# Patient Record
Sex: Female | Born: 1945 | Race: White | Hispanic: No | Marital: Married | Smoking: Former smoker
Health system: Southern US, Community
[De-identification: ages and names within clinical notes are randomized; demographics above are authoritative.]

## PROBLEM LIST (undated history)

## (undated) DIAGNOSIS — I739 Peripheral vascular disease, unspecified: Secondary | ICD-10-CM

## (undated) DIAGNOSIS — K219 Gastro-esophageal reflux disease without esophagitis: Secondary | ICD-10-CM

## (undated) DIAGNOSIS — C50919 Malignant neoplasm of unspecified site of unspecified female breast: Secondary | ICD-10-CM

## (undated) DIAGNOSIS — I701 Atherosclerosis of renal artery: Secondary | ICD-10-CM

## (undated) DIAGNOSIS — T4145XA Adverse effect of unspecified anesthetic, initial encounter: Secondary | ICD-10-CM

## (undated) DIAGNOSIS — C801 Malignant (primary) neoplasm, unspecified: Secondary | ICD-10-CM

## (undated) DIAGNOSIS — R32 Unspecified urinary incontinence: Secondary | ICD-10-CM

## (undated) DIAGNOSIS — I10 Essential (primary) hypertension: Secondary | ICD-10-CM

## (undated) DIAGNOSIS — Z8489 Family history of other specified conditions: Secondary | ICD-10-CM

## (undated) DIAGNOSIS — J45909 Unspecified asthma, uncomplicated: Secondary | ICD-10-CM

## (undated) DIAGNOSIS — Z923 Personal history of irradiation: Secondary | ICD-10-CM

## (undated) DIAGNOSIS — E785 Hyperlipidemia, unspecified: Secondary | ICD-10-CM

## (undated) DIAGNOSIS — IMO0001 Reserved for inherently not codable concepts without codable children: Secondary | ICD-10-CM

## (undated) DIAGNOSIS — N189 Chronic kidney disease, unspecified: Secondary | ICD-10-CM

## (undated) DIAGNOSIS — T8859XA Other complications of anesthesia, initial encounter: Secondary | ICD-10-CM

## (undated) DIAGNOSIS — IMO0002 Reserved for concepts with insufficient information to code with codable children: Secondary | ICD-10-CM

## (undated) HISTORY — DX: Malignant neoplasm of unspecified site of unspecified female breast: C50.919

## (undated) HISTORY — PX: ILIAC ARTERY STENT: SHX1786

## (undated) HISTORY — PX: OTHER SURGICAL HISTORY: SHX169

## (undated) HISTORY — PX: BLADDER SUSPENSION: SHX72

## (undated) HISTORY — DX: Peripheral vascular disease, unspecified: I73.9

## (undated) HISTORY — DX: Atherosclerosis of renal artery: I70.1

## (undated) HISTORY — PX: COLONOSCOPY: SHX174

## (undated) HISTORY — DX: Reserved for concepts with insufficient information to code with codable children: IMO0002

## (undated) HISTORY — PX: TUBAL LIGATION: SHX77

## (undated) HISTORY — DX: Hyperlipidemia, unspecified: E78.5

---

## 2000-06-18 ENCOUNTER — Encounter: Payer: Self-pay | Admitting: *Deleted

## 2000-06-18 ENCOUNTER — Other Ambulatory Visit: Admission: RE | Admit: 2000-06-18 | Discharge: 2000-06-18 | Payer: Self-pay | Admitting: *Deleted

## 2000-06-18 ENCOUNTER — Ambulatory Visit (HOSPITAL_COMMUNITY): Admission: RE | Admit: 2000-06-18 | Discharge: 2000-06-18 | Payer: Self-pay | Admitting: *Deleted

## 2000-07-27 ENCOUNTER — Observation Stay (HOSPITAL_COMMUNITY): Admission: RE | Admit: 2000-07-27 | Discharge: 2000-07-28 | Payer: Self-pay | Admitting: Cardiovascular Disease

## 2000-07-27 ENCOUNTER — Encounter: Payer: Self-pay | Admitting: Cardiovascular Disease

## 2000-07-27 HISTORY — PX: OTHER SURGICAL HISTORY: SHX169

## 2000-12-03 ENCOUNTER — Ambulatory Visit (HOSPITAL_COMMUNITY): Admission: RE | Admit: 2000-12-03 | Discharge: 2000-12-03 | Payer: Self-pay | Admitting: Cardiovascular Disease

## 2000-12-10 ENCOUNTER — Ambulatory Visit (HOSPITAL_COMMUNITY): Admission: RE | Admit: 2000-12-10 | Discharge: 2000-12-11 | Payer: Self-pay | Admitting: Cardiovascular Disease

## 2000-12-10 HISTORY — PX: OTHER SURGICAL HISTORY: SHX169

## 2001-10-14 ENCOUNTER — Ambulatory Visit (HOSPITAL_COMMUNITY): Admission: RE | Admit: 2001-10-14 | Discharge: 2001-10-14 | Payer: Self-pay | Admitting: Family Medicine

## 2001-10-14 ENCOUNTER — Encounter: Payer: Self-pay | Admitting: Family Medicine

## 2002-02-22 ENCOUNTER — Encounter: Payer: Self-pay | Admitting: Cardiovascular Disease

## 2002-02-22 ENCOUNTER — Ambulatory Visit (HOSPITAL_COMMUNITY): Admission: RE | Admit: 2002-02-22 | Discharge: 2002-02-23 | Payer: Self-pay | Admitting: Cardiovascular Disease

## 2002-02-22 HISTORY — PX: OTHER SURGICAL HISTORY: SHX169

## 2005-05-16 ENCOUNTER — Encounter: Admission: RE | Admit: 2005-05-16 | Discharge: 2005-05-16 | Payer: Self-pay | Admitting: Cardiovascular Disease

## 2005-05-22 ENCOUNTER — Ambulatory Visit (HOSPITAL_COMMUNITY): Admission: RE | Admit: 2005-05-22 | Discharge: 2005-05-22 | Payer: Self-pay | Admitting: Cardiovascular Disease

## 2005-05-22 HISTORY — PX: OTHER SURGICAL HISTORY: SHX169

## 2006-01-15 ENCOUNTER — Ambulatory Visit (HOSPITAL_COMMUNITY): Admission: RE | Admit: 2006-01-15 | Discharge: 2006-01-15 | Payer: Self-pay | Admitting: Family Medicine

## 2006-01-29 ENCOUNTER — Ambulatory Visit (HOSPITAL_COMMUNITY): Admission: RE | Admit: 2006-01-29 | Discharge: 2006-01-29 | Payer: Self-pay | Admitting: Family Medicine

## 2006-07-28 ENCOUNTER — Ambulatory Visit (HOSPITAL_COMMUNITY): Admission: RE | Admit: 2006-07-28 | Discharge: 2006-07-28 | Payer: Self-pay | Admitting: Cardiovascular Disease

## 2006-08-04 ENCOUNTER — Inpatient Hospital Stay (HOSPITAL_COMMUNITY): Admission: RE | Admit: 2006-08-04 | Discharge: 2006-08-05 | Payer: Self-pay | Admitting: Cardiovascular Disease

## 2006-08-04 HISTORY — PX: OTHER SURGICAL HISTORY: SHX169

## 2007-06-11 ENCOUNTER — Ambulatory Visit (HOSPITAL_COMMUNITY): Admission: RE | Admit: 2007-06-11 | Discharge: 2007-06-11 | Payer: Self-pay | Admitting: Family Medicine

## 2009-09-27 ENCOUNTER — Ambulatory Visit (HOSPITAL_COMMUNITY): Admission: RE | Admit: 2009-09-27 | Discharge: 2009-09-27 | Payer: Self-pay | Admitting: Internal Medicine

## 2010-03-21 HISTORY — PX: NM MYOVIEW LTD: HXRAD82

## 2010-06-04 NOTE — Cardiovascular Report (Signed)
NAME:  Debra Henderson, Debra Henderson NO.:  [id number]   MEDICAL RECORD NO.:  [id number]          PATIENT TYPE:  INP   LOCATION:  6527                         FACILITY:  [facility]   PHYSICIAN:  Nanetta Batty, M.D.   DATE OF BIRTH:  1945/02/03   DATE OF PROCEDURE:  08/04/2006  DATE OF DISCHARGE:                            CARDIAC CATHETERIZATION   [name] is a 65 year old white female with history of normal  coronaries by catheterization, renal artery stenosis, as well as left  iliac disease status post PTA and stenting of the left renal and iliac  artery in 2004.  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  Her other problems include  tobacco abuse, hypertension.  Followup renal Dopplers performed Jun 10, 2006, suggested in-stent restenosis with a smaller left kidney.  She  presents now for renal intervention for renal preservation.   DESCRIPTION OF PROCEDURE:  The patient brought to the sixth floor [facility] PV angiographic suite in the postabsorptive state.  She was  premedicated with p.o. Valium, IV Versed and fentanyl.  The right groin  was prepped and shaved in the usual sterile fashion, and 1% Xylocaine  was used local anesthesia.  A 6-French upgraded to a 7-French sheath was  inserted in the right femoral artery using standard Seldinger technique.  A 5-French tennis racket catheter was used for midstream distal  abdominal aortography.  Visipaque dye was used for the entirety of the  case.  The aortic pressures monitored during the case.   ANGIOGRAPHIC RESULTS:  1. Abdominal aorta.  2. Renal arteries:      a.     Right renal widely patent.      b.     left renal 90% in-stent restenosis.  3. Infrarenal abdominal aorta normal.  4. Iliac stent widely patent.   DESCRIPTION OF PROCEDURE:  The patient received 3000 units of heparin  intravenously.  Using a 7-French JR-4 short guide catheter along with an  0.14, 190 stabilizing  wire and a 4 x 10 cutting balloon, atherectomy was  performed.  Following this, a Cypher 3.5 x 13 drug-eluting stent was  then deployed at 18 atmospheres and postdilated with a 45 x 12 Quantum  Maverick at 18 atmospheres (4.7 mm) resulting in reduction of 90% in-  stent restenosis to 0% residual.  Since this was the second episode of  restenosis, it was elected to use a drug-eluting stent in order to  potentially maximize the potential for patency.   IMPRESSION:  Successful cutting balloon atherectomy and percutaneous  transluminal angioplasty and stenting of in-stent restenosis: within  the left renal artery stent for renal preservation.  The patient  tolerated procedure well.  She received 300 mg of Plavix at the end of  the case.  Guidewire and catheter were removed.  The sheath was sewn  securely in place.  The patient left the laboratory in satisfactory  condition.  Sheath will be removed once ACT falls below 200.  The  patient will be hydrated overnight, discharged home in the morning.  She  will get  followup renal Dopplers, and I will see back in the office  after that for further evaluation.      Nanetta Batty, M.D.  Electronically Signed     JB/MEDQ  D:  08/04/2006  T:  08/04/2006  Job:  [id number]   cc:   2nd Fl [facility] PV Angiographic Suite  [facility]  Patrica Duel, M.D.

## 2010-06-04 NOTE — Discharge Summary (Signed)
NAME:  Debra Henderson, Debra Henderson NO.:  [id number]   MEDICAL RECORD NO.:  [id number]          PATIENT TYPE:  INP   LOCATION:  6527                         FACILITY:  [facility]   PHYSICIAN:  Nanetta Batty, M.D.   DATE OF BIRTH:  18-May-1945   DATE OF ADMISSION:  08/04/2006  DATE OF DISCHARGE:  08/05/2006                               DISCHARGE SUMMARY   [name] is a 65 year old female patient of Dr. Nanetta Batty who  has known as PCP.  She has a history of normal coronary arteries.  She  has had renal stenting with in-stent restenosis and left iliac artery  stenting in the past.  Apparently recently her blood pressures have been  increasing.  Her renal Dopplers showed elevated velocities.  Thus, she  came in as an outpatient for PV angiogram.  This revealed that she had  90% in-stent restenosis on her left renal artery.  She underwent  percutaneous intervention and restenting with a 3.5 x 13 Cypher stent.  Result was 99% down to less than 0%.  She was hydrated overnight.  Plavix was continued as prior to hospitalization and July 16 started.  She was seen by Dr. Tresa Endo and she was considered stable for discharge  home.   LABORATORIES:  Hemoglobin 11.2, hematocrit 32.6, platelets 239.  WBC  9.1.  Sodium 136, potassium 3.6, BUN 12, creatinine 0.79.  Glucose is  133.  Chloride was 107.  CO2 was 24.   She has been on Vytorin and Lipitor in the past without tolerance.  We  discussed adding Crestor 5 mg low dose.  She is in agreement with this.   She continues to smoke.  We discussed adding Chantix.  She refused this  because of information she had heard about side effects.   DISCHARGE MEDICATIONS:  1. Plavix 75 mg a day.  2. Aspirin 325 mg a day.  3. Avalide 150/12.5 a day.  4. Crestor 5 mg a day.   She will have followup renal Dopplers July 29 at 7:00 a.m.  She will  have a followup with Dr. Allyson Sabal on August 4 at 10:15.   DISCHARGE DIAGNOSES:  1. Increasing hypertension  and elevated renal velocities with      subsequent recommendation for PV angiogram which was performed on      August 04, 2006 by Dr. Allyson Sabal showing 90% in-stent restenosis upper      left renal stent.  Her iliac stent on the left was patent.  She      underwent left percutaneous transluminal angioplasty and stenting      with a Cypher stent with good result.  2. Hyperlipidemia intolerant to Vytorin and Lipitor, started on      Crestor 5 mg every day.  3. Smoking.  This was discussed.  She refuses Chantix at this time.  4. Elevated glucose.  5. Hypertension.      [name], N.P.      Nanetta Batty, M.D.  Electronically Signed    BB/MEDQ  D:  08/05/2006  T:  08/05/2006  Job:  [id number]   cc:   Patrica Duel,  M.D. 

## 2010-06-07 NOTE — Discharge Summary (Signed)
NAME:  [name], [name] NO.:  [id number]   MEDICAL RECORD NO.:  [id number]                   PATIENT TYPE:  OIB   LOCATION:  6524                                 FACILITY:  [facility]   PHYSICIAN:  Nanetta Batty, M.D.                DATE OF BIRTH:  [birth year]   DATE OF ADMISSION:  02/22/2002  DATE OF DISCHARGE:  02/23/2002                                 DISCHARGE SUMMARY   ADMISSION DIAGNOSES:  1. Peripheral vascular disease.     a. Status post left common iliac artery percutaneous transluminal        angioplasty and stent.     b. Status post left renal artery percutaneous transluminal angioplasty        and stent.  2. Hypertension.  3. Hyperlipidemia.  4. Ongoing tobacco use.  5. Chest pain.  6. Recent Doppler consistent with in stent restenosis of left renal artery.   DISCHARGE DIAGNOSES:  1. Peripheral vascular disease.     a. Status post left common iliac artery percutaneous transluminal        angioplasty and stent.     b. Status post left renal artery percutaneous transluminal angioplasty        and stent.  2. Hypertension.  3. Hyperlipidemia.  4. Ongoing tobacco use.  5. Chest pain.  6. Recent Doppler consistent with in stent restenosis of left renal artery.  7. Cardiac catheterization, February 22, 2002, revealing normal coronary     arteries, normal LV function.  8. Status post peripheral vascular angiogram, February 22, 2002, revealing     95% in-stent restenosis of the left renal artery stent.  He performed PCI     successfully.  Previously placed left common iliac artery stent patent.   HISTORY OF PRESENT ILLNESS:  Debra Henderson is a 65 year old, mildly  overweight, white female with a history of PVD, hypertension,  hyperlipidemia, and ongoing tobacco use.  She has had a left common iliac  artery PTA and stent and left renal artery PTA and stent in the past.  Dr.  Allyson Sabal had not seen her for over a year.  When she came back to the  office on  February 18, 2002, she complained of exertional chest pain.  As well, recent  surveillance Doppler had findings consistent with in stent restenosis within  left renal artery stent with fairly normal ABIs.  On exam at that time she  was stable.  EKG showed nonspecific ST-T change.  At that point, Dr. Allyson Sabal  was concerned about her chest pain as well as restenosis of the left renal  artery; therefore, he planned for admission to [facility] in the following  week for diagnostic cardiac catheterization as well as abdominal aortography  to define her coronary anatomy and to potentially revascularize the left  kidney.  Risks and benefits of the procedure were discussed, and she was  willing to proceed.  HOSPITAL COURSE:  On February 22, 2002, Debra Henderson underwent cardiac  catheterization by Dr. Nanetta Batty.  She was found to have normal  coronary arteries and normal LV function.  On peripheral angiography she was  found to have 95% in stent restenosis of the left renal artery.  He  proceeded with angioplasty of this.  He successfully dilated this from 95%  to less than 20% residual.   On February 23, 2002, Debra Henderson remained stable.  She has had no  complications to her groin site.  She is hemodynamically stable with blood  pressure 100/60, heart rate 80.  She has no complaints, and her groin is  stable without hematoma or bleed.  At this time she __________  by Dr.  Nanetta Batty who deems her stable for discharge home.   HOSPITAL CONSULTS:  None.   HOSPITAL PROCEDURES:  1. Cardiac catheterization on February 22, 2002, with Dr. Nanetta Batty,     revealing normal coronary arteries, normal LV function.  2. Peripheral vascular angiography on February 22, 2002, with Dr. Nanetta Batty.  This revealed 95% in stent restenosis of the left renal artery     stent.  The previously placed left common iliac artery stent was patent.     He proceeded with PTA of the left renal  artery stent and this went from     95% to less than 20% residual.  She tolerated the procedure well with no     complications.   LABORATORY DATA:  Pre procedure labs were drawn as an outpatient and were  all stable.  Postprocedure labs on February 23, 2002, show sodium 139,  potassium 3.9, chloride 104, CO2 25, glucose 127, BUN 10, creatinine 0.9.  White count 8.9, hemoglobin 11.9, hematocrit 34.0, and platelet 242,000.   Telemetry was showing sinus rhythm with no arrhythmias.   DISCHARGE MEDICATIONS:  1. Plavix 75 mg once a day.  2. Aspirin 81 mg once a day.  3. Lipitor 10 mg once a day.  4. Avalide 150/12.5 once a day.  5. Wellbutrin SR 150 mg once a day.   DISCHARGE ACTIVITIES:  No strenuous activity, lift no greater than five  pounds, driving, or sexual activity for three days.   DISCHARGE DIET:  Low-salt, low-fat, low-cholesterol diet.   WOUND CARE:  1. May gently wash her groin site with warm water and soap.  2. Call [phone] for any bleeding or increased __________ of the groin site.    FOLLOW UP:  She is scheduled for follow-up carotid Dopplers as well as her  renal Dopplers on Friday, February 13, at 10:30 A.M. in the [place]  office.  She is scheduled to see Dr. Allyson Sabal March 10, at 10:00 A.M. in the  [place] office.     Mary B. Remer Macho, P.A.-C.                   Nanetta Batty, M.D.    MBE/MEDQ  D:  02/23/2002  T:  02/23/2002  Job:  [id number]   cc:   Nanetta Batty, M.D.  [address], [address]  [place]  [place] [address]  Fax: [phone]   Kirk Ruths, M.D.  [address]  [place]  [place] [address]  Fax: [phone]

## 2010-06-07 NOTE — Cardiovascular Report (Signed)
NAME:  Debra Henderson, Debra Henderson NO.:  [id number]   MEDICAL RECORD NO.:  [id number]                   PATIENT TYPE:  OIB   LOCATION:  2857                                 FACILITY:  [facility]   PHYSICIAN:  Nanetta Batty, M.D.                DATE OF BIRTH:  May 27, 1945   DATE OF PROCEDURE:  02/22/2002  DATE OF DISCHARGE:                              CARDIAC CATHETERIZATION   INDICATION:  The patient is a 65 year old white female with history of  peripheral vascular occlusive disease, hypertension, hyperlipidemia and  ongoing tobacco abuse.  She has had left renal and left common iliac artery  PTA and stenting.  She is complaining of chest pain.  She has had Dopplers  which suggest in-stent restenosis within the left renal artery stent.  She  presents now for cardiac catheterization, and abdominal aortography.   PROCEDURE DESCRIPTION:  The patient was brought to the second floor [facility] Coronary Cath Lab in a post-absorptive state.  She was premedicated  with p.o. Valium.  Her right groin was prepped and draped in the usual  sterile fashion.  Xylocaine 1% was used for local anesthesia.  A 6-French  sheath was inserted into the right femoral artery using standard Seldinger  technique.  The 6-French right and left Judkins diagnostic catheters along  with a 6-French pigtail catheter were used for selective coronary  angiography, left ventriculography, distal abdominal aortography and  selective left renal artery angiography.  Omnipaque dye was used for the  entirety of the case.  Retrograde aortic, ventricular and pullback pressures  were recorded.   HEMODYNAMICS:  Aortic systolic pressure 128, diastolic pressure 70.  Left  ventricular systolic pressure 130 and diastolic pressure 39.   SELECTIVE CORONARY ANGIOGRAPHY:  1. Left main normal.  2. LAD normal.  3. Left circumflex normal.  4. Right coronary artery is dominant and normal.   LEFT VENTRICULOGRAPHY:   RAO left ventriculogram was performed using 20 mL of  Omnipaque dye at 10 mL/sec.  The overall LVEF was estimated at greater than  60% without focal wall motion abnormalities.   DISTAL ABDOMINAL AORTOGRAPHY:  Performed twice using 20 mL of Omnipaque dye  at 20 mL/sec.  There appeared to be a high-grade in-stent left renal artery  stenosis.  The left common iliac artery stent was widely patent.   IMPRESSION:  Normal coronary arteries and normal left ventricular function.  The patient does have in-stent restenosis in the left renal artery stent.  We will proceed at this time with percutaneous transluminal angioplasty.   PROCEDURE DESCRIPTION:  The patient received 2500 units of heparin.  Selective angiography was performed via the left renal artery stent using a  6-French short right Judkins guide catheter.  A 0.014-inch support guidewire  was able to advance across the lesion and angioplasty was performed with the  5 x 15 Guidant rapid-exchange angioplasty balloon at 6 atmospheres,  resulting in reduction of a 95% in-stent restenosis to less than 20%  residual.  The patient did complain of some back pain, which quickly  resolved with balloon deflation.   OVERALL IMPRESSION:  Normal coronary arteries.  Normal left ventricular  function with successful percutaneous transluminal angioplasty of left renal  artery in-stent restenosis.  The patient tolerated the procedure well.  Activated clotting time was measured at greater than 200.  The guidewire and  catheters were removed and the  sheath was sewn securely in place.  Patient left the laboratory in stable  condition.  The sheath will be removed once the activated clotting time  falls below 200 and the patient will be discharged home in the morning.  She  left the laboratory in stable condition.                                               Nanetta Batty, M.D.    Cordelia Pen  D:  02/22/2002  T:  02/22/2002  Job:  [id number]   cc:   Second  Floor [facility] Cardiac Cath Lab   [address] [place] [place], [place]  [address] [facility]   Kirk Ruths, M.D.  [address]  [place]  [place] [address]  Fax: [phone]

## 2010-06-07 NOTE — Discharge Summary (Signed)
[place]  Patient:    Debra Henderson, Debra Henderson Visit Number: [id number] MRN: [id number]          Service Type: DSU Location: [phone] Attending Physician:  Berry, Jonathan [place] Dictated by:   [facility] [place], [place]. Admit Date:  12/10/2000 Discharge Date: 12/11/2000   CC:         [facility], [place]   Discharge Summary  ADMISSION DIAGNOSES: 1. Mild obesity. 2. Hypertension. 3. Hyperlipidemia. 4. Tobacco use. 5. History of angiography, July 27, 2000, revealing 95% segmental left common    iliac stenosis which was stented.  Also at angiography, she had bilateral    50% segmental, 50% mid superficial femoral artery stenosis, and    three-vessel runoff.  Also, that angiography showed a 95% ostial left renal    artery stenosis. 6. Known left renal artery stenosis by previous angiogram and worsening by    recent _______. 7. Status post Cardiolite November 10, 2000 which shows normal profusion,    ejection fraction 77%.  DISCHARGE DIAGNOSES: 1. Mild obesity. 2. Hypertension. 3. Hyperlipidemia. 4. Tobacco use. 5. History of angiography, July 27, 2000, revealing 95% segmental left common    iliac stenosis which was stented.  Also at angiography, she had bilateral    50% segmental, 50% mid superficial femoral artery stenosis, and    three-vessel runoff.  Also, that angiography showed a 95% ostial left renal    artery stenosis. 6. Known left renal artery stenosis by previous angiogram and worsening by    recent _______. 7. Status post Cardiolite November 10, 2000 which shows normal profusion,    ejection fraction 77%. 8. Status post peripheral vascular angiogram December 10, 2000 by Dr. Nanetta Batty which revealed a left renal artery stenosis of 90%.  It also revealed    80% left common iliac artery stenosis.  He performed percutaneous    transluminal angioplasty stent to both of these lesions with 0% residual in    both of  these at the end of the case.  HISTORY OF PRESENT ILLNESS:  The patient is a 65 year old, moderately overweight, white female with a history of hypertension, hyperlipidemia, and tobacco use.  She had been complaining of left lower extremity claudication and underwent angiography on July 27, 2000 revealing long 95% segmental left common iliac stenosis which was stented.  As well, she was found to have bilateral 50% segmental mid SFA stenoses with three-vessel runoff.  She was incidentally noted to have 95% ostial left renal artery stenosis.  Subsequent follow-up noninvasive studies revealed improvement in her ABIs.  Her claudication had completely resolved.  Renal Doppler studies showed left renal dimension of 8.9 cm which was 2 cm smaller than the right with a systolic velocity of 378.  At that time, it was felt that she had critical left renal artery stenosis though the blood pressure was well-controlled.  It was felt by Dr. Allyson Sabal that for renal preservation she should undergo percutaneous renal revascularization.  Though she denied having any chest pain or shortness of breath, he planned to obtain an exercise Cardiolite prior to angiography to rule out silent ischemic heart disease given her known significant PVD.  She would then be scheduled for renal intervention following her Cardiolite.  She subsequently underwent Cardiolite on November 10, 2000 which showed normal profusion and EF 77%.  HOSPITAL COURSE:  On December 10, 2000, the patient underwent peripheral vascular angiography by Dr. Nanetta Batty.  Please see his  dictated report for further details.  This is unavailable at this time of dictation.  She was found to have a left renal artery stenosis of 90%.  He performed PTA stenting and had a 0% residual stenosis at that site.  She also had an 80% left common iliac artery stenosis.  He performed PTA stent to this and also had a 0% residual at the end of case.  No complications.   He tolerated the procedure well.  On December 11, 2000, the patient remained stable.  She has no complaints. She is afebrile at 97%, pulse 80, blood pressure 95/60, and oxygen saturation 95% on room air.  Post-procedure labs are all stable with a creatinine at 0.9 post dye.  Her lungs are clear.  Heart is a regular rhythm.  Her right groin has no ecchymosis, no hematoma, and no bruit.  Left groin has a slight amount of ecchymosis but no hematoma or no bruit, and her peripheral pulses are equal bilaterally.  At this time, she is seen and evaluated by Dr. Chanda Busing who deems her stable for discharge home.  Of note, her lipid profile that was drawn with her pre-procedure labs revealed an LDL of 156.  She is currently on no lipid medication.  So, Lipitor 10 mg will be added to her discharge medications.  HOSPITAL CONSULTS:  None.  HOSPITAL PROCEDURE:  Peripheral vascular angiography on December 10, 2000, by Dr. Nanetta Batty.  She was found to have left renal artery stenosis of 90%. He performed PTA stent and had a 0% residual at the end of case.  As well, she was found to have an 80% left common iliac artery stenosis.  Again, he performed PTA stent, and at the end of case, she had a 0% residual at the site.  She tolerated the procedure well without complications.  Please see his dictated report for further detail as it is unavailable to me at this time of dictation.  DISCHARGE LABORATORY STUDIES:  Labs on December 11, 2000 showed a white count of 9.2, hemoglobin 11.5, hematocrit 33.5, platelets 260.  Sodium 142, potassium 4.0, BUN 10, creatinine 0.9, glucose 108.  Of note, her outpatient labs that had been drawn prior to procedure on December 03, 2000 showed a total cholesterol of 256, triglyceride 277, HDL 45, and LDL 156.  DISCHARGE MEDICATIONS: 1. Lipitor 10 mg once a day. 2. Enteric-coated aspirin 81 mg a day. 3. Plavix 75 mg once a day. 4. Avalide 150/12.5 once a  day. 5. Wellbutrin 150 mg b.i.d.   INSTRUCTIONS TO PATIENT: 1. No strenuous activity, lifting greater than 5 pounds, driving, or sexual    activity for three days. 2. Low salt, low fat, low cholesterol diet. 3. May gently wash her groin with warm water and soap. 4. Call our office at [phone] if any bleeding, increased size, or pain to    groin. 5. She is scheduled for follow-up Dopplers on December 22, 2000, at 8 a.m.    This is for renal Dopplers as well as lower extremity Dopplers.  She has an    appointment follow-up with Dr. Allyson Sabal December 25, 2000 at 3 p.m.  This is    in the [place] office.  She does state that she has seen Dr. Allyson Sabal both    in [place] and [place] in the past secondary to scheduling.  I am    going to keep her appointment here in [place].  Dictated by:   [facility] Attending  Physician:  Berry, Jonathan [place] DD:  12/11/00 TD:  12/12/00 Job: [id number]

## 2010-06-07 NOTE — Cardiovascular Report (Signed)
[place]  Patient:    Debra Henderson, Debra Henderson                      MRN: [id number] Proc. Date: 07/27/00 Adm. Date:  [id number] Attending:  Berry, Jonathan [place] CC:         Peripheral Vascular Angiographic Suite  Luciana Axe, M.D., [facility], [place]  [facility], [place] [address] [place], [place] [address]   Cardiac Catheterization  PROCEDURES PERFORMED:  Peripheral angiogram/percutaneous transluminal coronary angioplasty and stent.  INDICATIONS:  The patient is a 65 year old, moderately overweight, white female, mother of two daughters, grandmother to one grandchild, who was referred by Dr. Jodelle Green for evaluation of bilateral lower extremity claudication, left greater than right.  She has had then symptoms for years but these have become more noticeable and progressive over the last year or so making her unable to ambulate more than one block without stopping.  Her risk factor profile is positive for one-pack per day tobacco abuse times 20 years, hypertension and hyperlipidemia.  Her lower extremity Dopplers performed in our office revealed a right ankle-brachial index of 0.8 and a left of 0.5. She presents now for potential intervention.  DESCRIPTION OF PROCEDURE:  The patient was brought to the second floor cardiac catheterization lab in the postabsorptive state.  She was premedicated with p.o. Valium.  Her right groin was prepped and shaved in the usual sterile fashion.  Xylocaine 1% was used for local anesthesia.  A 5 French sheath was inserted into the right femoral artery using standard Seldinger technique.  A 5 Jamaica Tennis Racquet catheter, IMA catheters were used for mid stream and distal abdominal aortography, as well as bifemoral runoff.  Left renal artery angiography was also performed.  Visipaque dye was used for the entirety of the case.  Retrograde aortic pressure was monitored during  the case.  A transthoracic pressure gradient was noted across the left renal artery and left femoral iliac artery.  HEMODYNAMICS: 1. Abdominal aorta:    a. Renal arteries - 95% left renal artery stenosis.    b. Moderate infrarenal abdominal aortic atherosclerotic changes. 2. Left lower extremity:    a. A 95% segmental proximal and mid left common iliac artery       stenosis.    b. A 50% segmental mid left SFA.    c. Three-vessel runoff.  Of note was the fact that the left common femoral       artery was a small caliber vessel. 3. Right lower extremity:    a. A 50% segmental mid right SFA stenosis.    b. Three-vessel runoff.  DESCRIPTION OF PROCEDURE:  The left common femoral access was obtained under direct angiographic guidance using a standard Seldinger technique, 0.25 Wholey wire and a 7 French 35 mm long, Cordis right tip sheath.  Scout shots were obtained through the side-arm sheath.  With hand injection, there was retrograde dissection from the area of stenosis down to the hypogastric artery.  Pre-dilatation was performed using a 5 x 6 PowerFlex at 2-3 atmospheres.  Stenting was then performed using a 7 mm x 6 cm, long, Smart stent and post-dilatation with a 6 x 6 PowerFlex resulting in reduction of a 90-95% segment proximal to mid left common iliac artery stenosis to 0% residual.  There was slow outflow probably related to obstruction from the 7 French sheath.  A total of 200 mcg of intraarterial nitroglycerin was administered through  the side-arm sheath resulting in improvement in flow.  A final abdominal aortogram and hand injections were performed revealing no evidence of residual dissection and good distal flow.  OVERALL IMPRESSION:  Successful left common iliac artery percutaneous transluminal coronary angioplasty and stenting for relief of symptoms of claudication.  The patient has residual left renal artery stenosis.  An ACT was measured and the sheaths were removed.   Pressure was held on the groin to achieve hemostasis.  The patient left the lab in stable condition.  PLAN:  She will be hydrated overnight, discharged in the morning with followup lower extremity Dopplers, ABIs, and renal Dopplers.  She will see me back in the office in 2-3 weeks in  in followup.  Dr. Luciana Axe of [facility] was notified of these results.  The patient left the lab in stable condition. DD:  07/27/00 TD:  07/27/00 Job: [id number]

## 2010-06-07 NOTE — Cardiovascular Report (Signed)
[place]. [facility]  Patient:    Debra Henderson, Debra Henderson Visit Number: [id number] MRN: [id number]          Service Type: DSU Location: 4700 [id number] Attending Physician:  Berry, Jonathan [place] Dictated by:   Runell Gess, M.D. Proc. Date: 12/10/00 Admit Date:  12/10/2000   CC:         Sixth Floor [place] Peripheral Vascular Angiographic Suite  [facility] [address] [place] [address]  [facility], [place]   Cardiac Catheterization  PROCEDURE: 1. Renal percutaneous transluminal angioplasty and stent. 2. Left common iliac artery percutaneous transluminal angioplasty and stent.  CARDIOLOGIST:  Runell Gess, M.D.  INDICATIONS:  [name] is a 65 year old, moderately overweight white female with history of hypertension, hyperlipidemia, and tobacco abuse.  She had left lower extremity claudication and angiography with stenting of her left common iliac artery.  She was found to have a 95% left renal artery stenosis with high velocities noted on duplex.  She has obtained some improvement in her claudication symptoms.  She presents now for renal intervention for renal preservation.  DESCRIPTION OF PROCEDURE:  The patient was brought to the sixth floor [place] Peripheral Angiographic Suite in the postabsorptive state.  She was premedicated with p.o. Valium and IV Versed.  Her right groin was prepped and shaved in the usual sterile fashion.  Xylocaine 1% was used for local anesthesia.  A 6-French sheath was inserted into the right femoral artery using standard Seldinger technique.  A 5-French tennis racket catheter was used for midstream and distal abdominal aortography.  A 6-French short Judkins guide was used for renal intervention.  The patient received a total of 100 units of heparin intravenously.  The dry catheter engaged the left renal ostium demonstrating significant damping.  An 0.014  stabilizer wire was then passed across the lesion, and a 6 x 12 mm Genesis Aviator balloon/stent combination was then advanced through the guide catheter across the lesion.  It was deployed at 10 atmospheres resulting in reduction of 95% proximal left renal artery stenosis to 0% residual.  There was also noted to be an 80% near ostial left common iliac artery stenosis just proximal to the previously placed stent.  Ipsilateral access was obtained on the left side with a 7-French long Cordis sheath.  Wholey wire was passed across the lesion, and a P 12 x 4 mounted on a 7 x 2 Powerflex was then deployed primarily at 10 atmospheres resulting in reduction of 80% near ostial common iliac stenosis to 0% residual.  The patient tolerated the procedure well.  The ACT measured, and both sheaths were removed.  Pressure was held on each groin to achieve hemostasis.  The patient left the lab in stable condition.  She will be discharged home in the morning after being hydrated overnight.  We will obtain followup lower extremity Dopplers and ABIs as well as renal Doppler studies after which she will see me back in the office. Dictated by:   Runell Gess, M.D. Attending Physician:  Berry, Jonathan [place] DD:  12/10/00 TD:  12/10/00 Job: [id number]

## 2010-06-07 NOTE — Cardiovascular Report (Signed)
NAME:  Debra Henderson, Debra Henderson             ACCOUNT NO.:  [id number]   MEDICAL RECORD NO.:  [id number]          PATIENT TYPE:  AMB   LOCATION:  SDS                          FACILITY:  [facility]   PHYSICIAN:  Nanetta Batty, M.D.   DATE OF BIRTH:  1945/11/18   DATE OF PROCEDURE:  05/22/2005  DATE OF DISCHARGE:                              CARDIAC CATHETERIZATION   PERIPHERAL ANGIOGRAM/RENAL ARTERY PTA AND STENT PROCEDURE  [name] is a 65 year old female, history of left renal artery PTA and  left iliac PTA and stenting in the past.  Problems include continued tobacco  abuse, hyperlipidemia.  She had normal coronary arteries by catheterization  February 22, 2002.  A recent renal Doppler suggests in-stent restenosis.  The patient presents now for angiography and potential intervention for  renal preservation.   PROCEDURE DESCRIPTION:  The patient was brought to the second floor [facility] peripheral vascular angiographic suite in the post absorptive state.  She was premedicated with p.o. Valium.  Her right groin was prepped and  shaved in the usual sterile fashion.  Xylocaine 1% was used for local  anesthesia.  A 6-French sheath was inserted into the right femoral artery  using standard Seldinger technique.  A 5-French curved pigtail catheter was  used for abdominal aortography.  This pigtail was used for the entirety of  the case.  Retrograde aortic pressure was monitored during the case.   ANGIOGRAPHIC RESULTS:  1.  Abdominal aorta:      1.  Renal arteries - 80% left renal artery in-stent restenosis.      2.  Right renal is normal.      3.  Infrarenal abdominal aorta was free of significant disease.  2.  Left lower extremity:  The left common iliac artery stent is widely      patent.  3.  Right lower extremity:  Normal at the iliac level.   PROCEDURE DESCRIPTION:  The patient received 2500 units of heparin  intravenously.  Using a 6-French short right Judkins guide catheter with a  0.014  x 190 stabilizer wire and a 10 x 4 cutting balloon, atherectomy was  performed at nominal pressures.  Following this, PTA was performed using a 5  x 15 Aviator at nominal pressures resulting in reduction of 80% in-stent  restenosis to less than 20% residual without dissection and excellent flow.  The patient tolerated the procedure well.  The guide wire and catheter were  removed.  The ACT was measured and the sheath was removed.  Pressure was  held on the groin to achieve hemostasis.  The patient will be hydrated for 6  hours and remain on bedrest.  After that she will be discharged home as an  outpatient and we will arrange for her to get outpatient renal Dopplers in  our office early next week, after which time she will see me back in  followup.  She left the laboratory in stable condition.      Nanetta Batty, M.D.  Electronically Signed     JB/MEDQ  D:  05/22/2005  T:  05/23/2005  Job:  [id number]  cc:   2nd floor [place] cardiac cath lab   [facility] and Vascular   Patrica Duel, M.D.  Fax: [phone]

## 2010-11-04 LAB — CBC
HCT: 32.6 — ABNORMAL LOW
Hemoglobin: 11.2 — ABNORMAL LOW
MCHC: 34.2
MCV: 96.5
Platelets: 239
RBC: 3.38 — ABNORMAL LOW
RDW: 14.1 — ABNORMAL HIGH
WBC: 9.1

## 2010-11-04 LAB — BASIC METABOLIC PANEL
BUN: 12
CO2: 24
Calcium: 9.1
Chloride: 107
Creatinine, Ser: 0.79
GFR calc Af Amer: >60
GFR calc non Af Amer: >60
Glucose, Bld: 133 — ABNORMAL HIGH
Potassium: 3.6
Sodium: 136

## 2011-12-09 ENCOUNTER — Other Ambulatory Visit (HOSPITAL_COMMUNITY): Payer: Self-pay | Admitting: Cardiovascular Disease

## 2011-12-09 DIAGNOSIS — I739 Peripheral vascular disease, unspecified: Secondary | ICD-10-CM

## 2011-12-26 ENCOUNTER — Encounter (HOSPITAL_COMMUNITY): Payer: Self-pay

## 2011-12-30 ENCOUNTER — Other Ambulatory Visit (HOSPITAL_COMMUNITY): Payer: Self-pay | Admitting: Internal Medicine

## 2011-12-30 DIAGNOSIS — Z139 Encounter for screening, unspecified: Secondary | ICD-10-CM

## 2012-01-05 ENCOUNTER — Ambulatory Visit (HOSPITAL_COMMUNITY): Payer: Self-pay

## 2012-01-12 ENCOUNTER — Ambulatory Visit (HOSPITAL_COMMUNITY)
Admission: RE | Admit: 2012-01-12 | Discharge: 2012-01-12 | Disposition: A | Discharge status: Home / Self Care | Payer: 59 | Source: Ambulatory Visit | Attending: Internal Medicine | Admitting: Internal Medicine

## 2012-01-12 DIAGNOSIS — R928 Other abnormal and inconclusive findings on diagnostic imaging of breast: Secondary | ICD-10-CM | POA: Insufficient documentation

## 2012-01-12 DIAGNOSIS — Z139 Encounter for screening, unspecified: Secondary | ICD-10-CM

## 2012-01-12 DIAGNOSIS — Z1231 Encounter for screening mammogram for malignant neoplasm of breast: Secondary | ICD-10-CM | POA: Insufficient documentation

## 2012-01-16 ENCOUNTER — Ambulatory Visit (HOSPITAL_COMMUNITY)
Admission: RE | Admit: 2012-01-16 | Discharge: 2012-01-16 | Disposition: A | Discharge status: Home / Self Care | Payer: 59 | Source: Ambulatory Visit | Attending: Cardiovascular Disease | Admitting: Cardiovascular Disease

## 2012-01-16 DIAGNOSIS — I739 Peripheral vascular disease, unspecified: Secondary | ICD-10-CM

## 2012-01-16 NOTE — Progress Notes (Signed)
BLE arterial Duplex completed. Myalee Stengel D  

## 2012-01-20 ENCOUNTER — Other Ambulatory Visit: Payer: Self-pay | Admitting: Internal Medicine

## 2012-01-20 DIAGNOSIS — R928 Other abnormal and inconclusive findings on diagnostic imaging of breast: Secondary | ICD-10-CM

## 2012-01-21 DIAGNOSIS — C801 Malignant (primary) neoplasm, unspecified: Secondary | ICD-10-CM

## 2012-01-21 DIAGNOSIS — Z923 Personal history of irradiation: Secondary | ICD-10-CM

## 2012-01-21 HISTORY — DX: Personal history of irradiation: Z92.3

## 2012-01-21 HISTORY — DX: Malignant (primary) neoplasm, unspecified: C80.1

## 2012-01-26 ENCOUNTER — Other Ambulatory Visit: Payer: Self-pay | Admitting: Internal Medicine

## 2012-01-26 DIAGNOSIS — R928 Other abnormal and inconclusive findings on diagnostic imaging of breast: Secondary | ICD-10-CM

## 2012-02-09 ENCOUNTER — Encounter (HOSPITAL_COMMUNITY): Payer: Self-pay | Admitting: Pharmacy Technician

## 2012-02-13 ENCOUNTER — Ambulatory Visit
Admission: RE | Admit: 2012-02-13 | Discharge: 2012-02-13 | Disposition: A | Discharge status: Home / Self Care | Payer: 59 | Source: Ambulatory Visit | Attending: Internal Medicine | Admitting: Internal Medicine

## 2012-02-13 DIAGNOSIS — C50919 Malignant neoplasm of unspecified site of unspecified female breast: Secondary | ICD-10-CM

## 2012-02-13 DIAGNOSIS — R928 Other abnormal and inconclusive findings on diagnostic imaging of breast: Secondary | ICD-10-CM

## 2012-02-13 HISTORY — DX: Malignant neoplasm of unspecified site of unspecified female breast: C50.919

## 2012-02-13 HISTORY — PX: BREAST BIOPSY: SHX20

## 2012-02-16 ENCOUNTER — Other Ambulatory Visit: Payer: Self-pay | Admitting: Internal Medicine

## 2012-02-16 ENCOUNTER — Ambulatory Visit
Admission: RE | Admit: 2012-02-16 | Discharge: 2012-02-16 | Disposition: A | Discharge status: Home / Self Care | Payer: 59 | Source: Ambulatory Visit | Attending: Internal Medicine | Admitting: Internal Medicine

## 2012-02-16 DIAGNOSIS — R928 Other abnormal and inconclusive findings on diagnostic imaging of breast: Secondary | ICD-10-CM

## 2012-02-16 DIAGNOSIS — C50911 Malignant neoplasm of unspecified site of right female breast: Secondary | ICD-10-CM

## 2012-02-18 ENCOUNTER — Encounter (HOSPITAL_COMMUNITY): Payer: Self-pay | Admitting: Pharmacy Technician

## 2012-02-19 ENCOUNTER — Ambulatory Visit
Admission: RE | Admit: 2012-02-19 | Discharge: 2012-02-19 | Disposition: A | Discharge status: Home / Self Care | Payer: 59 | Source: Ambulatory Visit | Attending: Cardiovascular Disease | Admitting: Cardiovascular Disease

## 2012-02-19 ENCOUNTER — Other Ambulatory Visit: Payer: Self-pay | Admitting: Cardiovascular Disease

## 2012-02-19 ENCOUNTER — Ambulatory Visit
Admission: RE | Admit: 2012-02-19 | Discharge: 2012-02-19 | Disposition: A | Discharge status: Home / Self Care | Payer: 59 | Source: Ambulatory Visit | Attending: Internal Medicine | Admitting: Internal Medicine

## 2012-02-19 DIAGNOSIS — Z01818 Encounter for other preprocedural examination: Secondary | ICD-10-CM

## 2012-02-19 DIAGNOSIS — C50911 Malignant neoplasm of unspecified site of right female breast: Secondary | ICD-10-CM

## 2012-02-19 MED ORDER — GADOBENATE DIMEGLUMINE 529 MG/ML IV SOLN
13.0000 mL | Freq: Once | INTRAVENOUS | Status: AC | PRN
  Administered 2012-02-19: 13 mL via INTRAVENOUS

## 2012-02-20 ENCOUNTER — Ambulatory Visit (HOSPITAL_COMMUNITY): Payer: 59

## 2012-02-23 ENCOUNTER — Encounter (INDEPENDENT_AMBULATORY_CARE_PROVIDER_SITE_OTHER): Payer: Self-pay | Admitting: General Surgery

## 2012-02-23 ENCOUNTER — Ambulatory Visit (INDEPENDENT_AMBULATORY_CARE_PROVIDER_SITE_OTHER): Payer: 59 | Admitting: General Surgery

## 2012-02-23 VITALS — BP 128/70 | HR 68 | Temp 98.0°F | Resp 18 | Ht 60.0 in | Wt 141.0 lb

## 2012-02-23 DIAGNOSIS — D0590 Unspecified type of carcinoma in situ of unspecified breast: Secondary | ICD-10-CM

## 2012-02-23 DIAGNOSIS — D0510 Intraductal carcinoma in situ of unspecified breast: Secondary | ICD-10-CM | POA: Insufficient documentation

## 2012-02-23 NOTE — Progress Notes (Signed)
Subjective:     Patient ID: Debra Henderson, female   DOB: May 19, 1945, 67 y.o.   MRN: [id number]  HPI We're asked to see the patient in consultation by Dr. 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 The patient is a 67 year old white female who recently went for a routine screening mammogram. At that time she was found to have a small cluster of calcification in the 12:00 position of the right breast that was abnormal. 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 She denies any breast pain. She denies any discharge from her nipple. She does have a history of significant peripheral vascular disease with stents in her iliac artery and renal artery  Review of Systems  Constitutional: Negative.   HENT: Negative.   Eyes: Negative.   Respiratory: Negative.   Cardiovascular: Negative.   Gastrointestinal: Negative.   Genitourinary: Negative.   Musculoskeletal: Negative.   Skin: Negative.   Neurological: Negative.   Hematological: Negative.   Psychiatric/Behavioral: Negative.        Objective:   Physical Exam  Constitutional: She is oriented to person, place, and time. She appears well-developed and well-nourished.  HENT:  Head: Normocephalic and atraumatic.  Eyes: Conjunctivae normal and EOM are normal. Pupils are equal, round, and reactive to light.  Neck: Normal range of motion. Neck supple.  Cardiovascular: Normal rate, regular rhythm and normal heart sounds.   Pulmonary/Chest: Effort normal and breath sounds normal.       There is no palpable mass in either breast. There is no palpable axillary supraclavicular cervical lymphadenopathy.  Abdominal: Soft. Bowel sounds are normal. She exhibits no mass. There is no tenderness.  Musculoskeletal: Normal range of motion.  Lymphadenopathy:    She has no cervical adenopathy.  Neurological: She is alert and oriented to person, place, and time.  Skin: Skin is warm and dry.  Psychiatric: She has a normal mood and affect. Her behavior is  normal.       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 She is strongly ER and PR positive. I have discussed the options for treatment in detail with her and she would favor breast conservation. I think this is a reasonable choice for her. I have discussed with her in detail the risk and benefits of the operation to remove this area of the breast as well as some of the technical aspects and she understands and wishes to proceed    Plan:     Plan for right breast wire localized lumpectomy once we have cardiac clearance. Since the area is small and high she has elected to forego the sentinel node mapping at this time

## 2012-02-23 NOTE — Patient Instructions (Signed)
Plan for right breast wire localized lumpectomy after cardiac clearance

## 2012-02-24 ENCOUNTER — Encounter (HOSPITAL_COMMUNITY): Payer: Self-pay | Admitting: General Practice

## 2012-02-24 ENCOUNTER — Ambulatory Visit (HOSPITAL_COMMUNITY)
Admission: RE | Admit: 2012-02-24 | Discharge: 2012-02-25 | Disposition: A | Discharge status: Home / Self Care | Payer: 59 | Source: Ambulatory Visit | Attending: Cardiovascular Disease | Admitting: Cardiovascular Disease

## 2012-02-24 ENCOUNTER — Encounter (HOSPITAL_COMMUNITY)
Admission: RE | Disposition: A | Discharge status: Home / Self Care | Payer: Self-pay | Source: Ambulatory Visit | Attending: Cardiovascular Disease

## 2012-02-24 DIAGNOSIS — D0510 Intraductal carcinoma in situ of unspecified breast: Secondary | ICD-10-CM

## 2012-02-24 DIAGNOSIS — Y92009 Unspecified place in unspecified non-institutional (private) residence as the place of occurrence of the external cause: Secondary | ICD-10-CM | POA: Insufficient documentation

## 2012-02-24 DIAGNOSIS — I70219 Atherosclerosis of native arteries of extremities with intermittent claudication, unspecified extremity: Secondary | ICD-10-CM | POA: Insufficient documentation

## 2012-02-24 DIAGNOSIS — T82898A Other specified complication of vascular prosthetic devices, implants and grafts, initial encounter: Secondary | ICD-10-CM | POA: Insufficient documentation

## 2012-02-24 DIAGNOSIS — I739 Peripheral vascular disease, unspecified: Secondary | ICD-10-CM | POA: Diagnosis present

## 2012-02-24 DIAGNOSIS — Y849 Medical procedure, unspecified as the cause of abnormal reaction of the patient, or of later complication, without mention of misadventure at the time of the procedure: Secondary | ICD-10-CM | POA: Insufficient documentation

## 2012-02-24 HISTORY — DX: Essential (primary) hypertension: I10

## 2012-02-24 HISTORY — DX: Peripheral vascular disease, unspecified: I73.9

## 2012-02-24 HISTORY — DX: Chronic kidney disease, unspecified: N18.9

## 2012-02-24 HISTORY — PX: OTHER SURGICAL HISTORY: SHX169

## 2012-02-24 HISTORY — DX: Family history of other specified conditions: Z84.89

## 2012-02-24 HISTORY — DX: Adverse effect of unspecified anesthetic, initial encounter: T41.45XA

## 2012-02-24 HISTORY — PX: ATHERECTOMY: SHX5502

## 2012-02-24 HISTORY — PX: ANGIOPLASTY ILLIAC ARTERY: SHX5720

## 2012-02-24 HISTORY — DX: Malignant (primary) neoplasm, unspecified: C80.1

## 2012-02-24 HISTORY — DX: Other complications of anesthesia, initial encounter: T88.59XA

## 2012-02-24 LAB — POCT ACTIVATED CLOTTING TIME
Activated Clotting Time: 290 seconds
Activated Clotting Time: 318 seconds
Activated Clotting Time: 258 seconds
Activated Clotting Time: 176 seconds

## 2012-02-24 SURGERY — ATHERECTOMY
Anesthesia: LOCAL

## 2012-02-24 MED ORDER — HEPARIN (PORCINE) IN NACL 2-0.9 UNIT/ML-% IJ SOLN
INTRAMUSCULAR | Status: AC
  Filled 2012-02-24: qty 500

## 2012-02-24 MED ORDER — ATORVASTATIN CALCIUM 80 MG PO TABS: 80.0000 mg | ORAL_TABLET | Freq: Every day | ORAL | Status: DC

## 2012-02-24 MED ORDER — IRBESARTAN-HYDROCHLOROTHIAZIDE 150-12.5 MG PO TABS: 1.0000 | ORAL_TABLET | Freq: Every day | ORAL | Status: DC

## 2012-02-24 MED ORDER — SODIUM CHLORIDE 0.9 % IV SOLN
INTRAVENOUS | Status: DC
  Administered 2012-02-24: 12:00:00 via INTRAVENOUS

## 2012-02-24 MED ORDER — LIDOCAINE HCL (PF) 1 % IJ SOLN
INTRAMUSCULAR | Status: AC
  Filled 2012-02-24: qty 30

## 2012-02-24 MED ORDER — ASPIRIN 81 MG PO CHEW
CHEWABLE_TABLET | ORAL | Status: AC
  Filled 2012-02-24: qty 4

## 2012-02-24 MED ORDER — SODIUM CHLORIDE 0.9 % IJ SOLN: 3.0000 mL | INTRAMUSCULAR | Status: DC | PRN

## 2012-02-24 MED ORDER — OMEGA-3-ACID ETHYL ESTERS 1 G PO CAPS
1.0000 g | ORAL_CAPSULE | Freq: Two times a day (BID) | ORAL | Status: DC
  Administered 2012-02-24 – 2012-02-25 (×2): 1 g via ORAL
  Filled 2012-02-24 (×3): qty 1

## 2012-02-24 MED ORDER — ASPIRIN EC 325 MG PO TBEC
325.0000 mg | DELAYED_RELEASE_TABLET | Freq: Every day | ORAL | Status: DC
  Administered 2012-02-25: 10:00:00 325 mg via ORAL
  Filled 2012-02-24: qty 1

## 2012-02-24 MED ORDER — ACETAMINOPHEN 325 MG PO TABS: 650.0000 mg | ORAL_TABLET | ORAL | Status: DC | PRN

## 2012-02-24 MED ORDER — FENTANYL CITRATE 0.05 MG/ML IJ SOLN
INTRAMUSCULAR | Status: AC
  Filled 2012-02-24: qty 2

## 2012-02-24 MED ORDER — ASPIRIN EC 81 MG PO TBEC: 81.0000 mg | DELAYED_RELEASE_TABLET | Freq: Every day | ORAL | Status: DC

## 2012-02-24 MED ORDER — MIDAZOLAM HCL 2 MG/2ML IJ SOLN
INTRAMUSCULAR | Status: AC
  Filled 2012-02-24: qty 2

## 2012-02-24 MED ORDER — CLOPIDOGREL BISULFATE 75 MG PO TABS
75.0000 mg | ORAL_TABLET | Freq: Every day | ORAL | Status: DC
  Administered 2012-02-25: 75 mg via ORAL

## 2012-02-24 MED ORDER — HYDROCHLOROTHIAZIDE 12.5 MG PO CAPS
12.5000 mg | ORAL_CAPSULE | Freq: Every day | ORAL | Status: DC
  Administered 2012-02-25: 10:00:00 12.5 mg via ORAL
  Filled 2012-02-24: qty 1

## 2012-02-24 MED ORDER — IRBESARTAN 150 MG PO TABS
150.0000 mg | ORAL_TABLET | Freq: Every day | ORAL | Status: DC
  Administered 2012-02-25: 10:00:00 150 mg via ORAL
  Filled 2012-02-24: qty 1

## 2012-02-24 MED ORDER — HEPARIN (PORCINE) IN NACL 2-0.9 UNIT/ML-% IJ SOLN
INTRAMUSCULAR | Status: AC
  Filled 2012-02-24: qty 1000

## 2012-02-24 MED ORDER — CLOPIDOGREL BISULFATE 75 MG PO TABS
75.0000 mg | ORAL_TABLET | Freq: Every day | ORAL | Status: DC
Start: 2012-02-24 — End: 2012-02-24

## 2012-02-24 MED ORDER — NITROGLYCERIN 0.2 MG/ML ON CALL CATH LAB
INTRAVENOUS | Status: AC
  Filled 2012-02-24: qty 1

## 2012-02-24 MED ORDER — ONDANSETRON HCL 4 MG/2ML IJ SOLN: 4.0000 mg | Freq: Four times a day (QID) | INTRAMUSCULAR | Status: DC | PRN

## 2012-02-24 MED ORDER — SODIUM CHLORIDE 0.9 % IV SOLN: INTRAVENOUS | Status: AC

## 2012-02-24 MED ORDER — HEPARIN SODIUM (PORCINE) 1000 UNIT/ML IJ SOLN
INTRAMUSCULAR | Status: AC
  Filled 2012-02-24: qty 1

## 2012-02-24 MED ORDER — MORPHINE SULFATE 2 MG/ML IJ SOLN: 2.0000 mg | INTRAMUSCULAR | Status: DC | PRN

## 2012-02-24 NOTE — Progress Notes (Signed)
Site area: left groin  Site Prior to Removal:  Level 0  Pressure Applied For 35 MINUTES    Minutes Beginning at 1740  Manual:   yes  Patient Status During Pull:  AAO X3  Post Pull Groin Site:  Level 0  Post Pull Instructions Given:  yes  Post Pull Pulses Present:  yes  Dressing Applied:  yes  Comments:  Tolerated procedure well ,sheath pulled by Mariann Barter RN

## 2012-02-24 NOTE — CV Procedure (Signed)
Debra Henderson is a 67 y.o. female    [id number] LOCATION:  FACILITY: [facility]  PHYSICIAN: Nanetta Batty, M.D. [birth year]   DATE OF PROCEDURE:  02/24/2012  DATE OF DISCHARGE:  [facility]  PV Intervention    History obtained from chart review. Debra Henderson is a 67 year old mildly overweight married Caucasian female mother of 2 history of normal coronary arteries by cath performed by by me 02/22/2002. She has had stenting of her left renal artery as well as left iliac artery in the past as well. 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 She has had worsening left lower extremity claudication with Dopplers did show a decrease in her left ABI from 0.8 to the 0.68 as well as an high velocity signal within the stented segment suggesting "in-stent restenosis. She presents now for angiography and potential intervention for lifestyle limiting claudication   PROCEDURE DESCRIPTION:    The patient was brought to the second floor [place] Cardiac cath lab in the postabsorptive state. She was premedicated with Valium 5 mg by mouth, IV Versed and fentanyl.. Her left groinwas prepped and shaved in usual sterile fashion. Xylocaine 1% was used for local anesthesia. A 5 French sheath was inserted into the left common femoral artery using standard Seldinger technique. The patient received 5000 units  of heparin  intravenously.  A total of 73 cc of contrast was administered to the patient    HEMODYNAMICS:    AO SYSTOLIC/AO DIASTOLIC: 122/48    ANGIOGRAPHIC RESULTS:   1: abdominal aortogram-50% in-stent restenosis within the left renal artery stent with a 35 mm pullback gradient. The infrarenal abdominal aorta was moderately atherosclerotic.   2: Left lower extremity-80% proximal in-stent restenosis" within the left common iliac artery stent.  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 We will proceed with PTA and restenting using an iCast covered stent.  Procedure description: The patient was received 5000 units of Heparin intravenously the ACT measured up to 290. The 5 French sheath was exchanged  over a  0.35 guidewire for a 7 Jamaica Bright tip sheath. predilatation was performed with a 5 x 2 balloon, stenting with an 8 x 38 iCast Stent at 10 atmospheres. The final angiographic result was reduction of an 80% in-stent restenosis to 0% residual. A pullback gradient was performed using a 5 French angle catheter after administration of 200 mcg intra-arterial nitroglycerin across the distal edge of the previously placed stent revealing no gradient.  Final impression: Successful PTA and restenting of the in-stent restenosis" within the previously placed left common iliac artery stent using an iCast  covered stent with an excellent angiographic result. The sheath was secured in place and the patient left the Cath Lab in stable condition.she'll be gently hydrated, she will be discharged home in the morning on aspirin and Plavix. She'll be followed Dopplers after which she'll see me back.  Runell Gess MD, [facility] 02/24/2012 2:14 PM

## 2012-02-24 NOTE — H&P (Signed)
  H & P will be scanned in.  Pt was reexamined and existing H & P reviewed. No changes found.  Runell Gess, MD [facility] 02/24/2012 1:20 PM

## 2012-02-25 ENCOUNTER — Other Ambulatory Visit (HOSPITAL_COMMUNITY): Payer: Self-pay | Admitting: Physician Assistant

## 2012-02-25 DIAGNOSIS — I739 Peripheral vascular disease, unspecified: Secondary | ICD-10-CM

## 2012-02-25 LAB — CBC
HCT: 34 % — ABNORMAL LOW (ref 36.0–46.0)
Hemoglobin: 11.4 g/dL — ABNORMAL LOW (ref 12.0–15.0)
MCH: 32.3 pg (ref 26.0–34.0)
MCHC: 33.5 g/dL (ref 30.0–36.0)
MCV: 96.3 fL (ref 78.0–100.0)
Platelets: 240 10*3/uL (ref 150–400)
RBC: 3.53 MIL/uL — ABNORMAL LOW (ref 3.87–5.11)
RDW: 13.8 % (ref 11.5–15.5)
WBC: 9.9 10*3/uL (ref 4.0–10.5)

## 2012-02-25 LAB — BASIC METABOLIC PANEL
BUN: 11 mg/dL (ref 6–23)
CO2: 28 mEq/L (ref 19–32)
Calcium: 9 mg/dL (ref 8.4–10.5)
Chloride: 100 mEq/L (ref 96–112)
Creatinine, Ser: 0.68 mg/dL (ref 0.50–1.10)
GFR calc Af Amer: >90 mL/min (ref >90)
GFR calc non Af Amer: >90 mL/min (ref >90)
Glucose, Bld: 125 mg/dL — ABNORMAL HIGH (ref 70–99)
Potassium: 3.3 mEq/L — ABNORMAL LOW (ref 3.5–5.1)
Sodium: 136 mEq/L (ref 135–145)

## 2012-02-25 MED ORDER — ASPIRIN 325 MG PO TBEC: 325.0000 mg | DELAYED_RELEASE_TABLET | Freq: Every day | ORAL | Status: DC

## 2012-02-25 MED ORDER — POTASSIUM CHLORIDE CRYS ER 20 MEQ PO TBCR
40.0000 meq | EXTENDED_RELEASE_TABLET | Freq: Once | ORAL | Status: AC
  Administered 2012-02-25: 40 meq via ORAL
  Filled 2012-02-25: qty 2

## 2012-02-25 NOTE — Progress Notes (Signed)
Dressing removed and band aid applied to left groin.Level 1 soft bruising at site.

## 2012-02-25 NOTE — Progress Notes (Signed)
Subjective:  No Groin pain.  Objective:  Temp:  [97.4 F (36.3 C)-98.4 F (36.9 C)] 98.4 F (36.9 C) (02/05 0805) Pulse Rate:  [59-78] 73  (02/05 0810) Resp:  [14-23] 18  (02/05 0810) BP: (95-134)/(48-81) 111/51 mmHg (02/05 0810) SpO2:  [94 %-100 %] 95 % (02/05 0810) Weight:  [63.957 kg (141 lb)] 63.957 kg (141 lb) (02/04 1230) Weight change:   Intake/Output from previous day: 02/04 0701 - 02/05 0700 In: 608.8 [I.V.:608.8] Out: 2050 [Urine:2050]  Intake/Output from this shift: Total I/O In: 200 [P.O.:200] Out: -   Physical Exam: General appearance: alert, cooperative and no distress Neck: no adenopathy, no carotid bruit, no JVD, supple, symmetrical, trachea midline and thyroid not enlarged, symmetric, no tenderness/mass/nodules Lungs: clear to auscultation bilaterally Heart: regular rate and rhythm, S1, S2 normal, no murmur, click, rub or gallop Extremities: Mod ecchymosis Left groin Pulses: 2+ and symmetric  Lab Results: Results for orders placed during the hospital encounter of 02/24/12 (from the past 48 hour(s))  POCT ACTIVATED CLOTTING TIME     Status: Normal   Collection Time   02/24/12  1:41 PM      Component Value Range Comment   Activated Clotting Time 318     POCT ACTIVATED CLOTTING TIME     Status: Normal   Collection Time   02/24/12  2:01 PM      Component Value Range Comment   Activated Clotting Time 290     POCT ACTIVATED CLOTTING TIME     Status: Normal   Collection Time   02/24/12  2:40 PM      Component Value Range Comment   Activated Clotting Time 258     POCT ACTIVATED CLOTTING TIME     Status: Normal   Collection Time   02/24/12  4:57 PM      Component Value Range Comment   Activated Clotting Time 176     BASIC METABOLIC PANEL     Status: Abnormal   Collection Time   02/25/12  4:35 AM      Component Value Range Comment   Sodium 136  135 - 145 mEq/L    Potassium 3.3 (*) 3.5 - 5.1 mEq/L    Chloride 100  96 - 112 mEq/L    CO2 28  19 - 32 mEq/L    Glucose, Bld 125 (*) 70 - 99 mg/dL    BUN 11  6 - 23 mg/dL    Creatinine, Ser 8.11  0.50 - 1.10 mg/dL    Calcium 9.0  8.4 - 91.4 mg/dL    GFR calc non Af Amer >90  >90 mL/min    GFR calc Af Amer >90  >90 mL/min   CBC     Status: Abnormal   Collection Time   02/25/12  4:35 AM      Component Value Range Comment   WBC 9.9  4.0 - 10.5 K/uL    RBC 3.53 (*) 3.87 - 5.11 MIL/uL    Hemoglobin 11.4 (*) 12.0 - 15.0 g/dL    HCT 78.2 (*) 95.6 - 46.0 %    MCV 96.3  78.0 - 100.0 fL    MCH 32.3  26.0 - 34.0 pg    MCHC 33.5  30.0 - 36.0 g/dL    RDW 21.3  08.6 - 57.8 %    Platelets 240  150 - 400 K/uL     Imaging: Imaging results have been reviewed  Assessment/Plan:   1. Active Problems: 2.  * No active  hospital problems. *  3.   Time Spent Directly with Patient:  20 minutes  Length of Stay:  LOS: 1 day   S/P PTA and re-stenting of LCIA for ISR with iCast covered stent.  Labs OK except for K 3.3 (replete). D/C home on ASA and Plavix. LEA then ROV. She was recently diagnosed with breast CA and apparently will require a lumpectomy by Dr. Carolynne Edouard. Hopefully we can keep on Plavix for at least a month prior to holing for surgery.  Runell Gess 02/25/2012, 9:11 AM

## 2012-02-25 NOTE — Discharge Summary (Signed)
Physician Discharge Summary  Patient ID: Debra Henderson MRN: [id number] DOB/AGE: 03-16-1945 67 y.o.  Admit date: 02/24/2012 Discharge date: 02/25/2012  Admission Diagnoses:  Claudication/PAD  Discharge Diagnoses:  Principal Problem:  *Claudication in peripheral vascular disease.  Left lower extremity. Active Problems:  PAD (peripheral artery disease)   Discharged Condition: stable  Hospital Course:   Debra Henderson is a 67 year old mildly overweight married Caucasian female mother of 2 with a history of normal coronary arteries by cath performed by Dr. Allyson Sabal on 02/22/2002, breast cancer.   She had stenting of her left renal artery as well as left iliac artery in the past as well. 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 She has had worsening left lower extremity claudication with Dopplers did show a decrease in her left ABI from 0.8 to the 0.68 as well as an high velocity signal within the stented segment suggesting "in-stent restenosis. She presented for angiography and potential intervention for lifestyle limiting claudication.  The PV angiogram revealed 80% proximal in-stent restenosis within the left common iliac artery stent.  She then underwent successful PTA and restenting using an iCast covered stent with an excellent angiographic result.  Potassium was replaced.  She was continued on plavix and discharged the following day after being seen by Dr Allyson Sabal who felt she stable to do so.  Outpatient Needs:  1.  P2Y12  2.  She is also supposed to have a lumpectomy.  Can the oncologist wait a month to stop Plavix?  Consults: None  Significant Diagnostic Studies:   HEMODYNAMICS:  AO SYSTOLIC/AO DIASTOLIC: 122/48  ANGIOGRAPHIC RESULTS:  1: abdominal aortogram-50% in-stent restenosis within the left renal artery stent with a 35 mm pullback gradient. The infrarenal abdominal aorta was moderately atherosclerotic.  2:  Left lower extremity-80% proximal in-stent restenosis" within the left common iliac artery stent.  IMPRESSION:Debra Henderson has a high-grade in-stent restenosis within the left common iliac artery stent responsible for her decrease in ABI , increase in velocity by duplex, and claudication. We will proceed with PTA and restenting using an iCast covered stent.  Procedure description: The patient was received 5000 units of Heparin intravenously the ACT measured up to 290. The 5 French sheath was exchanged over a 0.35 guidewire for a 7 [place] Bright tip sheath. predilatation was performed with a 5 x 2 balloon, stenting with an 8 x 38 iCast Stent at 10 atmospheres. The final angiographic result was reduction of an 80% in-stent restenosis to 0% residual. A pullback gradient was performed using a 5 French angle catheter after administration of 200 mcg intra-arterial nitroglycerin across the distal edge of the previously placed stent revealing no gradient.    Final impression: Successful PTA and restenting of the in-stent restenosis" within the previously placed left common iliac artery stent using an iCast covered stent with an excellent angiographic result. The sheath was secured in place and the patient left the Cath Lab in stable condition.she'll be gently hydrated, she will be discharged home in the morning on aspirin and Plavix. She'll be followed Dopplers after which she'll see me back.  Debra Gess MD, [facility]  02/24/2012  2:14 PM  CBC    Component Value Date/Time   WBC 9.9 02/25/2012 0435   RBC 3.53* 02/25/2012 0435   HGB 11.4* 02/25/2012 0435   HCT 34.0* 02/25/2012 0435   PLT 240 02/25/2012 0435   MCV 96.3 02/25/2012 0435   MCH 32.3 02/25/2012 0435  MCHC 33.5 02/25/2012 0435   RDW 13.8 02/25/2012 0435    BMET    Component Value Date/Time   NA 136 02/25/2012 0435   K 3.3* 02/25/2012 0435   CL 100 02/25/2012 0435   CO2 28 02/25/2012 0435   GLUCOSE 125* 02/25/2012 0435   BUN 11 02/25/2012 0435   CREATININE 0.68  02/25/2012 0435   CALCIUM 9.0 02/25/2012 0435   GFRNONAA >90 02/25/2012 0435   GFRAA >90 02/25/2012 0435      Discharge Exam: Blood pressure 111/51, pulse 73, temperature 98.4 F (36.9 C), temperature source Oral, resp. rate 18, height 5' (1.524 m), weight 63.957 kg (141 lb), SpO2 95.00%.   Disposition: 01-Home or Self Care      Discharge Orders    Future Orders Please Complete By Expires   Diet - low sodium heart healthy      Increase activity slowly      Discharge instructions      Comments:   No lifting more than a half gallon of milk or driving for three days.       Medication List     As of 02/25/2012  3:48 PM    TAKE these medications         aspirin 325 MG EC tablet   Take 1 tablet (325 mg total) by mouth daily.      clopidogrel 75 MG tablet   Commonly known as: PLAVIX   Take 75 mg by mouth daily.      irbesartan-hydrochlorothiazide 150-12.5 MG per tablet   Commonly known as: AVALIDE   Take 1 tablet by mouth daily.      omega-3 acid ethyl esters 1 G capsule   Commonly known as: LOVAZA   Take 1 g by mouth 2 (two) times daily.      rosuvastatin 5 MG tablet   Commonly known as: CRESTOR   Take 5 mg by mouth daily.        Follow-up Information    Follow up with Abi Shoults, PA. (Our office will call you with the appt date and time.)    Contact information:   [address] [place] [address] [phone]          Signed: Wilburt Finlay 02/25/2012, 3:48 PM

## 2012-02-27 ENCOUNTER — Other Ambulatory Visit (INDEPENDENT_AMBULATORY_CARE_PROVIDER_SITE_OTHER): Payer: Self-pay | Admitting: General Surgery

## 2012-02-27 DIAGNOSIS — C50919 Malignant neoplasm of unspecified site of unspecified female breast: Secondary | ICD-10-CM

## 2012-03-16 ENCOUNTER — Ambulatory Visit (HOSPITAL_COMMUNITY)
Admission: RE | Admit: 2012-03-16 | Discharge: 2012-03-16 | Disposition: A | Discharge status: Home / Self Care | Payer: 59 | Source: Ambulatory Visit | Attending: Cardiovascular Disease | Admitting: Cardiovascular Disease

## 2012-03-16 DIAGNOSIS — I739 Peripheral vascular disease, unspecified: Secondary | ICD-10-CM

## 2012-03-16 NOTE — Progress Notes (Signed)
Left Lower Ext. Arterial Duplex Completed. Cina Klumpp D  

## 2012-03-22 ENCOUNTER — Telehealth (INDEPENDENT_AMBULATORY_CARE_PROVIDER_SITE_OTHER): Payer: Self-pay

## 2012-03-22 ENCOUNTER — Ambulatory Visit (HOSPITAL_COMMUNITY)
Admission: RE | Admit: 2012-03-22 | Discharge: 2012-03-22 | Disposition: A | Discharge status: Home / Self Care | Payer: 59 | Source: Ambulatory Visit | Attending: Cardiovascular Disease | Admitting: Cardiovascular Disease

## 2012-03-22 DIAGNOSIS — I739 Peripheral vascular disease, unspecified: Secondary | ICD-10-CM | POA: Insufficient documentation

## 2012-03-22 LAB — PLATELET INHIBITION P2Y12: Platelet Function  P2Y12: 118 [PRU] — ABNORMAL LOW (ref 194–418)

## 2012-03-22 NOTE — Telephone Encounter (Signed)
The patient called to report that she had an iliac stent put in on 2/4 but a new blockage was created.  She said Dr Allyson Sabal wants her to go ahead and proceed with her cancer surgery and then they will work on the blockage.  She is on Plavix and will need to stop it.  She is ready to schedule.  I told her Dr Allyson Sabal needs to fax [place] a note.

## 2012-03-24 ENCOUNTER — Encounter (INDEPENDENT_AMBULATORY_CARE_PROVIDER_SITE_OTHER): Payer: Self-pay

## 2012-03-24 NOTE — Telephone Encounter (Signed)
I refaxed letter to Dr Allyson Sabal asking for him to fax clearance. Will get scheduled once clearance is received.

## 2012-03-25 ENCOUNTER — Encounter (INDEPENDENT_AMBULATORY_CARE_PROVIDER_SITE_OTHER): Payer: Self-pay

## 2012-03-25 NOTE — Telephone Encounter (Signed)
Received clearance. Will send message to Carolynne Edouard to put orders in.

## 2012-03-29 ENCOUNTER — Other Ambulatory Visit (INDEPENDENT_AMBULATORY_CARE_PROVIDER_SITE_OTHER): Payer: Self-pay | Admitting: General Surgery

## 2012-03-29 ENCOUNTER — Telehealth (INDEPENDENT_AMBULATORY_CARE_PROVIDER_SITE_OTHER): Payer: Self-pay

## 2012-03-29 DIAGNOSIS — D0511 Intraductal carcinoma in situ of right breast: Secondary | ICD-10-CM

## 2012-03-29 NOTE — Telephone Encounter (Signed)
I called pt to let her know we received clearance and schedulers would be calling her in the next day or two. I also told her she needed to stop plavix, asa and fish oil 5 days before surgery.

## 2012-04-08 ENCOUNTER — Encounter (HOSPITAL_COMMUNITY): Payer: Self-pay | Admitting: Pharmacy Technician

## 2012-04-14 ENCOUNTER — Telehealth (INDEPENDENT_AMBULATORY_CARE_PROVIDER_SITE_OTHER): Payer: Self-pay

## 2012-04-14 NOTE — Telephone Encounter (Signed)
Pt calling asking what medications she needs to stop before surgery. I advised pt that she needs to stop her Plavix, aspirin, and fish oil 5 days before surgery. The pt understands.

## 2012-04-15 ENCOUNTER — Encounter (HOSPITAL_COMMUNITY): Payer: Self-pay

## 2012-04-15 ENCOUNTER — Encounter (HOSPITAL_COMMUNITY)
Admission: RE | Admit: 2012-04-15 | Discharge: 2012-04-15 | Disposition: A | Discharge status: Home / Self Care | Payer: Medicare Other | Source: Ambulatory Visit | Attending: General Surgery | Admitting: General Surgery

## 2012-04-15 HISTORY — DX: Gastro-esophageal reflux disease without esophagitis: K21.9

## 2012-04-15 LAB — BASIC METABOLIC PANEL
BUN: 5 mg/dL — ABNORMAL LOW (ref 6–23)
CO2: 30 mEq/L (ref 19–32)
Calcium: 9.6 mg/dL (ref 8.4–10.5)
Chloride: 98 mEq/L (ref 96–112)
Creatinine, Ser: 0.65 mg/dL (ref 0.50–1.10)
GFR calc Af Amer: >90 mL/min (ref >90)
GFR calc non Af Amer: >90 mL/min (ref >90)
Glucose, Bld: 81 mg/dL (ref 70–99)
Potassium: 3.6 mEq/L (ref 3.5–5.1)
Sodium: 135 mEq/L (ref 135–145)

## 2012-04-15 LAB — CBC
HCT: 35.7 % — ABNORMAL LOW (ref 36.0–46.0)
Hemoglobin: 12.2 g/dL (ref 12.0–15.0)
MCH: 32.7 pg (ref 26.0–34.0)
MCHC: 34.2 g/dL (ref 30.0–36.0)
MCV: 95.7 fL (ref 78.0–100.0)
Platelets: 297 10*3/uL (ref 150–400)
RBC: 3.73 MIL/uL — ABNORMAL LOW (ref 3.87–5.11)
RDW: 13.5 % (ref 11.5–15.5)
WBC: 10.1 10*3/uL (ref 4.0–10.5)

## 2012-04-15 LAB — SURGICAL PCR SCREEN
MRSA, PCR: NEGATIVE
Staphylococcus aureus: NEGATIVE

## 2012-04-15 NOTE — Pre-Procedure Instructions (Signed)
MARYANNE HUNEYCUTT  04/15/2012   Your procedure is scheduled on:  04-22-2012  Report [facility] when [facility]  Call this number if you have problems the morning of surgery: [phone]   Remember:   Do not eat food or drink liquids after midnight.   Take these medicines the morning of surgery with A SIP OF WATER: Ditropan-XL   Do not wear jewelry, make-up or nail polish.  Do not wear lotions, powders, or perfumes. You may wear deodorant.  Do not shave 48 hours prior to surgery.   Do not bring valuables to the hospital.  Contacts, dentures or bridgework may not be worn into surgery.    .   Patients discharged the day of surgery will not be allowed to drive home.   Name and number of person driving home__________________________   Special Instructions: Shower using CHG 2 nights before surgery and the night before surgery.  If you shower the day of surgery use CHG.  Use special wash - you have one bottle of CHG for all showers.  You should use approximately 1/3 of the bottle for each shower.     Please read over the following fact sheets that you were given: Pain Booklet and Surgical Site Infection Prevention

## 2012-04-15 NOTE — Progress Notes (Signed)
EKG,OV, and any cardiac testing requested from [facility].

## 2012-04-21 MED ORDER — VANCOMYCIN HCL IN DEXTROSE 1-5 GM/200ML-% IV SOLN
1000.0000 mg | INTRAVENOUS | Status: AC
  Administered 2012-04-22: 1000 mg via INTRAVENOUS
  Filled 2012-04-21: qty 200

## 2012-04-22 ENCOUNTER — Ambulatory Visit (HOSPITAL_COMMUNITY): Payer: Medicare Other | Admitting: Certified Registered"

## 2012-04-22 ENCOUNTER — Encounter (HOSPITAL_COMMUNITY): Payer: Self-pay | Admitting: Certified Registered"

## 2012-04-22 ENCOUNTER — Ambulatory Visit
Admission: RE | Admit: 2012-04-22 | Discharge: 2012-04-22 | Disposition: A | Discharge status: Home / Self Care | Payer: Medicare Other | Source: Ambulatory Visit | Attending: General Surgery | Admitting: General Surgery

## 2012-04-22 ENCOUNTER — Ambulatory Visit (HOSPITAL_COMMUNITY)
Admission: RE | Admit: 2012-04-22 | Discharge: 2012-04-22 | Disposition: A | Discharge status: Home / Self Care | Payer: Medicare Other | Source: Ambulatory Visit | Attending: General Surgery | Admitting: General Surgery

## 2012-04-22 ENCOUNTER — Encounter (HOSPITAL_COMMUNITY)
Admission: RE | Disposition: A | Discharge status: Home / Self Care | Payer: Self-pay | Source: Ambulatory Visit | Attending: General Surgery

## 2012-04-22 DIAGNOSIS — D0590 Unspecified type of carcinoma in situ of unspecified breast: Secondary | ICD-10-CM | POA: Insufficient documentation

## 2012-04-22 DIAGNOSIS — I708 Atherosclerosis of other arteries: Secondary | ICD-10-CM | POA: Insufficient documentation

## 2012-04-22 DIAGNOSIS — D0511 Intraductal carcinoma in situ of right breast: Secondary | ICD-10-CM

## 2012-04-22 DIAGNOSIS — Z170 Estrogen receptor positive status [ER+]: Secondary | ICD-10-CM | POA: Insufficient documentation

## 2012-04-22 DIAGNOSIS — I10 Essential (primary) hypertension: Secondary | ICD-10-CM | POA: Insufficient documentation

## 2012-04-22 DIAGNOSIS — K219 Gastro-esophageal reflux disease without esophagitis: Secondary | ICD-10-CM | POA: Insufficient documentation

## 2012-04-22 DIAGNOSIS — I701 Atherosclerosis of renal artery: Secondary | ICD-10-CM | POA: Insufficient documentation

## 2012-04-22 HISTORY — PX: BREAST LUMPECTOMY WITH NEEDLE LOCALIZATION: SHX5759

## 2012-04-22 SURGERY — BREAST LUMPECTOMY WITH NEEDLE LOCALIZATION
Anesthesia: General | Site: Breast | Laterality: Right | Wound class: Clean

## 2012-04-22 MED ORDER — CHLORHEXIDINE GLUCONATE 4 % EX LIQD: 1.0000 "application " | Freq: Once | CUTANEOUS | Status: DC

## 2012-04-22 MED ORDER — ONDANSETRON HCL 4 MG/2ML IJ SOLN
INTRAMUSCULAR | Status: DC | PRN
  Administered 2012-04-22: 4 mg via INTRAVENOUS

## 2012-04-22 MED ORDER — HYDROMORPHONE HCL PF 1 MG/ML IJ SOLN: 0.2500 mg | INTRAMUSCULAR | Status: DC | PRN

## 2012-04-22 MED ORDER — ONDANSETRON HCL 4 MG/2ML IJ SOLN: 4.0000 mg | Freq: Once | INTRAMUSCULAR | Status: DC | PRN

## 2012-04-22 MED ORDER — VANCOMYCIN HCL IN DEXTROSE 1-5 GM/200ML-% IV SOLN: 1000.0000 mg | INTRAVENOUS | Status: DC

## 2012-04-22 MED ORDER — MIDAZOLAM HCL 5 MG/5ML IJ SOLN
INTRAMUSCULAR | Status: DC | PRN
  Administered 2012-04-22: 2 mg via INTRAVENOUS

## 2012-04-22 MED ORDER — BUPIVACAINE-EPINEPHRINE 0.25% -1:200000 IJ SOLN
INTRAMUSCULAR | Status: AC
  Filled 2012-04-22: qty 1

## 2012-04-22 MED ORDER — ETOMIDATE 2 MG/ML IV SOLN
INTRAVENOUS | Status: DC | PRN
  Administered 2012-04-22: 16 mg via INTRAVENOUS

## 2012-04-22 MED ORDER — 0.9 % SODIUM CHLORIDE (POUR BTL) OPTIME
TOPICAL | Status: DC | PRN
  Administered 2012-04-22: 1000 mL

## 2012-04-22 MED ORDER — BUPIVACAINE-EPINEPHRINE 0.25% -1:200000 IJ SOLN
INTRAMUSCULAR | Status: DC | PRN
  Administered 2012-04-22: 20 mL

## 2012-04-22 MED ORDER — HYDROCODONE-ACETAMINOPHEN 5-325 MG PO TABS: 1.0000 | ORAL_TABLET | Freq: Four times a day (QID) | ORAL | Status: DC | PRN

## 2012-04-22 MED ORDER — LACTATED RINGERS IV SOLN
INTRAVENOUS | Status: DC
  Administered 2012-04-22: 11:00:00 via INTRAVENOUS

## 2012-04-22 MED ORDER — OXYCODONE HCL 5 MG/5ML PO SOLN
5.0000 mg | Freq: Once | ORAL | Status: DC | PRN
Start: 2012-04-22 — End: 2012-04-22

## 2012-04-22 MED ORDER — FENTANYL CITRATE 0.05 MG/ML IJ SOLN
INTRAMUSCULAR | Status: DC | PRN
  Administered 2012-04-22: 50 ug via INTRAVENOUS
  Administered 2012-04-22: 100 ug via INTRAVENOUS

## 2012-04-22 MED ORDER — ARTIFICIAL TEARS OP OINT
TOPICAL_OINTMENT | OPHTHALMIC | Status: DC | PRN
  Administered 2012-04-22: 1 via OPHTHALMIC

## 2012-04-22 MED ORDER — OXYCODONE HCL 5 MG PO TABS: 5.0000 mg | ORAL_TABLET | Freq: Once | ORAL | Status: DC | PRN

## 2012-04-22 MED ORDER — MEPERIDINE HCL 25 MG/ML IJ SOLN: 6.2500 mg | INTRAMUSCULAR | Status: DC | PRN

## 2012-04-22 SURGICAL SUPPLY — 48 items
ADH SKN CLS APL DERMABOND .7 (GAUZE/BANDAGES/DRESSINGS) ×1
APPLIER CLIP 9.375 MED OPEN (MISCELLANEOUS) ×2
APR CLP MED 9.3 20 MLT OPN (MISCELLANEOUS) ×1
BINDER BREAST LRG (GAUZE/BANDAGES/DRESSINGS) IMPLANT
BINDER BREAST XLRG (GAUZE/BANDAGES/DRESSINGS) IMPLANT
BLADE SURG 10 STRL SS (BLADE) ×2 IMPLANT
BLADE SURG 15 STRL LF DISP TIS (BLADE) ×1 IMPLANT
BLADE SURG 15 STRL SS (BLADE) ×2
CANISTER SUCTION 2500CC (MISCELLANEOUS) IMPLANT
CHLORAPREP W/TINT 26ML (MISCELLANEOUS) ×2 IMPLANT
CLIP APPLIE 9.375 MED OPEN (MISCELLANEOUS) IMPLANT
CLOTH BEACON ORANGE TIMEOUT ST (SAFETY) ×2 IMPLANT
CONT SPEC 4OZ CLIKSEAL STRL BL (MISCELLANEOUS) IMPLANT
COVER SURGICAL LIGHT HANDLE (MISCELLANEOUS) ×2 IMPLANT
DERMABOND ADVANCED (GAUZE/BANDAGES/DRESSINGS) ×1
DERMABOND ADVANCED .7 DNX12 (GAUZE/BANDAGES/DRESSINGS) ×1 IMPLANT
DEVICE DUBIN SPECIMEN MAMMOGRA (MISCELLANEOUS) ×2 IMPLANT
DRAPE CHEST BREAST 15X10 FENES (DRAPES) ×2 IMPLANT
DRAPE UTILITY 15X26 W/TAPE STR (DRAPE) ×4 IMPLANT
ELECT COATED BLADE 2.86 ST (ELECTRODE) ×2 IMPLANT
ELECT REM PT RETURN 9FT ADLT (ELECTROSURGICAL) ×2
ELECTRODE REM PT RTRN 9FT ADLT (ELECTROSURGICAL) ×1 IMPLANT
GLOVE BIO SURGEON STRL SZ7.5 (GLOVE) ×4 IMPLANT
GLOVE BIOGEL PI IND STRL 6.5 (GLOVE) IMPLANT
GLOVE BIOGEL PI IND STRL 7.5 (GLOVE) IMPLANT
GLOVE BIOGEL PI INDICATOR 6.5 (GLOVE) ×2
GLOVE BIOGEL PI INDICATOR 7.5 (GLOVE) ×2
GLOVE SURG SS PI 7.0 STRL IVOR (GLOVE) ×2 IMPLANT
GOWN STRL NON-REIN LRG LVL3 (GOWN DISPOSABLE) ×5 IMPLANT
KIT BASIN OR (CUSTOM PROCEDURE TRAY) ×2 IMPLANT
KIT MARKER MARGIN INK (KITS) IMPLANT
KIT ROOM TURNOVER OR (KITS) ×2 IMPLANT
NDL HYPO 25GX1X1/2 BEV (NEEDLE) ×1 IMPLANT
NEEDLE HYPO 25GX1X1/2 BEV (NEEDLE) ×2 IMPLANT
NS IRRIG 1000ML POUR BTL (IV SOLUTION) ×2 IMPLANT
PACK SURGICAL SETUP 50X90 (CUSTOM PROCEDURE TRAY) ×2 IMPLANT
PAD ARMBOARD 7.5X6 YLW CONV (MISCELLANEOUS) ×2 IMPLANT
PENCIL BUTTON HOLSTER BLD 10FT (ELECTRODE) ×2 IMPLANT
SPONGE LAP 18X18 X RAY DECT (DISPOSABLE) ×2 IMPLANT
SUT MON AB 4-0 PC3 18 (SUTURE) ×2 IMPLANT
SUT SILK 2 0 SH (SUTURE) IMPLANT
SUT VIC AB 3-0 SH 18 (SUTURE) ×2 IMPLANT
SYR BULB 3OZ (MISCELLANEOUS) ×2 IMPLANT
SYR CONTROL 10ML LL (SYRINGE) ×3 IMPLANT
TOWEL OR 17X24 6PK STRL BLUE (TOWEL DISPOSABLE) ×2 IMPLANT
TOWEL OR 17X26 10 PK STRL BLUE (TOWEL DISPOSABLE) ×2 IMPLANT
TUBE CONNECTING 12X1/4 (SUCTIONS) ×1 IMPLANT
YANKAUER SUCT BULB TIP NO VENT (SUCTIONS) ×1 IMPLANT

## 2012-04-22 NOTE — Anesthesia Postprocedure Evaluation (Signed)
Anesthesia Post Note  Patient: Debra Henderson  Procedure(s) Performed: Procedure(s) (LRB): RIGHT BREAST WIRE LOCALIZATION  LUMPECTOMY  (Right)  Anesthesia type: general  Patient location: PACU  Post pain: Pain level controlled  Post assessment: Patient's Cardiovascular Status Stable  Last Vitals:  Filed Vitals:   04/22/12 1330  BP: 113/56  Pulse: 65  Temp:   Resp: 19    Post vital signs: Reviewed and stable  Level of consciousness: sedated  Complications: No apparent anesthesia complications

## 2012-04-22 NOTE — H&P (Signed)
ZSOFIA PROUT  02/23/2012 4:00 PM   Office Visit  MRN:  [id number]   Description: 67 year old Debra Henderson  Provider: Robyne Askew, MD  Department: [facility]        Diagnoses    DCIS (ductal carcinoma in situ) of breast    -  Primary    233.0      Reason for Visit    New Evaluation    breast ca        Current Vitals - Last Recorded    BP Pulse Temp(Src) Resp Ht Wt    128/70 68 98 F (36.7 C) 18 5' (1.524 m) 141 lb (63.957 kg)       BMI              27.54 kg/m2                 Progress Notes    Robyne Askew, MD at 02/23/2012  4:33 PM    Status: Signed                   Subjective:       Patient ID: Debra Henderson, Debra Henderson   DOB: 07-06-1945, 4 y.o.   MRN: [id number]   HPI We're asked to see the patient in consultation by Dr. 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 The patient is a 67 year old white Debra Henderson who recently went for a routine screening mammogram. At that time she was found to have a small cluster of calcification in the 12:00 position of the right breast that was abnormal. 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 She denies any breast pain. She denies any discharge from her nipple. She does have a history of significant peripheral vascular disease with stents in her iliac artery and renal artery   Review of Systems  Constitutional: Negative.   HENT: Negative.   Eyes: Negative.   Respiratory: Negative.   Cardiovascular: Negative.   Gastrointestinal: Negative.   Genitourinary: Negative.   Musculoskeletal: Negative.   Skin: Negative.   Neurological: Negative.   Hematological: Negative.   Psychiatric/Behavioral: Negative.           Objective:     Physical Exam  Constitutional: She is oriented to person, place, and time. She appears well-developed and well-nourished.  HENT:   Head: Normocephalic and atraumatic.  Eyes: Conjunctivae normal and EOM are normal. Pupils are equal, round, and reactive to light.  Neck: Normal  range of motion. Neck supple.  Cardiovascular: Normal rate, regular rhythm and normal heart sounds.   Pulmonary/Chest: Effort normal and breath sounds normal.       There is no palpable mass in either breast. There is no palpable axillary supraclavicular cervical lymphadenopathy.  Abdominal: Soft. Bowel sounds are normal. She exhibits no mass. There is no tenderness.  Musculoskeletal: Normal range of motion.  Lymphadenopathy:    She has no cervical adenopathy.  Neurological: She is alert and oriented to person, place, and time.  Skin: Skin is warm and dry.  Psychiatric: She has a normal mood and affect. Her behavior is normal.          Assessment:       The patient appears to have a small area of DCIS in the 12:00 position of the right breast. She is strongly ER and PR positive. I have discussed the options for treatment in detail with her and she would favor breast conservation. I think this is  a reasonable choice for her. I have discussed with her in detail the risk and benefits of the operation to remove this area of the breast as well as some of the technical aspects and she understands and wishes to proceed     Plan:       Plan for right breast wire localized lumpectomy once we have cardiac clearance. Since the area is small and high she has elected to forego the sentinel node mapping at this time

## 2012-04-22 NOTE — Progress Notes (Signed)
Report given to Sharon RN.

## 2012-04-22 NOTE — Anesthesia Procedure Notes (Signed)
Procedure Name: LMA Insertion Date/Time: 04/22/2012 11:36 AM Performed by: Jefm Miles E Pre-anesthesia Checklist: Patient identified, Timeout performed, Emergency Drugs available, Suction available and Patient being monitored Patient Re-evaluated:Patient Re-evaluated prior to inductionOxygen Delivery Method: Circle system utilized Preoxygenation: Pre-oxygenation with 100% oxygen Intubation Type: IV induction Ventilation: Mask ventilation without difficulty LMA: LMA inserted LMA Size: 4.0 Number of attempts: 1 Placement Confirmation: positive ETCO2 and breath sounds checked- equal and bilateral Tube secured with: Tape Dental Injury: Teeth and Oropharynx as per pre-operative assessment

## 2012-04-22 NOTE — Op Note (Signed)
04/22/2012  12:19 PM  PATIENT:  Debra Henderson  67 y.o. female  PRE-OPERATIVE DIAGNOSIS:  right breast dcis  POST-OPERATIVE DIAGNOSIS:  right breast dcis  PROCEDURE:  Procedure(s): RIGHT BREAST WIRE LOCALIZATION  LUMPECTOMY  (Right)  SURGEON:  Surgeon(s) and Role:    * Robyne Askew, MD - Primary  PHYSICIAN ASSISTANT:   ASSISTANTS: none   ANESTHESIA:   general  EBL:     BLOOD ADMINISTERED:none  DRAINS: none   LOCAL MEDICATIONS USED:  MARCAINE     SPECIMEN:  Source of Specimen:  right breast tissue  DISPOSITION OF SPECIMEN:  PATHOLOGY  COUNTS:  YES  TOURNIQUET:  * No tourniquets in log *  DICTATION: .Dragon Dictation After informed consent was obtained the patient was brought to the operating room and placed in the supine position on the operating room table. After adequate induction of general anesthesia the patient's right breast was prepped with ChloraPrep, allowed to dry, and draped in usual sterile manner. Earlier in the day the patient underwent a wire localization procedure and the wire was entering the right breast in the 12:00 position and headed straight in. A transversely oriented curvilinear incision was made just beneath the entry site of the wire with a 15 blade knife. This incision was carried through the skin and subcutaneous tissue sharply with electrocautery until the breast tissue was entered. The path of the wire could be palpated. A circular portion of breast tissue was excised sharply around the path of the wire. This was done sharply with the electrocautery. Once the specimen was free it was removed from the patient and oriented according to the assigned pain colors. A specimen radiograph was obtained that showed the clip and wire to be in the center of the specimen. The specimen was then sent to pathology for further evaluation. Hemostasis was achieved using the Bovie electrocautery. The wound was irrigated with copious amounts of saline and  infiltrated with 1% Marcaine. The deep layer the wound was then closed with interrupted 3-0 Vicryl stitches. The skin was then closed with a running 4 Monocryl subcuticular stitch. Dermabond dressings were applied. The patient tolerated the procedure well. At the end of the case all needle sponge and instrument counts were correct. The patient was then awakened and taken to recovery in stable condition.  PLAN OF CARE: Discharge to home after PACU  PATIENT DISPOSITION:  PACU - hemodynamically stable.   Delay start of Pharmacological VTE agent (>24hrs) due to surgical blood loss or risk of bleeding: not applicable

## 2012-04-22 NOTE — Anesthesia Preprocedure Evaluation (Addendum)
Anesthesia Evaluation  Patient identified by MRN, date of birth, ID band Patient awake    Reviewed: Allergy & Precautions, H&P , NPO status , Patient's Chart, lab work & pertinent test results  History of Anesthesia Complications (+) Family history of anesthesia reaction  Airway Mallampati: I TM Distance: >3 FB Neck ROM: Full    Dental  (+) Teeth Intact, Missing and Dental Advisory Given   Pulmonary  breath sounds clear to auscultation        Cardiovascular hypertension, Pt. on medications Rhythm:Regular Rate:Normal     Neuro/Psych    GI/Hepatic GERD-  Medicated and Controlled,  Endo/Other    Renal/GU      Musculoskeletal   Abdominal   Peds  Hematology   Anesthesia Other Findings   Reproductive/Obstetrics                          Anesthesia Physical Anesthesia Plan  ASA: II  Anesthesia Plan: General   Post-op Pain Management:    Induction: Intravenous  Airway Management Planned: LMA  Additional Equipment:   Intra-op Plan:   Post-operative Plan: Extubation in OR  Informed Consent: I have reviewed the patients History and Physical, chart, labs and discussed the procedure including the risks, benefits and alternatives for the proposed anesthesia with the patient or authorized representative who has indicated his/her understanding and acceptance.     Plan Discussed with: CRNA and Surgeon  Anesthesia Plan Comments:         Anesthesia Quick Evaluation

## 2012-04-22 NOTE — Transfer of Care (Signed)
Immediate Anesthesia Transfer of Care Note  Patient: Debra Henderson  Procedure(s) Performed: Procedure(s): RIGHT BREAST WIRE LOCALIZATION  LUMPECTOMY  (Right)  Patient Location: PACU  Anesthesia Type:General  Level of Consciousness: awake and oriented  Airway & Oxygen Therapy: Patient Spontanous Breathing and Patient connected to nasal cannula oxygen  Post-op Assessment: Report given to PACU RN  Post vital signs: Reviewed and stable  Complications: No apparent anesthesia complications

## 2012-04-22 NOTE — Preoperative (Signed)
Beta Blockers   Reason not to administer Beta Blockers:Not Applicable 

## 2012-04-22 NOTE — Interval H&P Note (Signed)
History and Physical Interval Note:  04/22/2012 10:30 AM  Debra Henderson  has presented today for surgery, with the diagnosis of right breast dcis  The various methods of treatment have been discussed with the patient and family. After consideration of risks, benefits and other options for treatment, the patient has consented to  Procedure(s): RIGHT BREAST WIRE LOCALIZATION  LUMPECTOMY  (Right) as a surgical intervention .  The patient's history has been reviewed, patient examined, no change in status, stable for surgery.  I have reviewed the patient's chart and labs.  Questions were answered to the patient's satisfaction.     TOTH III,[name]

## 2012-04-23 ENCOUNTER — Encounter (HOSPITAL_COMMUNITY): Payer: Self-pay | Admitting: General Surgery

## 2012-04-30 ENCOUNTER — Telehealth: Payer: Self-pay | Admitting: *Deleted

## 2012-04-30 NOTE — Telephone Encounter (Signed)
Left message for pt to return my call so I can schedule a Med Onc appt. 

## 2012-05-02 HISTORY — PX: BREAST LUMPECTOMY: SHX2

## 2012-05-03 ENCOUNTER — Other Ambulatory Visit (INDEPENDENT_AMBULATORY_CARE_PROVIDER_SITE_OTHER): Payer: Self-pay

## 2012-05-03 DIAGNOSIS — D0511 Intraductal carcinoma in situ of right breast: Secondary | ICD-10-CM

## 2012-05-04 ENCOUNTER — Telehealth: Payer: Self-pay | Admitting: *Deleted

## 2012-05-04 NOTE — Telephone Encounter (Signed)
Pt returned my call and I confirmed 05/20/12 appt w/ pt.  Mailed before letter & packet to pt.  Emailed Clydie Braun for [employer].  Emailed Music therapist at [facility] to make her aware.

## 2012-05-10 ENCOUNTER — Encounter (INDEPENDENT_AMBULATORY_CARE_PROVIDER_SITE_OTHER): Payer: Self-pay | Admitting: General Surgery

## 2012-05-10 ENCOUNTER — Ambulatory Visit (INDEPENDENT_AMBULATORY_CARE_PROVIDER_SITE_OTHER): Payer: Medicare Other | Admitting: General Surgery

## 2012-05-10 ENCOUNTER — Encounter (INDEPENDENT_AMBULATORY_CARE_PROVIDER_SITE_OTHER): Payer: Self-pay

## 2012-05-10 VITALS — BP 130/62 | HR 76 | Temp 98.2°F | Resp 18 | Ht 60.0 in | Wt 144.4 lb

## 2012-05-10 DIAGNOSIS — D0511 Intraductal carcinoma in situ of right breast: Secondary | ICD-10-CM

## 2012-05-10 DIAGNOSIS — D0590 Unspecified type of carcinoma in situ of unspecified breast: Secondary | ICD-10-CM

## 2012-05-10 NOTE — Progress Notes (Signed)
Subjective:     Patient ID: Debra Henderson, female   DOB: 04-18-1945, 67 y.o.   MRN: [id number]  HPI The patient is a 67 year old white female who is 2 weeks status post right lumpectomy for DCIS. She had a single close posterior margin but there was no tumor on ink. She denies any breast pain.  Review of Systems     Objective:   Physical Exam On exam her right breast incision is healing nicely with no sign of infection or significant seroma.    Assessment:     The patient is 2 weeks status post right lumpectomy for DCIS     Plan:     At this point she will need to see the medical and radiation oncologist to talk about further adjuvant therapy. We will plan to see her back in about 3 months.

## 2012-05-10 NOTE — Patient Instructions (Signed)
Will refer to medical and radiation oncology 

## 2012-05-12 ENCOUNTER — Encounter: Payer: Self-pay | Admitting: *Deleted

## 2012-05-12 DIAGNOSIS — C50919 Malignant neoplasm of unspecified site of unspecified female breast: Secondary | ICD-10-CM | POA: Insufficient documentation

## 2012-05-12 NOTE — Progress Notes (Signed)
Location of Breast Cancer: right , upper outer  Histology per Pathology Report: DCIS, lobular neoplasia  Receptor Status: ER(+), PR (+), Her2-neu ()  Did patient present with symptoms (if so, please note symptoms) or was this found on screening mammography?: screening  Past/Anticipated interventions by surgeon, if any: right lumpectomy, 04/22/12  Past/Anticipated interventions by medical oncology, if any: new pt appt 05/20/12 Dr Welton Flakes  Lymphedema issues, if any:  none  Pain issues, if any:  denies  SAFETY ISSUES:  Prior radiation? no  Pacemaker/ICD? no  Possible current pregnancy? no  Is the patient on methotrexate? no  Current Complaints / other details:

## 2012-05-13 ENCOUNTER — Ambulatory Visit
Admission: RE | Admit: 2012-05-13 | Discharge: 2012-05-13 | Disposition: A | Discharge status: Home / Self Care | Payer: Medicare Other | Source: Ambulatory Visit | Attending: Radiation Oncology | Admitting: Radiation Oncology

## 2012-05-13 ENCOUNTER — Encounter: Payer: Self-pay | Admitting: Radiation Oncology

## 2012-05-13 VITALS — BP 124/70 | HR 76 | Temp 97.8°F | Resp 20 | Wt 145.0 lb

## 2012-05-13 DIAGNOSIS — Z9010 Acquired absence of unspecified breast and nipple: Secondary | ICD-10-CM | POA: Insufficient documentation

## 2012-05-13 DIAGNOSIS — Z170 Estrogen receptor positive status [ER+]: Secondary | ICD-10-CM | POA: Insufficient documentation

## 2012-05-13 DIAGNOSIS — D0590 Unspecified type of carcinoma in situ of unspecified breast: Secondary | ICD-10-CM | POA: Insufficient documentation

## 2012-05-13 DIAGNOSIS — D0511 Intraductal carcinoma in situ of right breast: Secondary | ICD-10-CM

## 2012-05-13 NOTE — Progress Notes (Signed)
Pt's husband w/her today, was homemaker, 2 children.  Pt states after her 02/2012 iliac stent she developed a "blockage in her groin". She states she was then diagnosed w/breast cancer. She states she is "to call dr as soon as she is ready to have balloon surgery for her groin blockage." Pt reports fatigue, denies pain, loss of appetite. She has post op soreness/tenderness of right breast.

## 2012-05-13 NOTE — Progress Notes (Addendum)
Radiation Oncology         [phone]) [phone] ________________________________  Initial outpatient Consultation - Date: 05/13/2012   Name: Debra Henderson MRN: [id number]   DOB: 02-Oct-1945  REFERRING PHYSICIAN: Robyne Askew, MD  DIAGNOSIS: The encounter diagnosis was DCIS (ductal carcinoma in situ) of breast, right.  HISTORY OF PRESENT ILLNESS::Debra Henderson is a 67 y.o. female  who underwent a screening mammogram and was found to have calcifications in the upper outer quadrant of the right breast. These were biopsied and found to be ductal carcinoma in situ. She saw surgery and underwent a lumpectomy on 04/22/2012 which showed low-grade ductal carcinoma in situ. There was a microscopic focus of 0.1 cm. This was focally close to the posterior margin at 0.1 cm. Atypical lobular hyperplasia was also noted. Estrogen and progesterone were both positive at 100%. She was referred to me for consideration of adjuvant radiation in the management of her DCIS. Her medical course was complicated by the fact that she requires angioplasty for her left leg. She would like to have this scheduled as soon as possible. She is scheduled to see medical oncology next week. She is done well since surgery. She has no breast pain.Marland Kitchen  PREVIOUS RADIATION THERAPY: No  PAST MEDICAL HISTORY:  has a past medical history of Peripheral vascular disease; Hypertension; Chronic kidney disease; Cancer (2014); Family history of anesthesia complication; Complication of anesthesia; GERD (gastroesophageal reflux disease); Breast cancer (02/13/12); Hyperlipidemia; and Claudication.    PAST SURGICAL HISTORY: Past Surgical History  Procedure Laterality Date  . Tubal ligation      1980's  . Fibroid breast      left breast-adenoma benign 1980's  . Iliac artery stent  11/21/19/02    left   . Stent /kidney  12/10/00,08/04/06    2002 R renal artery, 2008 L renal  . Bladder suspension  1980's  . Angioplasty illiac artery  02/24/2012   Dr Allyson Sabal  L iliac restent  . Breast lumpectomy with needle localization Right 04/22/2012    Procedure: RIGHT BREAST WIRE LOCALIZATION  LUMPECTOMY ;  Surgeon: Robyne Askew, MD;  Location: [facility] OR;  Service: General;  Laterality: Right;    FAMILY HISTORY: @[facility] @  SOCIAL HISTORY:  History  Substance Use Topics  . Smoking status: Former Smoker -- 1.00 packs/day    Types: Cigarettes    Quit date: 02/24/1988  . Smokeless tobacco: Never Used     Comment: quit 1990  . Alcohol Use: No    ALLERGIES: Erythromycin; Tetracyclines & related; Vytorin; Chicken allergy; Eggs or egg-derived products; Lipitor; Penicillins; and Sulfa antibiotics  MEDICATIONS:  Current Outpatient Prescriptions  Medication Sig Dispense Refill  . alum & mag hydroxide-simeth (MAALOX/MYLANTA) 200-200-20 MG/5ML suspension Take by mouth every 6 (six) hours as needed for indigestion.      Marland Kitchen aspirin EC 325 MG EC tablet Take 1 tablet (325 mg total) by mouth daily.  30 tablet    . clopidogrel (PLAVIX) 75 MG tablet Take 75 mg by mouth daily.      . fluocinonide cream (LIDEX) 0.05 % Apply 1 application topically daily as needed (for itching and skin irritations; uses it only once every two weeks as needed.).      Marland Kitchen irbesartan-hydrochlorothiazide (AVALIDE) 150-12.5 MG per tablet Take 1 tablet by mouth daily.      Marland Kitchen omega-3 acid ethyl esters (LOVAZA) 1 G capsule Take 1 g by mouth 2 (two) times daily.      Marland Kitchen oxybutynin (DITROPAN-XL)  5 MG 24 hr tablet Take 5 mg by mouth 2 (two) times daily.      . rosuvastatin (CRESTOR) 10 MG tablet Take 10 mg by mouth every evening.        No current facility-administered medications for this encounter.    REVIEW OF SYSTEMS:  A 15 point review of systems is documented in the electronic medical record. This was obtained by the nursing staff. However, I reviewed this with the patient to discuss relevant findings and make appropriate changes.  Pertinent items are noted in HPI.   PHYSICAL EXAM:  Filed  Vitals:   05/13/12 0956  BP: 124/70  Pulse: 76  Temp: 97.8 F (36.6 C)  Resp: 20  . He is a pleasant female in no distress sitting comfortably in a wheelchair. Her incision is healing well over her right breast. She appears older than her stated age.  LABORATORY DATA:  Lab Results  Component Value Date   WBC 10.1 04/15/2012   HGB 12.2 04/15/2012   HCT 35.7* 04/15/2012   MCV 95.7 04/15/2012   PLT 297 04/15/2012   Lab Results  Component Value Date   NA 135 04/15/2012   K 3.6 04/15/2012   CL 98 04/15/2012   CO2 30 04/15/2012   No results found for this basename: ALT, AST, GGT, ALKPHOS, BILITOT     RADIOGRAPHY: Mm Rt Plc Breast Loc Dev   1st Lesion  Inc Mammo Guide  04/22/2012  *RADIOLOGY REPORT*  Clinical Data:  Right breast cancer for surgical excision.  NEEDLE LOCALIZATION WITH MAMMOGRAPHIC GUIDANCE AND SPECIMEN RADIOGRAPH  Comparison:  Previous exams.  Patient presents for needle localization prior to right breast surgery.  I met with the patient and we discussed the procedure of needle localization including benefits and alternatives. We discussed the high likelihood of a successful procedure. We discussed the risks of the procedure, including infection, bleeding, tissue injury, and further surgery. Informed, written consent was given.  Using mammographic guidance, sterile technique, 2% lidocaine and a 7 cm modified Kopans needle, biopsy clip localized using a cranial approach.  The films are marked for Dr. Carolynne Edouard.  Specimen radiograph was performed at day surgery, and confirms biopsy clip, wire present in the tissue sample.  The specimen is marked for pathology.  IMPRESSION: Needle localization right breast.  No apparent complications.   Original Report Authenticated By: Sherian Rein, M.D.       IMPRESSION: DCIS of the right breast status post lumpectomy  PLAN: I discussed with the patient today the indication for radiation in the setting of DCIS. She does have this focally close margin and  would like her margins to be greater than 1 mm however it is focal and she is a very small focus of DCIS. I think it's prudent to perform a pretreatment mammogram and have ordered that. It also sounds as though her vascular disease needs to be addressed. Certainly her DCIS is not life threatening at this time and asked her to please get her angioplasty scheduled. We can simulate her prior to this procedure and that she have a week or so to recover. She would also like to be done with her treatment before a annual beach trip on June 21. I offered referral to [facility] however she would like to be treated here. We discussed the process of simulation the placement tattoos. We discussed 4-6 weeks of treatment as an outpatient. We discussed skin redness and fatigue as major side effects. We discussed where side effects  including rib fracture lung damage and secondary malignancies. In the end I given her our number and asked or call when she is ready to schedule simulation and has her angioplasty dates correct. I'm hopeful we can treat her in 4 weeks and she would be done in plenty of time to go to the beach.  I spent 40 minutes  face to face with the patient and more than 50% of that time was spent in counseling and/or coordination of care.   ------------------------------------------------  Lurline Hare, MD

## 2012-05-13 NOTE — Progress Notes (Signed)
Please see the Nurse Progress Note in the MD Initial Consult Encounter for this patient. 

## 2012-05-14 NOTE — Addendum Note (Signed)
Encounter addended by: Lurline Hare, MD on: 05/14/2012  9:29 AM<BR>     Documentation filed: Notes Section

## 2012-05-19 ENCOUNTER — Other Ambulatory Visit: Payer: Self-pay | Admitting: Medical Oncology

## 2012-05-19 ENCOUNTER — Telehealth: Payer: Self-pay | Admitting: *Deleted

## 2012-05-19 DIAGNOSIS — D0510 Intraductal carcinoma in situ of unspecified breast: Secondary | ICD-10-CM

## 2012-05-19 NOTE — Telephone Encounter (Signed)
Spoke w/pt to FU w/angioplasty. She states she "has put in a call but has not heard back" re: scheduling her angioplasty. Reminded pt to call this office when she has angioplasty scheduled so radiation treatment can be planned. Pt verbalized understanding.

## 2012-05-20 ENCOUNTER — Ambulatory Visit (HOSPITAL_BASED_OUTPATIENT_CLINIC_OR_DEPARTMENT_OTHER): Payer: Medicare Other | Admitting: Oncology

## 2012-05-20 ENCOUNTER — Telehealth: Payer: Self-pay | Admitting: Oncology

## 2012-05-20 ENCOUNTER — Encounter: Payer: Self-pay | Admitting: Oncology

## 2012-05-20 ENCOUNTER — Other Ambulatory Visit (HOSPITAL_BASED_OUTPATIENT_CLINIC_OR_DEPARTMENT_OTHER): Payer: Medicare Other | Admitting: Lab

## 2012-05-20 ENCOUNTER — Ambulatory Visit: Payer: Medicare Other

## 2012-05-20 VITALS — BP 120/69 | HR 75 | Temp 98.3°F | Resp 20 | Ht 60.0 in | Wt 143.2 lb

## 2012-05-20 DIAGNOSIS — D0511 Intraductal carcinoma in situ of right breast: Secondary | ICD-10-CM

## 2012-05-20 DIAGNOSIS — D0510 Intraductal carcinoma in situ of unspecified breast: Secondary | ICD-10-CM

## 2012-05-20 DIAGNOSIS — D0590 Unspecified type of carcinoma in situ of unspecified breast: Secondary | ICD-10-CM

## 2012-05-20 LAB — COMPREHENSIVE METABOLIC PANEL (CC13)
ALT: 10 U/L (ref 0–55)
AST: 10 U/L (ref 5–34)
Albumin: 3.4 g/dL — ABNORMAL LOW (ref 3.5–5.0)
Alkaline Phosphatase: 85 U/L (ref 40–150)
BUN: 7.1 mg/dL (ref 7.0–26.0)
CO2: 29 mEq/L (ref 22–29)
Calcium: 9.4 mg/dL (ref 8.4–10.4)
Chloride: 100 mEq/L (ref 98–107)
Creatinine: 0.8 mg/dL (ref 0.6–1.1)
Glucose: 94 mg/dl (ref 70–99)
Potassium: 3.9 mEq/L (ref 3.5–5.1)
Sodium: 137 mEq/L (ref 136–145)
Total Bilirubin: 0.3 mg/dL (ref 0.20–1.20)
Total Protein: 7.4 g/dL (ref 6.4–8.3)

## 2012-05-20 LAB — CBC WITH DIFFERENTIAL/PLATELET
BASO%: 0.3 % (ref 0.0–2.0)
Basophils Absolute: 0 10*3/uL (ref 0.0–0.1)
EOS%: 0.8 % (ref 0.0–7.0)
Eosinophils Absolute: 0.1 10*3/uL (ref 0.0–0.5)
HCT: 36.9 % (ref 34.8–46.6)
HGB: 12.4 g/dL (ref 11.6–15.9)
LYMPH%: 27.3 % (ref 14.0–49.7)
MCH: 32.7 pg (ref 25.1–34.0)
MCHC: 33.7 g/dL (ref 31.5–36.0)
MCV: 97.1 fL (ref 79.5–101.0)
MONO#: 0.8 10*3/uL (ref 0.1–0.9)
MONO%: 8.2 % (ref 0.0–14.0)
NEUT#: 6.1 10*3/uL (ref 1.5–6.5)
NEUT%: 63.4 % (ref 38.4–76.8)
Platelets: 297 10*3/uL (ref 145–400)
RBC: 3.8 10*6/uL (ref 3.70–5.45)
RDW: 13.7 % (ref 11.2–14.5)
WBC: 9.6 10*3/uL (ref 3.9–10.3)
lymph#: 2.6 10*3/uL (ref 0.9–3.3)

## 2012-05-20 NOTE — Patient Instructions (Addendum)
Proceed with radiation therapy  We discussed anti-estrogen to help prevent future breast cancers   Exemestane tablets What is this medicine? EXEMESTANE (ex e MES tane) blocks the production of the hormone estrogen. Some types of breast cancer depend on estrogen to grow, and this medicine can stop tumor growth by blocking estrogen production. This medicine is for the treatment of breast cancer in postmenopausal women only. This medicine may be used for other purposes; ask your health care provider or pharmacist if you have questions. What should I tell my health care provider before I take this medicine? They need to know if you have any of these conditions: -an unusual or allergic reaction to exemestane, other medicines, foods, dyes, or preservatives -pregnant or trying to get pregnant -breast-feeding How should I use this medicine? Take this medicine by mouth with a glass of water. Follow the directions on the prescription label. Take your doses at regular intervals after a meal. Do not take your medicine more often than directed. Do not stop taking except on the advice of your doctor or health care professional. Contact your pediatrician regarding the use of this medicine in children. Special care may be needed. Overdosage: If you think you have taken too much of this medicine contact a poison control center or emergency room at once. NOTE: This medicine is only for you. Do not share this medicine with others. What if I miss a dose? If you miss a dose, take the next dose as usual. Do not try to make up the missed dose. Do not take double or extra doses. What may interact with this medicine? Do not take this medicine with any of the following medications: -female hormones, like estrogens and birth control pills This medicine may also interact with the following medications: -androstenedione -phenytoin -rifabutin, rifampin, or rifapentine -St. John's Wort This list may not describe all  possible interactions. Give your health care provider a list of all the medicines, herbs, non-prescription drugs, or dietary supplements you use. Also tell them if you smoke, drink alcohol, or use illegal drugs. Some items may interact with your medicine. What should I watch for while using this medicine? Visit your doctor or health care professional for regular checks on your progress. If you experience hot flashes or sweating while taking this medicine, avoid alcohol, smoking and drinks with caffeine. This may help to decrease these side effects. What side effects may I notice from receiving this medicine? Side effects that you should report to your doctor or health care professional as soon as possible: -any new or unusual symptoms -changes in vision -fever -leg or arm swelling -pain in bones, joints, or muscles -pain in hips, back, ribs, arms, shoulders, or legs Side effects that usually do not require medical attention (report to your doctor or health care professional if they continue or are bothersome): -difficulty sleeping -headache -hot flashes -sweating -unusually weak or tired This list may not describe all possible side effects. Call your doctor for medical advice about side effects. You may report side effects to FDA at 1-800-FDA-1088. Where should I keep my medicine? Keep out of the reach of children. Store at room temperature between 15 and 30 degrees C (59 and 86 degrees F). Throw away any unused medicine after the expiration date. NOTE: This sheet is a summary. It may not cover all possible information. If you have questions about this medicine, talk to your doctor, pharmacist, or health care provider.  2013, Elsevier/Gold Standard. (05/11/2007 11:48:29 AM)

## 2012-05-20 NOTE — Progress Notes (Signed)
Checked in new pt with no financial concerns. °

## 2012-05-20 NOTE — Telephone Encounter (Signed)
, °

## 2012-05-20 NOTE — Progress Notes (Signed)
Debra Henderson [id number] [birth year] 67 y.o. 05/20/2012 3:33 PM  CC  Cassell Smiles., MD [address] [place] [address] Dr. Chevis Pretty Dr. Lurline Hare  REASON FOR CONSULTATION:  67 year old female with new diagnosis of right DCIS status post lumpectomy. Patient is seen for discussion of adjuvant treatment.  STAGE:   Right Breast DCIS  REFERRING PHYSICIAN: Dr. Chevis Pretty  HISTORY OF PRESENT ILLNESS:  Debra Henderson is a 67 y.o. female.  With multiple medical problems including peripheral vascular disease chronic kidney disease hypertension. Patient underwent a screening mammogram and was noted to have calcifications in the upper outer quadrant of the right breast. Biopsy showed ductal carcinoma in situ. Patient was seen by Dr. Carolynne Edouard and underwent a lumpectomy on 04/22/2012. The final pathology showed a low grade ductal carcinoma in situ with a 0.1 cm focus of disease. This was focally close to the posterior margin at 0.1 cm. Also noted was atypical lobular hyperplasia. The tumor was ER +100% PR +100%. Patient has been seen by radiation oncology for consideration of adjuvantradiation. She is now seen in medical oncology for discussion of adjuvant hormonal therapy. She is without any complaints she is accompanied by her husband.   Past Medical History: Past Medical History  Diagnosis Date  . Peripheral vascular disease   . Hypertension   . Chronic kidney disease     renal artery stenosis  . Cancer 2014    breast cancer  . Family history of anesthesia complication     my brother is difficult to put to sleep also  . Complication of anesthesia     ' It does not work " I am difficult to put tpo sleep  . GERD (gastroesophageal reflux disease)     otc  . Breast cancer 02/13/12    right upper outer- DCIS, ER/PR+  . Hyperlipidemia   . Claudication     lower extremities    Past Surgical History: Past Surgical History  Procedure Laterality Date   . Tubal ligation      1980's  . Fibroid breast      left breast-adenoma benign 1980's  . Iliac artery stent  11/21/19/02    left   . Stent /kidney  12/10/00,08/04/06    2002 R renal artery, 2008 L renal  . Bladder suspension  1980's  . Angioplasty illiac artery  02/24/2012    Dr Allyson Sabal  L iliac restent  . Breast lumpectomy with needle localization Right 04/22/2012    Procedure: RIGHT BREAST WIRE LOCALIZATION  LUMPECTOMY ;  Surgeon: Robyne Askew, MD;  Location: [facility] OR;  Service: General;  Laterality: Right;    Family History: Family History  Problem Relation Age of Onset  . Cancer Mother     colon  . COPD Mother   . Cancer Father     brian  . Stroke Sister   . Heart attack Sister     Social History History  Substance Use Topics  . Smoking status: Former Smoker -- 1.00 packs/day    Types: Cigarettes    Quit date: 02/24/1988  . Smokeless tobacco: Never Used     Comment: quit 1990  . Alcohol Use: No    Allergies: Allergies  Allergen Reactions  . Erythromycin   . Tetracyclines & Related Nausea And Vomiting and Other (See Comments)    Vomiting blood  . Vytorin (Ezetimibe-Simvastatin)     Per Dr Erlene Quan note  . Chicken Allergy Diarrhea and Rash  .  Eggs Or Egg-Derived Products Diarrhea and Rash  . Lipitor (Atorvastatin) Rash and Other (See Comments)    Myalgias   . Penicillins Swelling and Rash  . Sulfa Antibiotics Swelling and Rash    Current Medications: Current Outpatient Prescriptions  Medication Sig Dispense Refill  . alum & mag hydroxide-simeth (MAALOX/MYLANTA) 200-200-20 MG/5ML suspension Take by mouth every 6 (six) hours as needed for indigestion.      Marland Kitchen aspirin EC 325 MG EC tablet Take 1 tablet (325 mg total) by mouth daily.  30 tablet    . clopidogrel (PLAVIX) 75 MG tablet Take 75 mg by mouth daily.      . fluocinonide cream (LIDEX) 0.05 % Apply 1 application topically daily as needed (for itching and skin irritations; uses it only once every two weeks as  needed.).      Marland Kitchen irbesartan-hydrochlorothiazide (AVALIDE) 150-12.5 MG per tablet Take 1 tablet by mouth daily.      Marland Kitchen omega-3 acid ethyl esters (LOVAZA) 1 G capsule Take 1 g by mouth 2 (two) times daily.      Marland Kitchen oxybutynin (DITROPAN-XL) 5 MG 24 hr tablet Take 5 mg by mouth 2 (two) times daily.      . rosuvastatin (CRESTOR) 10 MG tablet Take 10 mg by mouth every evening.        No current facility-administered medications for this visit.    OB/GYN History: menarche at 82, menopause at 42, no hormone replacement, G2P2, first live birth 65  Fertility Discussion: N/A Prior History of Cancer: none  Health Maintenance:  Colonoscopy none Bone Density yes Last PAP smear yes  ECOG PERFORMANCE STATUS: 0 - Asymptomatic  Genetic Counseling/testing: none  REVIEW OF SYSTEMS:  Completed review of systems is scant separately into the electronic medical record  PHYSICAL EXAMINATION: Blood pressure 120/69, pulse 75, temperature 98.3 F (36.8 C), temperature source Oral, resp. rate 20, height 5' (1.524 m), weight 143 lb 3.2 oz (64.955 kg). Patient is well-developed well nourished female in no acute distress HEENT exam: EOMI PERRLA sclerae anicteric no conjunctival pallor oral mucosa is moist neck supple no adenopathy Lungs: Distant breath sounds Cardiovascular regular rate rhythm Abdomen: Soft nontender nondistended bowel sounds are present no HSM Extremities: Trace edema Neuro: No neuro focal deficits Breast examination: Right breast well-healed surgical scar no nipple discharge no other skin changes left breast no masses or nipple discharge.    STUDIES/RESULTS: Mm Rt Plc Breast Loc Dev   1st Lesion  Inc Mammo Guide  04/22/2012  *RADIOLOGY REPORT*  Clinical Data:  Right breast cancer for surgical excision.  NEEDLE LOCALIZATION WITH MAMMOGRAPHIC GUIDANCE AND SPECIMEN RADIOGRAPH  Comparison:  Previous exams.  Patient presents for needle localization prior to right breast surgery.  I met with  the patient and we discussed the procedure of needle localization including benefits and alternatives. We discussed the high likelihood of a successful procedure. We discussed the risks of the procedure, including infection, bleeding, tissue injury, and further surgery. Informed, written consent was given.  Using mammographic guidance, sterile technique, 2% lidocaine and a 7 cm modified Kopans needle, biopsy clip localized using a cranial approach.  The films are marked for Dr. Carolynne Edouard.  Specimen radiograph was performed at day surgery, and confirms biopsy clip, wire present in the tissue sample.  The specimen is marked for pathology.  IMPRESSION: Needle localization right breast.  No apparent complications.   Original Report Authenticated By: Sherian Rein, M.D.      LABS:    Chemistry  Component Value Date/Time   NA 135 04/15/2012 1500   K 3.6 04/15/2012 1500   CL 98 04/15/2012 1500   CO2 30 04/15/2012 1500   BUN 5* 04/15/2012 1500   CREATININE 0.65 04/15/2012 1500      Component Value Date/Time   CALCIUM 9.6 04/15/2012 1500      Lab Results  Component Value Date   WBC 9.6 05/20/2012   HGB 12.4 05/20/2012   HCT 36.9 05/20/2012   MCV 97.1 05/20/2012   PLT 297 05/20/2012   PATHOLOGY: Diagnosis Breast, lumpectomy, Right - DUCTAL CARCINOMA IN SITU, LOW GRADE, MICROSCOPIC FOCUS. - DUCTAL CARCINOMA IN SITU IS FOCALLY LESS THAN 0.1 CM FROM THE POSTERIOR MARGIN. - LOBULAR NEOPLASIA (ATYPICAL LOBULAR HYPERPLASIA). - SEE ONCOLOGY TABLE BELOW. Microscopic Comment BREAST, IN SITU CARCINOMA Specimen, including laterality: Right breast Procedure: Lumpectomy Grade of carcinoma: Low grade Necrosis: Not identified Estimated tumor size: (glass slide measurement): microscopic focus (less than 0.1 cm) Treatment effect: N/A Distance to closest margin: Focally less than 0.1 cm to posterior margin Breast prognostic profile: Case [id number] Estrogen receptor: 100%, strong staining intensity Progesterone  receptor: 100% strong staining intensity TNM: pTis, pNX (JBK:caf 04/23/12) Pecola Leisure MD Pathologist, Electronic Signature (Case signed 04/23/2012) Specimen Gross and Clinical Inf ASSESSMENT    67 year old female with  #1 new diagnosis of 0.1 cm DCIS of the right breast she is status post lumpectomy on 04/22/2012. The tumor was ER positive PR positive. Postoperatively she is doing well. She has been seen by Dr. Lurline Hare for consideration of adjuvant radiation therapy. She is now seen in medical oncology for possible adjuvant hormonal therapy since her tumor was 100% estrogen receptor positive.  #2 patient and I discussed role of adjuvant hormonal therapy as prevention for future breast cancers. We discussed the side effects risks and benefits. We discussed tamoxifen versus other agents such as an aromatase inhibitor and specifically Aromasin. I do think she would be at high risk for tamoxifen due to history of strokes and peripheral vascular disease. Therefore I would recommend an aromatase inhibitor such as Aromasin.  Clinical Trial Eligibility: yes Multidisciplinary conference discussion yes     PLAN:    #1 proceed with radiation therapy first.  #2 she will return after completion of radiation and we will discuss adjuvant antiestrogen therapy        Discussion: Patient is being treated per NCCN breast cancer care guidelines appropriate for stage.0   Thank you so much for allowing me to participate in the care of Debra Henderson. I will continue to follow up the patient with you and assist in her care.  All questions were answered. The patient knows to call the clinic with any problems, questions or concerns. We can certainly see the patient much sooner if necessary.  I spent 55 minutes counseling the patient face to face. The total time spent in the appointment was 60 minutes.  Drue Second, MD Medical/Oncology [facility] [phone]  (beeper) [phone] (Office)  05/20/2012, 3:33 PM

## 2012-05-21 ENCOUNTER — Telehealth: Payer: Self-pay | Admitting: Oncology

## 2012-05-21 ENCOUNTER — Encounter: Payer: Self-pay | Admitting: *Deleted

## 2012-05-21 NOTE — Progress Notes (Signed)
Mailed after appt letter to pt. 

## 2012-05-25 ENCOUNTER — Ambulatory Visit
Admission: RE | Admit: 2012-05-25 | Discharge: 2012-05-25 | Disposition: A | Discharge status: Home / Self Care | Payer: Medicare Other | Source: Ambulatory Visit | Attending: Radiation Oncology | Admitting: Radiation Oncology

## 2012-05-25 DIAGNOSIS — D0511 Intraductal carcinoma in situ of right breast: Secondary | ICD-10-CM

## 2012-05-28 ENCOUNTER — Encounter (HOSPITAL_COMMUNITY): Payer: Self-pay | Admitting: Pharmacy Technician

## 2012-05-31 ENCOUNTER — Other Ambulatory Visit: Payer: Self-pay | Admitting: *Deleted

## 2012-05-31 DIAGNOSIS — Z01810 Encounter for preprocedural cardiovascular examination: Secondary | ICD-10-CM

## 2012-05-31 NOTE — Addendum Note (Signed)
Addended by: Runell Gess on: 05/31/2012 04:37 PM   Modules accepted: Orders

## 2012-06-01 ENCOUNTER — Encounter (HOSPITAL_COMMUNITY)
Admission: RE | Disposition: A | Discharge status: Home / Self Care | Payer: Self-pay | Source: Ambulatory Visit | Attending: Cardiovascular Disease

## 2012-06-01 ENCOUNTER — Ambulatory Visit (HOSPITAL_COMMUNITY)
Admission: RE | Admit: 2012-06-01 | Discharge: 2012-06-01 | Disposition: A | Discharge status: Home / Self Care | Payer: Medicare Other | Source: Ambulatory Visit | Attending: Cardiovascular Disease | Admitting: Cardiovascular Disease

## 2012-06-01 DIAGNOSIS — Y831 Surgical operation with implant of artificial internal device as the cause of abnormal reaction of the patient, or of later complication, without mention of misadventure at the time of the procedure: Secondary | ICD-10-CM | POA: Insufficient documentation

## 2012-06-01 DIAGNOSIS — Z01810 Encounter for preprocedural cardiovascular examination: Secondary | ICD-10-CM

## 2012-06-01 DIAGNOSIS — D0590 Unspecified type of carcinoma in situ of unspecified breast: Secondary | ICD-10-CM | POA: Insufficient documentation

## 2012-06-01 DIAGNOSIS — I7092 Chronic total occlusion of artery of the extremities: Secondary | ICD-10-CM | POA: Insufficient documentation

## 2012-06-01 DIAGNOSIS — Z87891 Personal history of nicotine dependence: Secondary | ICD-10-CM | POA: Insufficient documentation

## 2012-06-01 DIAGNOSIS — I70219 Atherosclerosis of native arteries of extremities with intermittent claudication, unspecified extremity: Secondary | ICD-10-CM

## 2012-06-01 DIAGNOSIS — I10 Essential (primary) hypertension: Secondary | ICD-10-CM | POA: Insufficient documentation

## 2012-06-01 DIAGNOSIS — E785 Hyperlipidemia, unspecified: Secondary | ICD-10-CM | POA: Insufficient documentation

## 2012-06-01 DIAGNOSIS — Z809 Family history of malignant neoplasm, unspecified: Secondary | ICD-10-CM | POA: Insufficient documentation

## 2012-06-01 DIAGNOSIS — Z7902 Long term (current) use of antithrombotics/antiplatelets: Secondary | ICD-10-CM | POA: Insufficient documentation

## 2012-06-01 DIAGNOSIS — T82898A Other specified complication of vascular prosthetic devices, implants and grafts, initial encounter: Secondary | ICD-10-CM | POA: Insufficient documentation

## 2012-06-01 DIAGNOSIS — Z8249 Family history of ischemic heart disease and other diseases of the circulatory system: Secondary | ICD-10-CM | POA: Insufficient documentation

## 2012-06-01 DIAGNOSIS — Z79899 Other long term (current) drug therapy: Secondary | ICD-10-CM | POA: Insufficient documentation

## 2012-06-01 LAB — CBC
HCT: 35.7 % — ABNORMAL LOW (ref 36.0–46.0)
Hemoglobin: 12.5 g/dL (ref 12.0–15.0)
MCH: 32.5 pg (ref 26.0–34.0)
MCHC: 35 g/dL (ref 30.0–36.0)
MCV: 92.7 fL (ref 78.0–100.0)
Platelets: 304 10*3/uL (ref 150–400)
RBC: 3.85 MIL/uL — ABNORMAL LOW (ref 3.87–5.11)
RDW: 13.2 % (ref 11.5–15.5)
WBC: 10.8 10*3/uL — ABNORMAL HIGH (ref 4.0–10.5)

## 2012-06-01 LAB — BASIC METABOLIC PANEL
BUN: 11 mg/dL (ref 6–23)
CO2: 24 mEq/L (ref 19–32)
Calcium: 9.3 mg/dL (ref 8.4–10.5)
Chloride: 96 mEq/L (ref 96–112)
Creatinine, Ser: 0.61 mg/dL (ref 0.50–1.10)
GFR calc Af Amer: >90 mL/min (ref >90)
GFR calc non Af Amer: >90 mL/min (ref >90)
Glucose, Bld: 122 mg/dL — ABNORMAL HIGH (ref 70–99)
Potassium: 3.4 mEq/L — ABNORMAL LOW (ref 3.5–5.1)
Sodium: 133 mEq/L — ABNORMAL LOW (ref 135–145)

## 2012-06-01 LAB — PROTIME-INR
INR: 0.93 (ref 0.00–1.49)
Prothrombin Time: 12.4 seconds (ref 11.6–15.2)

## 2012-06-01 SURGERY — ANGIOGRAM EXTREMITY BILATERAL
Laterality: Bilateral

## 2012-06-01 MED ORDER — ACETAMINOPHEN 325 MG PO TABS: 650.0000 mg | ORAL_TABLET | ORAL | Status: DC | PRN

## 2012-06-01 MED ORDER — LIDOCAINE HCL (PF) 1 % IJ SOLN
INTRAMUSCULAR | Status: AC
  Filled 2012-06-01: qty 30

## 2012-06-01 MED ORDER — SODIUM CHLORIDE 0.9 % IV SOLN
INTRAVENOUS | Status: DC
  Administered 2012-06-01: 11:00:00 via INTRAVENOUS

## 2012-06-01 MED ORDER — DIAZEPAM 5 MG PO TABS: 5.0000 mg | ORAL_TABLET | ORAL | Status: AC

## 2012-06-01 MED ORDER — SODIUM CHLORIDE 0.9 % IJ SOLN: 3.0000 mL | INTRAMUSCULAR | Status: DC | PRN

## 2012-06-01 MED ORDER — ASPIRIN 81 MG PO CHEW: 324.0000 mg | CHEWABLE_TABLET | ORAL | Status: AC

## 2012-06-01 MED ORDER — ONDANSETRON HCL 4 MG/2ML IJ SOLN: 4.0000 mg | Freq: Four times a day (QID) | INTRAMUSCULAR | Status: DC | PRN

## 2012-06-01 MED ORDER — MORPHINE SULFATE 2 MG/ML IJ SOLN: 2.0000 mg | INTRAMUSCULAR | Status: DC | PRN

## 2012-06-01 MED ORDER — ASPIRIN 81 MG PO CHEW
CHEWABLE_TABLET | ORAL | Status: AC
  Administered 2012-06-01: 324 mg via ORAL
  Filled 2012-06-01: qty 4

## 2012-06-01 MED ORDER — DIAZEPAM 5 MG PO TABS
ORAL_TABLET | ORAL | Status: AC
  Administered 2012-06-01: 5 mg via ORAL
  Filled 2012-06-01: qty 1

## 2012-06-01 MED ORDER — MIDAZOLAM HCL 2 MG/2ML IJ SOLN
INTRAMUSCULAR | Status: AC
  Filled 2012-06-01: qty 2

## 2012-06-01 MED ORDER — FENTANYL CITRATE 0.05 MG/ML IJ SOLN
INTRAMUSCULAR | Status: AC
  Filled 2012-06-01: qty 2

## 2012-06-01 MED ORDER — SODIUM CHLORIDE 0.9 % IV SOLN: INTRAVENOUS | Status: DC

## 2012-06-01 NOTE — CV Procedure (Signed)
Debra Henderson is a 67 y.o. female    [id number] LOCATION:  FACILITY: [facility]  PHYSICIAN: Nanetta Batty, M.D. January 10, 1946   DATE OF PROCEDURE:  06/01/2012  DATE OF DISCHARGE:  [facility]  PV ANGIOGRAM     History obtained from chart review. Debra Henderson is a 67 year old Caucasian female patient of Dr. Artis Delay with history of normal coronary arteries per cardiac catheterization. She has known peripheral vaso-occlusive disease and has undergone stenting of her left iliac artery remotely with restenting at the origins such proximal portion for in-stent restenosis with an eye cast covered stent. Her other problems include discontinue tobacco use, hypertension and hyperlipidemia. Because of recurrent claudication she had Dopplers that showed a decrease in her left ABI 0.49 with what appears to be occlusion of her left common femoral artery. She presents now for angiography and potential intervention.   PROCEDURE DESCRIPTION:    The patient was brought to the second floor [facility] cath lab in the postabsorptive state. She was premedicated with Valium 5 mg by mouth, IV Versed and fentanyl. Her right and left legsWere prepped and shaved in usual sterile fashion. Xylocaine 1% was used for local anesthesia. A 5 French sheath was inserted into the right common femoral  artery using standard Seldinger technique. Attempts were made to access the left common femoral artery using a "smart needle" unsuccessfully.The 5 French pigtail catheter was used for a distal abdominal aortography with limited bilateral extremity angiography. Visipaque was used for the entirety of the case (78 cc). Retrograde aortic pressure was monitored during the case.     HEMODYNAMICS:    AO SYSTOLIC/AO DIASTOLIC: 138/57    ANGIOGRAPHIC RESULTS:   1: Left lower extremity-the stent was subtotally occluded in its distal edge trickle flow beyond down to the left common femoral which was  occluded over a short portion reconstituted at the common femoral/SFA/profunda trifurcation.  2: Right lower extremity-widely patent with 40% stenosis at the internal/external iliac bifurcation with diffuse segmental disease of the right external iliac artery in the 40% range.  IMPRESSION:Debra Henderson has an occluded left common iliac artery stent, subtotally occluded left external iliac artery and common femoral. She has a diffusely diseased right iliac system as well. The best option is aortobifemoral bypass grafting. The sheath was removed and pressure was held on the groin to achieve hemostasis. The patient left the lab in stable condition. She will be gently hydrated and discharged home today as an outpatient. She'll see me back in the office in one to 2 weeks.  Runell Gess MD, [facility] 06/01/2012 2:57 PM

## 2012-06-01 NOTE — H&P (Signed)
  H & P will be scanned in.  Pt was reexamined and existing H & P reviewed. No changes found.  Runell Gess, MD [facility] 06/01/2012 2:16 PM

## 2012-06-24 ENCOUNTER — Ambulatory Visit (INDEPENDENT_AMBULATORY_CARE_PROVIDER_SITE_OTHER): Payer: Medicare Other | Admitting: Cardiovascular Disease

## 2012-06-24 ENCOUNTER — Encounter: Payer: Self-pay | Admitting: Cardiovascular Disease

## 2012-06-24 VITALS — BP 100/60 | HR 80 | Ht 60.0 in | Wt 143.0 lb

## 2012-06-24 DIAGNOSIS — R0989 Other specified symptoms and signs involving the circulatory and respiratory systems: Secondary | ICD-10-CM

## 2012-06-24 DIAGNOSIS — I739 Peripheral vascular disease, unspecified: Secondary | ICD-10-CM

## 2012-06-24 MED ORDER — LOSARTAN POTASSIUM-HCTZ 50-12.5 MG PO TABS: 1.0000 | ORAL_TABLET | Freq: Every day | ORAL | Status: DC

## 2012-06-24 NOTE — Progress Notes (Signed)
06/24/2012 Debra Henderson   1945-11-26  [id number]  Primary Physician Cassell Smiles., MD Primary Cardiologist: Runell Gess MD [name]   HPI:  The patient is a 67 year old, mildly overweight Caucasian female with history of normal coronaries by catheterization performed by Dr. Allyson Sabal February 2004. She does have peripheral artery disease and he stented her left renal artery and left iliac artery also in 2004. In 2007 she was restudied and had documented in-stent restenosis within that left renal artery stent which was then subsequently restented. Her history also includes discontinued tobacco abuse, hypertension and hyperlipidemia. The patient had symptoms of worsening lower extremity claudication in 2013. At that time, she had lower extremity arterial Dopplers with right ABI of 0.78 and left of 0.82 and high-frequency signals in both iliac arteries left greater than right. The patient underwent PV angiogram which revealed 80% proximal in-stent restenosis within the left common iliac artery. She underwent successful PTA and stenting by Dr. Allyson Sabal with an iCAST covered stent with excellent results. She was continued on Plavix and discharged on February 25, 2012. Also of note, the patient has breast cancer and is scheduled to undergo a lumpectomy at some point in the future.followup Dopplers suggested occlusion of the left iliac system. She was re\re angiogram 06/01/12 revealed an occluded left external iliac and common femoral artery. She currently denies claudication.    Current Outpatient Prescriptions  Medication Sig Dispense Refill  . alum & mag hydroxide-simeth (MAALOX/MYLANTA) 200-200-20 MG/5ML suspension Take by mouth every 6 (six) hours as needed for indigestion.      Marland Kitchen aspirin EC 325 MG EC tablet Take 1 tablet (325 mg total) by mouth daily.  30 tablet    . clopidogrel (PLAVIX) 75 MG tablet Take 75 mg by mouth daily.      . fluocinonide cream (LIDEX) 0.05 % Apply 1  application topically daily as needed (for itching and skin irritations; uses it only once every two weeks as needed.).      Marland Kitchen irbesartan-hydrochlorothiazide (AVALIDE) 150-12.5 MG per tablet Take 1 tablet by mouth daily.      Marland Kitchen omega-3 acid ethyl esters (LOVAZA) 1 G capsule Take 1 g by mouth 2 (two) times daily.      Marland Kitchen oxybutynin (DITROPAN-XL) 5 MG 24 hr tablet Take 5 mg by mouth 2 (two) times daily.      . rosuvastatin (CRESTOR) 10 MG tablet Take 10 mg by mouth every evening.        No current facility-administered medications for this visit.    Allergies  Allergen Reactions  . Erythromycin   . Tetracyclines & Related Nausea And Vomiting and Other (See Comments)    Vomiting blood  . Vytorin (Ezetimibe-Simvastatin)     Per Dr Erlene Quan note  . Chicken Allergy Diarrhea and Rash  . Eggs Or Egg-Derived Products Diarrhea and Rash  . Lipitor (Atorvastatin) Rash and Other (See Comments)    Myalgias   . Penicillins Swelling and Rash  . Sulfa Antibiotics Swelling and Rash    History   Social History  . Marital Status: Married    Spouse Name: N/A    Number of Children: N/A  . Years of Education: N/A   Occupational History  . Not on file.   Social History Main Topics  . Smoking status: Former Smoker -- 1.00 packs/day    Types: Cigarettes    Quit date: 02/24/1988  . Smokeless tobacco: Never Used     Comment: quit 1990  . Alcohol  Use: No  . Drug Use: No  . Sexually Active: Yes     Comment: menarche age 77, P2, menopause 10, no HRT   Other Topics Concern  . Not on file   Social History Narrative  . No narrative on file     Review of Systems: General: negative for chills, fever, night sweats or weight changes.  Cardiovascular: negative for chest pain, dyspnea on exertion, edema, orthopnea, palpitations, paroxysmal nocturnal dyspnea or shortness of breath Dermatological: negative for rash Respiratory: negative for cough or wheezing Urologic: negative for  hematuria Abdominal: negative for nausea, vomiting, diarrhea, bright red blood per rectum, melena, or hematemesis Neurologic: negative for visual changes, syncope, or dizziness All other systems reviewed and are otherwise negative except as noted above.    Blood pressure 100/60, pulse 80, height 5' (1.524 m), weight 143 lb (64.864 kg).  General appearance: alert and no distress Neck: no adenopathy, no carotid bruit, no JVD, supple, symmetrical, trachea midline and thyroid not enlarged, symmetric, no tenderness/mass/nodules Lungs: clear to auscultation bilaterally Heart: regular rate and rhythm, S1, S2 normal, no murmur, click, rub or gallop Extremities: extremities normal, atraumatic, no cyanosis or edema  EKG not performed today  ASSESSMENT AND PLAN:   PAD (peripheral artery disease) The patient had stenting of her left renal artery and left iliac artery 2004. She was restudied in 2007 and had in-stent restenosis within the left renal artery stent which was then subsequently restented. She underwent re\re angiography revealing in-stent restenosis within the left iliac stent and had restenting with an eye cast covered stent with excellent result. She was discharged home 02/25/12. She'll followup Dopplers is suggested occlusion of her left iliac system and she was restudied 06/01/12 revealing occluded left external iliac and common femoral artery. Currently she denies claudication.      Runell Gess MD FACP,FACC,FAHA, [facility] 06/24/2012 4:19 PM

## 2012-06-24 NOTE — Patient Instructions (Addendum)
  Your physician wants you to follow-up with him in : 12 months                                            and with an extender in : 6 months                    You will receive a reminder letter in the mail one month in advance. If you don't receive a letter, please call our office to schedule the follow-up appointment.     Your physician has recommended you make the following change in your medication: stop the irbesartan, start losartan hct 50/12.5mg  daily   Your physician has ordered the following tests: carotid doppler

## 2012-06-24 NOTE — Assessment & Plan Note (Signed)
The patient had stenting of her left renal artery and left iliac artery 2004. She was restudied in 2007 and had in-stent restenosis within the left renal artery stent which was then subsequently restented. She underwent re\re angiography revealing in-stent restenosis within the left iliac stent and had restenting with an eye cast covered stent with excellent result. She was discharged home 02/25/12. She'll followup Dopplers is suggested occlusion of her left iliac system and she was restudied 06/01/12 revealing occluded left external iliac and common femoral artery. Currently she denies claudication.

## 2012-06-30 ENCOUNTER — Ambulatory Visit (HOSPITAL_COMMUNITY)
Admission: RE | Admit: 2012-06-30 | Discharge: 2012-06-30 | Disposition: A | Discharge status: Home / Self Care | Payer: Medicare Other | Source: Ambulatory Visit | Attending: Cardiovascular Disease | Admitting: Cardiovascular Disease

## 2012-06-30 DIAGNOSIS — R0989 Other specified symptoms and signs involving the circulatory and respiratory systems: Secondary | ICD-10-CM | POA: Insufficient documentation

## 2012-06-30 NOTE — Progress Notes (Signed)
Carotid Duplex Completed. Doyle Tegethoff, RDMS, RVT  

## 2012-07-13 ENCOUNTER — Encounter: Payer: Self-pay | Admitting: *Deleted

## 2012-07-22 ENCOUNTER — Ambulatory Visit
Admission: RE | Admit: 2012-07-22 | Discharge: 2012-07-22 | Disposition: A | Discharge status: Home / Self Care | Payer: Medicare Other | Source: Ambulatory Visit | Attending: Radiation Oncology | Admitting: Radiation Oncology

## 2012-07-22 DIAGNOSIS — D0511 Intraductal carcinoma in situ of right breast: Secondary | ICD-10-CM

## 2012-07-22 DIAGNOSIS — L988 Other specified disorders of the skin and subcutaneous tissue: Secondary | ICD-10-CM | POA: Insufficient documentation

## 2012-07-22 DIAGNOSIS — Z7982 Long term (current) use of aspirin: Secondary | ICD-10-CM | POA: Insufficient documentation

## 2012-07-22 DIAGNOSIS — N390 Urinary tract infection, site not specified: Secondary | ICD-10-CM | POA: Insufficient documentation

## 2012-07-22 DIAGNOSIS — C50919 Malignant neoplasm of unspecified site of unspecified female breast: Secondary | ICD-10-CM | POA: Insufficient documentation

## 2012-07-22 DIAGNOSIS — Y842 Radiological procedure and radiotherapy as the cause of abnormal reaction of the patient, or of later complication, without mention of misadventure at the time of the procedure: Secondary | ICD-10-CM | POA: Insufficient documentation

## 2012-07-22 DIAGNOSIS — Z79899 Other long term (current) drug therapy: Secondary | ICD-10-CM | POA: Insufficient documentation

## 2012-07-22 DIAGNOSIS — Z7902 Long term (current) use of antithrombotics/antiplatelets: Secondary | ICD-10-CM | POA: Insufficient documentation

## 2012-07-22 DIAGNOSIS — Z510 Encounter for antineoplastic radiation therapy: Secondary | ICD-10-CM | POA: Insufficient documentation

## 2012-07-22 NOTE — Progress Notes (Signed)
Name: Debra Henderson   MRN: [id number]  Date:  07/22/2012  DOB: 05-14-1945  Status:outpatient    DIAGNOSIS: Breast cancer.  CONSENT VERIFIED: yes   SET UP: Patient is setup supine   IMMOBILIZATION:  The following immobilization was used:Custom Moldable Pillow, breast board.   NARRATIVE: [name] was brought to the CT Simulation planning suite.  Identity was confirmed.  All relevant records and images related to the planned course of therapy were reviewed.  Then, the patient was positioned in a stable reproducible clinical set-up for radiation therapy.  Wires were placed to delineate the clinical extent of breast tissue. A wire was placed on the scar as well.  CT images were obtained.  An isocenter was placed. Skin markings were placed.  The CT images were loaded into the planning software where the target and avoidance structures were contoured.  The radiation prescription was entered and confirmed. The patient was discharged in stable condition and tolerated simulation well.    TREATMENT PLANNING NOTE:  Treatment planning then occurred. I have requested : MLC's, isodose plan, basic dose calculation  I personally designed and supervised the construction of 3 medically necessary complex treatment devices for the protection of critical normal structures including the lungs and contralateral breast as well as the immobilization device which is necessary for set up certainty.

## 2012-07-30 ENCOUNTER — Ambulatory Visit
Admission: RE | Admit: 2012-07-30 | Discharge: 2012-07-30 | Disposition: A | Discharge status: Home / Self Care | Payer: Medicare Other | Source: Ambulatory Visit | Attending: Radiation Oncology | Admitting: Radiation Oncology

## 2012-07-30 DIAGNOSIS — D0511 Intraductal carcinoma in situ of right breast: Secondary | ICD-10-CM

## 2012-07-30 NOTE — Progress Notes (Signed)
  Radiation Oncology         [phone] ________________________________  Name: Debra Henderson MRN: [id number]  Date: 07/30/2012  DOB: November 04, 1945  Simulation Verification Note  Status: outpatient  NARRATIVE: The patient was brought to the treatment unit and placed in the planned treatment position. The clinical setup was verified. Then port films were obtained and uploaded to the radiation oncology medical record software.  The treatment beams were carefully compared against the planned radiation fields. The position location and shape of the radiation fields was reviewed. The targeted volume of tissue appears appropriately covered by the radiation beams. Organs at risk appear to be excluded as planned.  Based on my personal review, I approved the simulation verification. The patient's treatment will proceed as planned.  ------------------------------------------------  Lurline Hare, MD

## 2012-08-02 ENCOUNTER — Ambulatory Visit
Admission: RE | Admit: 2012-08-02 | Discharge: 2012-08-02 | Disposition: A | Discharge status: Home / Self Care | Payer: Medicare Other | Source: Ambulatory Visit | Attending: Radiation Oncology | Admitting: Radiation Oncology

## 2012-08-02 VITALS — Wt 145.2 lb

## 2012-08-02 DIAGNOSIS — C50911 Malignant neoplasm of unspecified site of right female breast: Secondary | ICD-10-CM

## 2012-08-02 MED ORDER — ALRA NON-METALLIC DEODORANT (RAD-ONC)
1.0000 "application " | Freq: Once | TOPICAL | Status: AC
  Administered 2012-08-02: 1 via TOPICAL

## 2012-08-02 MED ORDER — RADIAPLEXRX EX GEL
Freq: Once | CUTANEOUS | Status: AC
  Administered 2012-08-02: 14:00:00 via TOPICAL

## 2012-08-02 NOTE — Progress Notes (Signed)
patient education done, alra ,radiaplex gel, radiation therapy and you book given, discusses fatigue, skin irritation, pain, instructions of use of alra deodorant and radiaplex gel, verbal understanding, teach back 2:15 PM

## 2012-08-03 ENCOUNTER — Encounter: Payer: Self-pay | Admitting: Radiation Oncology

## 2012-08-03 ENCOUNTER — Ambulatory Visit
Admission: RE | Admit: 2012-08-03 | Discharge: 2012-08-03 | Disposition: A | Discharge status: Home / Self Care | Payer: Medicare Other | Source: Ambulatory Visit | Attending: Radiation Oncology | Admitting: Radiation Oncology

## 2012-08-03 VITALS — BP 137/73 | HR 76 | Temp 98.3°F | Resp 20 | Wt 146.6 lb

## 2012-08-03 DIAGNOSIS — C50911 Malignant neoplasm of unspecified site of right female breast: Secondary | ICD-10-CM

## 2012-08-03 NOTE — Progress Notes (Signed)
Weekly Management Note Current Dose: 5.34 Gy  Projected Dose: 42.72 Gy   Narrative:  The patient presents for routine under treatment assessment.  CBCT/MVCT images/Port film x-rays were reviewed.  The chart was checked. Doing well. No complaints  Physical Findings: Weight: 146 lb 9.6 oz (66.497 kg). Unchanged  Impression:  The patient is tolerating radiation.  Plan:  Continue treatment as planned. RN education performed.

## 2012-08-03 NOTE — Progress Notes (Signed)
Weekly rad txs, 2nd tx rt breast, no pain,tenderness , no skin changes as yet, has radiaplex gel given yesterday, will apply todat and bedtime 1:57 PM

## 2012-08-04 ENCOUNTER — Ambulatory Visit
Admission: RE | Admit: 2012-08-04 | Discharge: 2012-08-04 | Disposition: A | Discharge status: Home / Self Care | Payer: Medicare Other | Source: Ambulatory Visit | Attending: Radiation Oncology | Admitting: Radiation Oncology

## 2012-08-05 ENCOUNTER — Ambulatory Visit
Admission: RE | Admit: 2012-08-05 | Discharge: 2012-08-05 | Disposition: A | Discharge status: Home / Self Care | Payer: Medicare Other | Source: Ambulatory Visit | Attending: Radiation Oncology | Admitting: Radiation Oncology

## 2012-08-06 ENCOUNTER — Ambulatory Visit
Admission: RE | Admit: 2012-08-06 | Discharge: 2012-08-06 | Disposition: A | Discharge status: Home / Self Care | Payer: Medicare Other | Source: Ambulatory Visit | Attending: Radiation Oncology | Admitting: Radiation Oncology

## 2012-08-09 ENCOUNTER — Ambulatory Visit
Admission: RE | Admit: 2012-08-09 | Discharge: 2012-08-09 | Disposition: A | Discharge status: Home / Self Care | Payer: Medicare Other | Source: Ambulatory Visit | Attending: Radiation Oncology | Admitting: Radiation Oncology

## 2012-08-09 ENCOUNTER — Ambulatory Visit (INDEPENDENT_AMBULATORY_CARE_PROVIDER_SITE_OTHER): Payer: Medicare Other | Admitting: General Surgery

## 2012-08-09 ENCOUNTER — Encounter (INDEPENDENT_AMBULATORY_CARE_PROVIDER_SITE_OTHER): Payer: Self-pay | Admitting: General Surgery

## 2012-08-09 VITALS — BP 112/60 | HR 96 | Temp 99.0°F | Resp 16 | Ht 60.0 in | Wt 147.0 lb

## 2012-08-09 DIAGNOSIS — D0590 Unspecified type of carcinoma in situ of unspecified breast: Secondary | ICD-10-CM

## 2012-08-09 DIAGNOSIS — D0511 Intraductal carcinoma in situ of right breast: Secondary | ICD-10-CM

## 2012-08-09 NOTE — Patient Instructions (Signed)
Finish radiation Follow up with Dr. Welton Flakes

## 2012-08-10 ENCOUNTER — Ambulatory Visit
Admission: RE | Admit: 2012-08-10 | Discharge: 2012-08-10 | Disposition: A | Discharge status: Home / Self Care | Payer: Medicare Other | Source: Ambulatory Visit | Attending: Radiation Oncology | Admitting: Radiation Oncology

## 2012-08-10 ENCOUNTER — Encounter: Payer: Self-pay | Admitting: Radiation Oncology

## 2012-08-10 VITALS — BP 123/59 | HR 81 | Temp 98.0°F | Resp 18 | Wt 144.8 lb

## 2012-08-10 DIAGNOSIS — C50911 Malignant neoplasm of unspecified site of right female breast: Secondary | ICD-10-CM

## 2012-08-10 NOTE — Progress Notes (Signed)
Weekly Management Note Current Dose:  18.69 Gy  Projected Dose: 42.72 Gy   Narrative:  The patient presents for routine under treatment assessment.  CBCT/MVCT images/Port film x-rays were reviewed.  The chart was checked. Doing well. No complaints.  Physical Findings: Weight: 144 lb 12.8 oz (65.681 kg). Unchanged  Impression:  The patient is tolerating radiation.  Plan:  Continue treatment as planned. Continue radiaplex.

## 2012-08-10 NOTE — Progress Notes (Signed)
Patient has no complaints at this time. Denies skin changes to right/treated breast. Reports using radiaplex bid as directed. Reports mild fatigue.

## 2012-08-11 ENCOUNTER — Ambulatory Visit
Admission: RE | Admit: 2012-08-11 | Discharge: 2012-08-11 | Disposition: A | Discharge status: Home / Self Care | Payer: Medicare Other | Source: Ambulatory Visit | Attending: Radiation Oncology | Admitting: Radiation Oncology

## 2012-08-12 ENCOUNTER — Ambulatory Visit
Admission: RE | Admit: 2012-08-12 | Discharge: 2012-08-12 | Disposition: A | Discharge status: Home / Self Care | Payer: Medicare Other | Source: Ambulatory Visit | Attending: Radiation Oncology | Admitting: Radiation Oncology

## 2012-08-13 ENCOUNTER — Ambulatory Visit
Admission: RE | Admit: 2012-08-13 | Discharge: 2012-08-13 | Disposition: A | Discharge status: Home / Self Care | Payer: Medicare Other | Source: Ambulatory Visit | Attending: Radiation Oncology | Admitting: Radiation Oncology

## 2012-08-13 ENCOUNTER — Ambulatory Visit (HOSPITAL_BASED_OUTPATIENT_CLINIC_OR_DEPARTMENT_OTHER): Payer: Medicare Other | Admitting: Oncology

## 2012-08-13 ENCOUNTER — Telehealth: Payer: Self-pay | Admitting: *Deleted

## 2012-08-13 VITALS — BP 114/67 | HR 90 | Temp 97.9°F | Resp 20 | Ht 60.0 in | Wt 146.6 lb

## 2012-08-13 DIAGNOSIS — D0590 Unspecified type of carcinoma in situ of unspecified breast: Secondary | ICD-10-CM

## 2012-08-13 DIAGNOSIS — D0511 Intraductal carcinoma in situ of right breast: Secondary | ICD-10-CM

## 2012-08-13 NOTE — Telephone Encounter (Signed)
appts made and printed...td 

## 2012-08-16 ENCOUNTER — Ambulatory Visit
Admission: RE | Admit: 2012-08-16 | Discharge: 2012-08-16 | Disposition: A | Discharge status: Home / Self Care | Payer: Medicare Other | Source: Ambulatory Visit | Attending: Radiation Oncology | Admitting: Radiation Oncology

## 2012-08-17 ENCOUNTER — Ambulatory Visit
Admission: RE | Admit: 2012-08-17 | Discharge: 2012-08-17 | Disposition: A | Discharge status: Home / Self Care | Payer: Medicare Other | Source: Ambulatory Visit | Attending: Radiation Oncology | Admitting: Radiation Oncology

## 2012-08-17 ENCOUNTER — Encounter: Payer: Self-pay | Admitting: Radiation Oncology

## 2012-08-17 VITALS — BP 153/71 | HR 74 | Temp 98.2°F | Resp 20 | Wt 146.4 lb

## 2012-08-17 DIAGNOSIS — C50911 Malignant neoplasm of unspecified site of right female breast: Secondary | ICD-10-CM

## 2012-08-17 NOTE — Progress Notes (Signed)
Weekly rad txs, Rt breast 12 sompleted so far, erythema, and rash like starting on front and under inframmary fold, using radiaplex bid, just startuing to itch", skin intact, no c/o pain 1:25 PM

## 2012-08-17 NOTE — Progress Notes (Signed)
[facility]    Radiation Oncology [address] [place]     Maryln Gottron, M.D. [place], [place] [address]               Billie Lade, M.D., Ph.D. Phone: [phone]      Molli Hazard A. Kathrynn Running, M.D. Fax: [phone]      Radene Gunning, M.D., Ph.D.         Lurline Hare, M.D.         Grayland Jack, M.D Weekly Treatment Management Note  Name: Debra Henderson     MRN: [id number]        CSN: [id number] Date: 08/17/2012      DOB: 09/22/1945  CC: Debra Henderson., MD         [place]    Status: Outpatient  Diagnosis: The encounter diagnosis was Breast cancer, right.  Current Dose: 32.04 Gy  Current Fraction: 12  Planned Dose: 42.72 Gy  Narrative: Debra Henderson was seen today for weekly treatment management. The chart was checked and port films  were reviewed. She has noticed some itching in the breast area as well fatigue.  Erythromycin; Tetracyclines & related; Vytorin; Chicken allergy; Eggs or egg-derived products; Lipitor; Penicillins; and Sulfa antibiotics Current Outpatient Prescriptions  Medication Sig Dispense Refill  . alum & mag hydroxide-simeth (MAALOX/MYLANTA) 200-200-20 MG/5ML suspension Take by mouth every 6 (six) hours as needed for indigestion.      Marland Kitchen aspirin 81 MG tablet Take 81 mg by mouth daily.      . clopidogrel (PLAVIX) 75 MG tablet Take 75 mg by mouth daily.      . fluocinonide cream (LIDEX) 0.05 % Apply 1 application topically daily as needed (for itching and skin irritations; uses it only once every two weeks as needed.).      Marland Kitchen hyaluronate sodium (RADIAPLEXRX) GEL Apply 1 application topically 2 (two) times daily. Apply after rad tx and bedtime      . losartan-hydrochlorothiazide (HYZAAR) 50-12.5 MG per tablet Take 1 tablet by mouth daily.  30 tablet  6  . non-metallic deodorant (ALRA) MISC Apply 1 application topically daily.      Marland Kitchen omega-3 acid ethyl esters (LOVAZA) 1 G capsule Take 1 g by mouth 2 (two) times daily.      Marland Kitchen oxybutynin  (DITROPAN-XL) 5 MG 24 hr tablet Take 5 mg by mouth 2 (two) times daily.      . rosuvastatin (CRESTOR) 10 MG tablet Take 10 mg by mouth every evening.        No current facility-administered medications for this encounter.      Physical Examination:  weight is 146 lb 6.4 oz (66.407 kg). Her oral temperature is 98.2 F (36.8 C). Her blood pressure is 153/71 and her pulse is 74. Her respiration is 20.    Wt Readings from Last 3 Encounters:  08/17/12 146 lb 6.4 oz (66.407 kg)  08/13/12 146 lb 9.6 oz (66.497 kg)  08/10/12 144 lb 12.8 oz (65.681 kg)    The right breast area shows erythema particularly in the upper inner quadrant. There is no skin breakdown appreciated. Lungs - Normal respiratory effort, chest expands symmetrically. Lungs are clear to auscultation, no crackles or wheezes.  Heart has regular rhythm and rate  Abdomen is soft and non tender with normal bowel sounds  Assessment:  Patient tolerating treatments well  Plan: Continue treatment per original radiation prescription

## 2012-08-18 ENCOUNTER — Ambulatory Visit
Admission: RE | Admit: 2012-08-18 | Discharge: 2012-08-18 | Disposition: A | Discharge status: Home / Self Care | Payer: Medicare Other | Source: Ambulatory Visit | Attending: Radiation Oncology | Admitting: Radiation Oncology

## 2012-08-19 ENCOUNTER — Ambulatory Visit
Admission: RE | Admit: 2012-08-19 | Discharge: 2012-08-19 | Disposition: A | Discharge status: Home / Self Care | Payer: Medicare Other | Source: Ambulatory Visit | Attending: Radiation Oncology | Admitting: Radiation Oncology

## 2012-08-20 ENCOUNTER — Ambulatory Visit
Admission: RE | Admit: 2012-08-20 | Discharge: 2012-08-20 | Disposition: A | Discharge status: Home / Self Care | Payer: Medicare Other | Source: Ambulatory Visit | Attending: Radiation Oncology | Admitting: Radiation Oncology

## 2012-08-23 ENCOUNTER — Ambulatory Visit
Admission: RE | Admit: 2012-08-23 | Discharge: 2012-08-23 | Disposition: A | Discharge status: Home / Self Care | Payer: Medicare Other | Source: Ambulatory Visit | Attending: Radiation Oncology | Admitting: Radiation Oncology

## 2012-08-23 ENCOUNTER — Encounter: Payer: Self-pay | Admitting: Radiation Oncology

## 2012-08-23 VITALS — BP 111/69 | HR 71 | Temp 97.7°F | Resp 20 | Wt 146.9 lb

## 2012-08-23 DIAGNOSIS — D0511 Intraductal carcinoma in situ of right breast: Secondary | ICD-10-CM

## 2012-08-23 NOTE — Progress Notes (Signed)
Weekly put right breast, erythema and start of dermatitis, under inframmary fold, skin thinning, skin intact,uses radiaplex bid, was put on cipro for UTI this past Saturday, gave FYYN pamphlett, continue lotion 2 weeks som fatigue 1:56 PM

## 2012-08-23 NOTE — Progress Notes (Signed)
Weekly Management Note:  Site: Right breast Current Dose:  4272  cGy Projected Dose: 4272  cGy  Narrative: The patient is seen today for routine under treatment assessment. CBCT/MVCT images/port films were reviewed. The chart was reviewed.   She is without complaints today. She uses Radioplex gel twice a day. She was placed on Cipro for UTI this past Saturday.  Physical Examination:  Filed Vitals:   08/23/12 1352  BP: 111/69  Pulse: 71  Temp: 97.7 F (36.5 C)  Resp: 20  .  Weight: 146 lb 14.4 oz (66.633 kg). There is mild hyperpigmentation and erythema along the right breast which is more intense along the inframammary region where there is patchy dry desquamation. No areas of moist desquamation appear  Impression: Radiation therapy well tolerated and completed.  Plan: Followup with Dr. Michell Heinrich in one month. She was given a [employer] .

## 2012-08-24 ENCOUNTER — Ambulatory Visit: Payer: Medicare Other

## 2012-08-25 ENCOUNTER — Ambulatory Visit: Payer: Medicare Other

## 2012-08-25 NOTE — Progress Notes (Signed)
  Radiation Oncology         [phone] ________________________________  Name: Debra Henderson MRN: [id number]  Date: 08/23/2012  DOB: Apr 22, 1945  End of Treatment Note  Diagnosis:   DCIS of the right breast     Indication for treatment:  Curative       Radiation treatment dates:   08/02/2012-08/23/2012  Site/dose:   Right breast 42.72@2 .67 Gy per fraction x 16 fractions  Beams/energy:   Opposed tangents with reduced fields / 6 MV photons  Narrative: The patient tolerated radiation treatment relatively well.   She had patchy dry desquamation but no moist desquamation which was expected.  Plan: The patient has completed radiation treatment. The patient will return to radiation oncology clinic for routine followup in one month. I advised them to call or return sooner if they have any questions or concerns related to their recovery or treatment.  ------------------------------------------------  Lurline Hare, MD

## 2012-08-26 ENCOUNTER — Ambulatory Visit: Payer: Medicare Other

## 2012-08-27 ENCOUNTER — Ambulatory Visit: Payer: Medicare Other

## 2012-08-29 NOTE — Progress Notes (Signed)
OFFICE PROGRESS NOTE  CC  Cassell Smiles., MD [address] [place] [address] Dr. Chevis Pretty  Dr. Lurline Hare  DIAGNOSIS: 67 year old female with new diagnosis of right DCIS status post lumpectomy  STAGE:  Right Breast  DCIS   PRIOR THERAPY: #1Patient underwent a screening mammogram and was noted to have calcifications in the upper outer quadrant of the right breast. Biopsy showed ductal carcinoma in situ. Patient was seen by Dr. Carolynne Edouard and underwent a lumpectomy on 04/22/2012. The final pathology showed a low grade ductal carcinoma in situ with a 0.1 cm focus of disease. This was focally close to the posterior margin at 0.1 cm. Also noted was atypical lobular hyperplasia. The tumor was ER +100% PR +100%.  #2 patient is receiving adjuvant radiation therapy she will complete this on 08/23/2012. Thus far she is tolerating it well.  #3 patient and I discussed adjuvant antiestrogen therapy to help prevent future breast cancer risk in the ipsilateral and contralateral breast. We certainly would use tamoxifen or an aromatase inhibitor. However we will begin this after she completes radiation. She does want some time off to recover from the radiation as well.  CURRENT THERAPY:planning for adjuvant antiestrogen therapy  INTERVAL HISTORY: Debra Henderson 67 y.o. female returns for followup visit today. She is still in the midst of radiation. She seems to be tolerating quite well. She does have some skin changes but they're not as bad as she felt initially that she would get. She has no nausea vomiting no fevers chills no headaches double vision blurring of vision. Remainder of the 10 point review of systems is contributory.  MEDICAL HISTORY: Past Medical History  Diagnosis Date  . Peripheral vascular disease     prior stenting of left iliac artery  . Hypertension   . Chronic kidney disease     renal artery stenosis  . Cancer 2014    breast cancer  . Family  history of anesthesia complication     my brother is difficult to put to sleep also  . Complication of anesthesia     ' It does not work " I am difficult to put tpo sleep  . GERD (gastroesophageal reflux disease)     otc  . Breast cancer 02/13/12    right upper outer- DCIS, ER/PR+  . Hyperlipidemia   . Claudication     lower extremities  . History of renal stent     LEA DUPLEX, 03/16/2012 - LEFT EIA DISTAL/COMMON FEMORAL ARTERY-demonstrated occlusive disease  . Renal artery stenosis     RENAL DOPPLER, 02/12/2011 - RIGHT RENAL ARTERY 60-99% diameter reduction, LEFT RENAL ARTERY AT STENT 60-99% diameter reduction    ALLERGIES:  is allergic to erythromycin; tetracyclines & related; vytorin; chicken allergy; eggs or egg-derived products; lipitor; penicillins; and sulfa antibiotics.  MEDICATIONS:  Current Outpatient Prescriptions  Medication Sig Dispense Refill  . aspirin 81 MG tablet Take 81 mg by mouth daily.      . clopidogrel (PLAVIX) 75 MG tablet Take 75 mg by mouth daily.      . fluocinonide cream (LIDEX) 0.05 % Apply 1 application topically daily as needed (for itching and skin irritations; uses it only once every two weeks as needed.).      Marland Kitchen hyaluronate sodium (RADIAPLEXRX) GEL Apply 1 application topically 2 (two) times daily. Apply after rad tx and bedtime      . losartan-hydrochlorothiazide (HYZAAR) 50-12.5 MG per tablet Take 1 tablet by mouth daily.  30  tablet  6  . non-metallic deodorant (ALRA) MISC Apply 1 application topically daily.      Marland Kitchen omega-3 acid ethyl esters (LOVAZA) 1 G capsule Take 1 g by mouth 2 (two) times daily.      Marland Kitchen oxybutynin (DITROPAN-XL) 5 MG 24 hr tablet Take 5 mg by mouth 2 (two) times daily.      . rosuvastatin (CRESTOR) 10 MG tablet Take 10 mg by mouth every evening.       Marland Kitchen alum & mag hydroxide-simeth (MAALOX/MYLANTA) 200-200-20 MG/5ML suspension Take by mouth every 6 (six) hours as needed for indigestion.      . ciprofloxacin (CIPRO) 500 MG tablet Take  500 mg by mouth 2 (two) times daily.       No current facility-administered medications for this visit.    SURGICAL HISTORY:  Past Surgical History  Procedure Laterality Date  . Tubal ligation      1980's  . Fibroid breast      left breast-adenoma benign 1980's  . Iliac artery stent  11/21/19/02    left   . Stent /kidney  12/10/00,08/04/06    2002 R renal artery, 2008 L renal  . Bladder suspension  1980's  . Angioplasty illiac artery  02/24/2012    Dr Allyson Sabal  L iliac restent  . Breast lumpectomy with needle localization Right 04/22/2012    Procedure: RIGHT BREAST WIRE LOCALIZATION  LUMPECTOMY ;  Surgeon: Robyne Askew, MD;  Location: [facility] OR;  Service: General;  Laterality: Right;  . Peripheral vascular catheterization Left 02/24/2012    Common iliac artery, 8x38 iCast Stent, resulting in a reduction from 80% in-stent restenosis to 0% residual  . Peripheral vascular catheterization Left 08/04/2006    Renal 90% stenosis, 3.5x 13 drug-eluting stent resulting in a reduction of 90% in-stent restenosis to 0% residual  . Peripheral vascular catheterization Left 05/22/2005    Renal 80% in-stent stenosis, 5x15 Aviator resulting in a reduction of 80% in-stent restenosis to 0% residual  . Peripheral vascular catheterization Left 02/22/2002    Renal in-stent restenosis, 5x15 Guidant rapid-exchange balloon resulting in reduction of a 95% in-stent restenosis to less than 20% residual  . Peripheral vascular catheterization Left 12/10/2000    95% renal stenosis, 6x64mm Genesis Aviator balloon/stent deployed at 10 atmospheres resulting in a reduction  of 95% stenosis to 0% residual; Common iliac artery stenosis, P12x4 mounted on a 7x2 Powerflex balloon resulting in a reduction of 80% to 0% residual  . Peripheral vascular catheterization Left 07/27/2000    95% renal stenosis, 7mm x 6cm Smart stent resulting in a reduction of 90-95%  to 0% residual  . Nm myoview ltd  03/21/2010    normal myocardial perfusion study     REVIEW OF SYSTEMS:  Pertinent items are noted in HPI.   HEALTH MAINTENANCE:    PHYSICAL EXAMINATION: Blood pressure 114/67, pulse 90, temperature 97.9 F (36.6 C), temperature source Oral, resp. rate 20, height 5' (1.524 m), weight 146 lb 9.6 oz (66.497 kg). Body mass index is 28.63 kg/(m^2). ECOG PERFORMANCE STATUS: 0 - Asymptomatic   General appearance: alert, cooperative and appears stated age Resp: clear to auscultation bilaterally Cardio: regular rate and rhythm GI: soft, non-tender; bowel sounds normal; no masses,  no organomegaly Extremities: extremities normal, atraumatic, no cyanosis or edema Neurologic: Grossly normal   LABORATORY DATA: Lab Results  Component Value Date   WBC 10.8* 06/01/2012   HGB 12.5 06/01/2012   HCT 35.7* 06/01/2012   MCV 92.7 06/01/2012  PLT 304 06/01/2012      Chemistry      Component Value Date/Time   NA 133* 06/01/2012 1120   NA 137 05/20/2012 1454   K 3.4* 06/01/2012 1120   K 3.9 05/20/2012 1454   CL 96 06/01/2012 1120   CL 100 05/20/2012 1454   CO2 24 06/01/2012 1120   CO2 29 05/20/2012 1454   BUN 11 06/01/2012 1120   BUN 7.1 05/20/2012 1454   CREATININE 0.61 06/01/2012 1120   CREATININE 0.8 05/20/2012 1454      Component Value Date/Time   CALCIUM 9.3 06/01/2012 1120   CALCIUM 9.4 05/20/2012 1454   ALKPHOS 85 05/20/2012 1454   AST 10 05/20/2012 1454   ALT 10 05/20/2012 1454   BILITOT 0.30 05/20/2012 1454       RADIOGRAPHIC STUDIES:  No results found.  ASSESSMENT: 67 year old female with DCIS status post lumpectomy now receiving adjuvant radiation therapy thus far tolerating it well. She eventually will go on antiestrogen therapy with either tamoxifen or an aromatase inhibitor such as Aromasin. We discussed this in detail today. However before we start this she needs to finish up radiation.   PLAN:   #1 complete radiation therapy on 08/23/2012.  #2 I will see you back in September 2014 to begin adjuvant antiestrogen therapy    All  questions were answered. The patient knows to call the clinic with any problems, questions or concerns. We can certainly see the patient much sooner if necessary.  I spent 15 minutes counseling the patient face to face. The total time spent in the appointment was 20 minutes.    Drue Second, MD Medical/Oncology [facility] [phone] (beeper) [phone] (Office)

## 2012-08-30 ENCOUNTER — Ambulatory Visit: Payer: Medicare Other

## 2012-08-31 ENCOUNTER — Ambulatory Visit: Payer: Medicare Other

## 2012-08-31 NOTE — Progress Notes (Signed)
Subjective:     Patient ID: Debra Henderson, female   DOB: 14-Jan-1946, 67 y.o.   MRN: [id number]  HPI The patient is a 67 year old female who is 3 months status post right lumpectomy for DCIS. She was ER and PR positive. She is receiving radiation therapy now seems to be tolerating that well. She denies any significant breast pain. She denies any discharge or nipple.  Review of Systems  Constitutional: Negative.   HENT: Negative.   Eyes: Negative.   Respiratory: Negative.   Cardiovascular: Negative.   Gastrointestinal: Negative.   Endocrine: Negative.   Genitourinary: Negative.   Musculoskeletal: Negative.   Skin: Negative.   Allergic/Immunologic: Negative.   Neurological: Negative.   Hematological: Negative.   Psychiatric/Behavioral: Negative.        Objective:   Physical Exam  Constitutional: She is oriented to person, place, and time. She appears well-developed and well-nourished.  HENT:  Head: Normocephalic and atraumatic.  Eyes: Conjunctivae and EOM are normal. Pupils are equal, round, and reactive to light.  Neck: Normal range of motion. Neck supple.  Cardiovascular: Normal rate, regular rhythm and normal heart sounds.   Pulmonary/Chest: Effort normal and breath sounds normal.  Her right breast incision is healing nicely. There is no sign of infection or significant seroma. Her skin is tolerating radiation therapy well.  Abdominal: Soft. Bowel sounds are normal. She exhibits no mass. There is no tenderness.  Musculoskeletal: Normal range of motion.  Lymphadenopathy:    She has no cervical adenopathy.  Neurological: She is alert and oriented to person, place, and time.  Skin: Skin is warm and dry.  Psychiatric: She has a normal mood and affect. Her behavior is normal.       Assessment:     The patient is 3 months status post right lumpectomy for DCIS     Plan:     At this point she will finish radiation therapy and then start antiestrogen therapy. She will  continue to do regular self exams. We will plan to see her back in about 6 months.

## 2012-09-01 ENCOUNTER — Ambulatory Visit: Payer: Medicare Other

## 2012-09-02 ENCOUNTER — Ambulatory Visit: Payer: Medicare Other

## 2012-09-03 ENCOUNTER — Ambulatory Visit: Payer: Medicare Other

## 2012-09-06 ENCOUNTER — Ambulatory Visit: Payer: Medicare Other

## 2012-09-07 ENCOUNTER — Ambulatory Visit: Payer: Medicare Other

## 2012-09-08 ENCOUNTER — Ambulatory Visit: Payer: Medicare Other

## 2012-09-09 ENCOUNTER — Ambulatory Visit: Payer: Medicare Other

## 2012-09-10 ENCOUNTER — Ambulatory Visit: Payer: Medicare Other

## 2012-09-13 ENCOUNTER — Ambulatory Visit: Payer: Medicare Other

## 2012-09-14 ENCOUNTER — Ambulatory Visit: Payer: Medicare Other

## 2012-09-15 ENCOUNTER — Ambulatory Visit: Payer: Medicare Other

## 2012-10-05 ENCOUNTER — Ambulatory Visit: Payer: Medicare Other | Admitting: Radiation Oncology

## 2012-10-08 ENCOUNTER — Encounter: Payer: Self-pay | Admitting: Radiation Oncology

## 2012-10-08 ENCOUNTER — Ambulatory Visit
Admission: RE | Admit: 2012-10-08 | Discharge: 2012-10-08 | Disposition: A | Discharge status: Home / Self Care | Payer: Medicare Other | Source: Ambulatory Visit | Attending: Radiation Oncology | Admitting: Radiation Oncology

## 2012-10-08 VITALS — BP 143/65 | HR 93 | Temp 97.6°F | Ht 60.0 in | Wt 146.3 lb

## 2012-10-08 DIAGNOSIS — D0511 Intraductal carcinoma in situ of right breast: Secondary | ICD-10-CM

## 2012-10-08 HISTORY — DX: Reserved for inherently not codable concepts without codable children: IMO0001

## 2012-10-08 HISTORY — DX: Reserved for concepts with insufficient information to code with codable children: IMO0002

## 2012-10-08 NOTE — Progress Notes (Signed)
   Department of Radiation Oncology  Phone:  [phone] Fax:        [phone]   Name: Debra Henderson MRN: [id number]  DOB: 1945-10-17  Date: 10/08/2012  Follow Up Visit Note  Diagnosis: DCIS of the right breast  Summary and Interval since last radiation: 42.72 Gy completed 08/23/12  Interval History: Debra Henderson presents today for routine followup.  She has done well. She is still using Radiaplex. She has some tanning but that's about it. She waited for about 3 hours to see Dr. Welton Flakes and then left so she has not started on antiestrogen therapy. She is due for mammograms in March.   Allergies:  Allergies  Allergen Reactions  . Erythromycin   . Tetracyclines & Related Nausea And Vomiting and Other (See Comments)    Vomiting blood  . Vytorin [Ezetimibe-Simvastatin]     Per Dr Erlene Quan note  . Chicken Allergy Diarrhea and Rash  . Eggs Or Egg-Derived Products Diarrhea and Rash  . Lipitor [Atorvastatin] Rash and Other (See Comments)    Myalgias   . Penicillins Swelling and Rash  . Sulfa Antibiotics Swelling and Rash    Medications:  Current Outpatient Prescriptions  Medication Sig Dispense Refill  . alum & mag hydroxide-simeth (MAALOX/MYLANTA) 200-200-20 MG/5ML suspension Take by mouth every 6 (six) hours as needed for indigestion.      Marland Kitchen aspirin 81 MG tablet Take 81 mg by mouth daily.      . clopidogrel (PLAVIX) 75 MG tablet Take 75 mg by mouth daily.      . hyaluronate sodium (RADIAPLEXRX) GEL Apply 1 application topically 2 (two) times daily. Apply after rad tx and bedtime      . losartan-hydrochlorothiazide (HYZAAR) 50-12.5 MG per tablet Take 1 tablet by mouth daily.  30 tablet  6  . omega-3 acid ethyl esters (LOVAZA) 1 G capsule Take 1 g by mouth 2 (two) times daily.      Marland Kitchen oxybutynin (DITROPAN-XL) 5 MG 24 hr tablet Take 5 mg by mouth 2 (two) times daily.      . rosuvastatin (CRESTOR) 10 MG tablet Take 10 mg by mouth every evening.       . fluocinonide cream (LIDEX) 0.05  % Apply 1 application topically daily as needed (for itching and skin irritations; uses it only once every two weeks as needed.).      Marland Kitchen non-metallic deodorant (ALRA) MISC Apply 1 application topically daily.       No current facility-administered medications for this encounter.    Physical Exam:  Filed Vitals:   10/08/12 1422  BP: 143/65  Pulse: 93  Temp: 97.6 F (36.4 C)   Tanning of . Her scar is well-healed.  IMPRESSION: Debra Henderson is a 67 y.o. female status post breast conservation for DCIS of the right breast with resolving acute effects of treatment  PLAN:  She looks great. Her skin is healing well. We discussed picking up lotion with vitamin E. I offered to follow her in 6 months. It she would like to continue her followup with Dr. Welton Flakes and Dr. Carolynne Edouard. I encouraged her to contact me if she decided she elected not to take antiestrogen therapy and I would be happy to see her back. We discussed sun protection in the treated area.    Lurline Hare, MD

## 2012-10-08 NOTE — Progress Notes (Signed)
Debra Henderson here for follow up after treatment to her right breast.  She denies pain.  She reports that the fatigue she was having during treatment is much better.  The skin on her breast is intact with hyperpigmentation.  She is using radiaplex gel.

## 2012-10-14 ENCOUNTER — Ambulatory Visit (HOSPITAL_BASED_OUTPATIENT_CLINIC_OR_DEPARTMENT_OTHER): Payer: Medicare Other | Admitting: Oncology

## 2013-01-05 ENCOUNTER — Encounter (INDEPENDENT_AMBULATORY_CARE_PROVIDER_SITE_OTHER): Payer: Self-pay | Admitting: General Surgery

## 2013-01-15 ENCOUNTER — Other Ambulatory Visit: Payer: Self-pay | Admitting: Cardiovascular Disease

## 2013-01-17 NOTE — Telephone Encounter (Signed)
Rx was sent to pharmacy electronically. 

## 2013-01-18 ENCOUNTER — Telehealth: Payer: Self-pay

## 2013-01-18 NOTE — Telephone Encounter (Signed)
Pt was referred by Dr. Sherwood Gambler for screening colonscopy. LM for pt to call.

## 2013-02-25 ENCOUNTER — Other Ambulatory Visit: Payer: Self-pay | Admitting: Internal Medicine

## 2013-02-25 DIAGNOSIS — Z9889 Other specified postprocedural states: Secondary | ICD-10-CM

## 2013-02-25 DIAGNOSIS — Z1231 Encounter for screening mammogram for malignant neoplasm of breast: Secondary | ICD-10-CM

## 2013-03-11 ENCOUNTER — Ambulatory Visit: Payer: Medicare Other

## 2013-03-23 NOTE — Telephone Encounter (Signed)
Letter to PCP

## 2013-03-25 ENCOUNTER — Ambulatory Visit: Payer: Medicare Other

## 2013-04-04 ENCOUNTER — Encounter (INDEPENDENT_AMBULATORY_CARE_PROVIDER_SITE_OTHER): Payer: Self-pay

## 2013-04-04 ENCOUNTER — Ambulatory Visit (INDEPENDENT_AMBULATORY_CARE_PROVIDER_SITE_OTHER): Payer: Medicare Other | Admitting: General Surgery

## 2013-04-04 ENCOUNTER — Encounter (INDEPENDENT_AMBULATORY_CARE_PROVIDER_SITE_OTHER): Payer: Self-pay | Admitting: General Surgery

## 2013-04-04 VITALS — BP 136/80 | HR 73 | Temp 98.0°F | Resp 12 | Ht 60.0 in | Wt 144.0 lb

## 2013-04-04 DIAGNOSIS — D0510 Intraductal carcinoma in situ of unspecified breast: Secondary | ICD-10-CM

## 2013-04-04 DIAGNOSIS — D0590 Unspecified type of carcinoma in situ of unspecified breast: Secondary | ICD-10-CM

## 2013-04-04 NOTE — Patient Instructions (Signed)
Continue regular self exams Will make an appt with new oncologist to start antiestrogens

## 2013-04-21 ENCOUNTER — Other Ambulatory Visit: Payer: Self-pay | Admitting: Oncology

## 2013-04-25 ENCOUNTER — Telehealth: Payer: Self-pay | Admitting: *Deleted

## 2013-04-25 NOTE — Telephone Encounter (Signed)
Per Dr. Jana Hakim, scheduled pt to see Micah Flesher, PA on 06/05/13 at 1:45.  Confirmed new appt date and time with pt.  Gave contact information.  Pt denies further needs at this time.

## 2013-04-26 ENCOUNTER — Other Ambulatory Visit: Payer: Self-pay | Admitting: *Deleted

## 2013-04-26 NOTE — Progress Notes (Signed)
Subjective:     Patient ID: Debra Henderson, female   DOB: [birth year], 68 y.o.   MRN: [id number]  HPI The patient is a 68 year old white female who is one year status post right lumpectomy for DCIS. She was ER and PR positive. She finished radiation therapy. She never did get to have a good conversation with the medical oncologist to talk about antiestrogen therapy. Because of this she never started her antiestrogen therapy. She has been well since her last visit and has no complaints today.  Review of Systems  Constitutional: Negative.   HENT: Negative.   Eyes: Negative.   Respiratory: Negative.   Cardiovascular: Negative.   Gastrointestinal: Negative.   Endocrine: Negative.   Genitourinary: Negative.   Musculoskeletal: Negative.   Skin: Negative.   Allergic/Immunologic: Negative.   Neurological: Negative.   Hematological: Negative.   Psychiatric/Behavioral: Negative.        Objective:   Physical Exam  Constitutional: She is oriented to person, place, and time. She appears well-developed and well-nourished.  HENT:  Head: Normocephalic and atraumatic.  Eyes: Conjunctivae and EOM are normal. Pupils are equal, round, and reactive to light.  Neck: Normal range of motion. Neck supple.  Cardiovascular: Normal rate, regular rhythm and normal heart sounds.   Pulmonary/Chest: Effort normal and breath sounds normal.  Her right breast incision is healing nicely. There is no palpable mass in either breast. There is no palpable axillary, supraclavicular, or cervical lymphadenopathy  Abdominal: Soft. Bowel sounds are normal.  Musculoskeletal: Normal range of motion.  Lymphadenopathy:    She has no cervical adenopathy.  Neurological: She is alert and oriented to person, place, and time.  Skin: Skin is warm and dry.  Psychiatric: She has a normal mood and affect. Her behavior is normal.       Assessment:     The patient is one year status post right lumpectomy for DCIS     Plan:    At this point I will plan to make her a new appointment with the different medical oncologist to talk about antiestrogen therapy. She will continue to do regular self exams. I will plan to see her back in about 6 months.

## 2013-05-20 HISTORY — PX: BREAST BIOPSY: SHX20

## 2013-06-06 ENCOUNTER — Ambulatory Visit (HOSPITAL_BASED_OUTPATIENT_CLINIC_OR_DEPARTMENT_OTHER): Payer: Medicare Other | Admitting: Physician Assistant

## 2013-06-06 ENCOUNTER — Telehealth: Payer: Self-pay | Admitting: Oncology

## 2013-06-06 ENCOUNTER — Other Ambulatory Visit: Payer: Self-pay | Admitting: Oncology

## 2013-06-06 ENCOUNTER — Encounter (INDEPENDENT_AMBULATORY_CARE_PROVIDER_SITE_OTHER): Payer: Self-pay

## 2013-06-06 ENCOUNTER — Encounter: Payer: Self-pay | Admitting: Physician Assistant

## 2013-06-06 VITALS — BP 115/68 | HR 89 | Temp 97.8°F | Resp 18 | Ht 60.0 in | Wt 144.2 lb

## 2013-06-06 DIAGNOSIS — Z954 Presence of other heart-valve replacement: Secondary | ICD-10-CM

## 2013-06-06 DIAGNOSIS — D0510 Intraductal carcinoma in situ of unspecified breast: Secondary | ICD-10-CM

## 2013-06-06 DIAGNOSIS — Z853 Personal history of malignant neoplasm of breast: Secondary | ICD-10-CM

## 2013-06-06 DIAGNOSIS — D0590 Unspecified type of carcinoma in situ of unspecified breast: Secondary | ICD-10-CM

## 2013-06-06 DIAGNOSIS — I739 Peripheral vascular disease, unspecified: Secondary | ICD-10-CM

## 2013-06-06 DIAGNOSIS — Z170 Estrogen receptor positive status [ER+]: Secondary | ICD-10-CM

## 2013-06-07 NOTE — Progress Notes (Addendum)
Debra Henderson  Telephone:(336) [phone] Fax:(336) [phone]     ID: Debra Henderson OB: 11-27-1945  MR#: [id number]  NID#:[id number]  PCP: Glo Herring., MD GYN:   SU:  Autumn Messing, MD OTHER MD:  Thea Silversmith, MD; Quay Burow, MD  CHIEF COMPLAINT:  Hx of Right Breast Cancer  BREAST CANCER HISTORY: Debra Henderson underwent screening mammography in December 2013. Calcifications were noted in the right breast, requiring further evaluation.  Subsequently, a stereotactic right breast biopsy was obtained on 02/13/2012 which confirmed ductal carcinoma in situ with calcifications, low grade, ER and PR positive, both at 100%. [phone])  The patient underwent a right lumpectomy under the care of Dr. Marlou Starks on 04/22/2012. (Surgery have been delayed only slightly secondary to the patient's cardiac history and the requirements of restaging of the left common iliac artery prior to surgery.) Final pathology showed a low-grade ductal carcinoma in situ with a 0.1 cm focus of disease, and close to the posterior margin at 0.1 cm.  Atypical lobular hyperplasia was also noted. PTis, pNX. [id number]).  The patient was treated with adjuvant radiation therapy under the care of Dr. Pablo Ledger, completed 08/23/2012.  Her subsequent history is as detailed below.     INTERVAL HISTORY: Debra Henderson returns today for evaluation regarding her history of right breast cancer. She has undergone right lumpectomy, followed by adjuvant radiation which was completed in August 2014. She is here today, having recently met with her surgeon, to discuss antiestrogen therapy.  Interval history is generally unremarkable otherwise. Debra Henderson does have several comorbidities, but overall is feeling well today.  REVIEW OF SYSTEMS: Debra Henderson has had no recent fevers, chills, night sweats, or hot flashes. Her energy level is fairly good. She's had no rashes or skin changes and denies any abnormal bruising or bleeding. She does have  poor circulation, and a history of claudication. She is status post stenting of the left common iliac artery as noted above. She does get tired quickly with exertion. She has no increased cough, phlegm production, increased shortness of breath, chest pain, or palpitations. She denies any nausea, emesis, or change in bowel habits. She has some occasional incontinence. She denies any abnormal headaches or dizziness. She currently denies any unusual myalgias, arthralgias, or bony pain.  A detailed review of systems is otherwise stable and noncontributory.    PAST MEDICAL HISTORY: Past Medical History  Diagnosis Date  . Peripheral vascular disease     prior stenting of left iliac artery  . Hypertension   . Chronic kidney disease     renal artery stenosis  . Cancer 2014    breast cancer  . Family history of anesthesia complication     my brother is difficult to put to sleep also  . Complication of anesthesia     ' It does not work " I am difficult to put tpo sleep  . GERD (gastroesophageal reflux disease)     otc  . Breast cancer 02/13/12    right upper outer- DCIS, ER/PR+  . Hyperlipidemia   . Claudication     lower extremities  . History of renal stent     LEA DUPLEX, 03/16/2012 - LEFT EIA DISTAL/COMMON FEMORAL ARTERY-demonstrated occlusive disease  . Renal artery stenosis     RENAL DOPPLER, 02/12/2011 - RIGHT RENAL ARTERY 60-99% diameter reduction, LEFT RENAL ARTERY AT STENT 60-99% diameter reduction  . Radiation 08/02/12-08/23/12    Right breast 42.72 Gy x 16 fx    PAST SURGICAL HISTORY: Past Surgical History  Procedure Laterality Date  . Tubal ligation      1980's  . Fibroid breast      left breast-adenoma benign 1980's  . Iliac artery stent  11/21/19/02    left   . Stent /kidney  12/10/00,08/04/06    2002 R renal artery, 2008 L renal  . Bladder suspension  1980's  . Angioplasty illiac artery  02/24/2012    Dr Gwenlyn Found  L iliac restent  . Breast lumpectomy with needle  localization Right 04/22/2012    Procedure: RIGHT BREAST WIRE LOCALIZATION  LUMPECTOMY ;  Surgeon: Merrie Roof, MD;  Location: [facility];  Service: General;  Laterality: Right;  . Peripheral vascular catheterization Left 02/24/2012    Common iliac artery, 8x38 iCast Stent, resulting in a reduction from 80% in-stent restenosis to 0% residual  . Peripheral vascular catheterization Left 08/04/2006    Renal 90% stenosis, 3.5x 13 drug-eluting stent resulting in a reduction of 90% in-stent restenosis to 0% residual  . Peripheral vascular catheterization Left 05/22/2005    Renal 80% in-stent stenosis, 5x15 Aviator resulting in a reduction of 80% in-stent restenosis to 0% residual  . Peripheral vascular catheterization Left 02/22/2002    Renal in-stent restenosis, 5x15 Guidant rapid-exchange balloon resulting in reduction of a 95% in-stent restenosis to less than 20% residual  . Peripheral vascular catheterization Left 12/10/2000    95% renal stenosis, 6x47m Genesis Aviator balloon/stent deployed at 10 atmospheres resulting in a reduction  of 95% stenosis to 0% residual; Common iliac artery stenosis, P12x4 mounted on a 7x2 Powerflex balloon resulting in a reduction of 80% to 0% residual  . Peripheral vascular catheterization Left 07/27/2000    95% renal stenosis, 738mx 6cm Smart stent resulting in a reduction of 90-95%  to 0% residual  . Nm myoview ltd  03/21/2010    normal myocardial perfusion study    FAMILY HISTORY (reviewed 06/07/2013) Family History  Problem Relation Age of Onset  . Cancer Mother     colon  . COPD Mother   . Cancer Father     brian  . Stroke Sister   . Heart attack Sister   Mother died at the age of 8587Father died at the age of 8435She has one older brother who has Crohn's disease. She has a younger sister who has multiple health issues secondary to having been a premature infant.  There is no known history of breast or GYN cancer in the family.  GYNECOLOGIC HISTORY:   (Reviewed  06/07/2013) [id number];  she took birth control pills for approximately 2 years in her [age]sith good tolerance. 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 Her husband, Ron old, is retired. They have 2 daughters, one who lives locally, [place] 2 grandchildren, ages 2049 monthsnd 1539ears.    ADVANCED DIRECTIVES:    HEALTH MAINTENANCE:  (Reviewed 06/07/2013) History  Substance Use Topics  . Smoking status: Former Smoker -- 1.00 packs/day    Types: Cigarettes    Quit date: 02/24/1988  . Smokeless tobacco: Never Used     Comment: quit 1990  . Alcohol Use: No     Colonoscopy: Not on file  PAP: Not on file  Bone density: 2008, normal  Lipid panel: Not on file  Allergies  Allergen Reactions  . Erythromycin   . Tetracyclines & Related Nausea And Vomiting and Other (See Comments)  Vomiting blood  . Vytorin [Ezetimibe-Simvastatin]     Per Dr Adora Fridge note  . Chicken Allergy Diarrhea and Rash  . Eggs Or Egg-Derived Products Diarrhea and Rash  . Lipitor [Atorvastatin] Rash and Other (See Comments)    Myalgias   . Penicillins Swelling and Rash  . Sulfa Antibiotics Swelling and Rash    Current Outpatient Prescriptions  Medication Sig Dispense Refill  . alum & mag hydroxide-simeth (MAALOX/MYLANTA) 200-200-20 MG/5ML suspension Take by mouth every 6 (six) hours as needed for indigestion.      Marland Kitchen aspirin 81 MG tablet Take 81 mg by mouth daily.      . clopidogrel (PLAVIX) 75 MG tablet Take 75 mg by mouth daily.      Marland Kitchen losartan-hydrochlorothiazide (HYZAAR) 50-12.5 MG per tablet TAKE 1 TABLET BY MOUTH DAILY.  30 tablet  6  . omega-3 acid ethyl esters (LOVAZA) 1 G capsule Take 1 g by mouth 2 (two) times daily.      . rosuvastatin (CRESTOR) 10 MG tablet Take 10 mg by mouth every evening.       . hyaluronate sodium (RADIAPLEXRX) GEL Apply 1 application topically 2 (two) times  daily. Apply after rad tx and bedtime       No current facility-administered medications for this visit.    OBJECTIVE: Filed Vitals:   06/06/13 1333  BP: 115/68  Pulse: 89  Temp: 97.8 F (36.6 C)  Resp: 18     Body mass index is 28.16 kg/(m^2).    ECOG FS: 0 Filed Weights   06/06/13 1333  Weight: 144 lb 3.2 oz (65.409 kg)   Physical Exam: HEENT:  Sclerae anicteric.  Oropharynx clear and moist. Neck supple, trachea midline.  NODES:  No cervical or supraclavicular lymphadenopathy palpated.  BREAST EXAM:  Right breast is status post lumpectomy. No suspicious nodularities or skin changes, and no evidence of local recurrence. Left breast is unremarkable. Axillae are benign bilaterally, no palpable lymphadenopathy. LUNGS:  Clear to auscultation bilaterally.  No wheezes or rhonchi HEART:  Regular rate and rhythm. No murmur  ABDOMEN:  Soft, nontender. No organomegaly or masses. Positive bowel sounds.  MSK:  No focal spinal tenderness to palpation. Good range of motion bilaterally in the upper extremities. EXTREMITIES:  No peripheral edema.  No lymphedema in the right upper extremity. SKIN:  No visible rashes. No excessive ecchymoses. No petechiae. No pallor. Good skin turgor. NEURO:  Nonfocal. Well oriented.  Appropriate affect.     LAB RESULTS:  CMP    Lab Results  Component Value Date   WBC 10.8* 06/01/2012   NEUTROABS 6.1 05/20/2012   HGB 12.5 06/01/2012   HCT 35.7* 06/01/2012   MCV 92.7 06/01/2012   PLT 304 06/01/2012      Chemistry      Component Value Date/Time   NA 133* 06/01/2012 1120   NA 137 05/20/2012 1454   K 3.4* 06/01/2012 1120   K 3.9 05/20/2012 1454   CL 96 06/01/2012 1120   CL 100 05/20/2012 1454   CO2 24 06/01/2012 1120   CO2 29 05/20/2012 1454   BUN 11 06/01/2012 1120   BUN 7.1 05/20/2012 1454   CREATININE 0.61 06/01/2012 1120   CREATININE 0.8 05/20/2012 1454      Component Value Date/Time   CALCIUM 9.3 06/01/2012 1120   CALCIUM 9.4 05/20/2012 1454   ALKPHOS 85  05/20/2012 1454   AST 10 05/20/2012 1454   ALT 10 05/20/2012 1454   BILITOT 0.30 05/20/2012 1454  STUDIES:  Patient's last bilateral mammogram was in February 2013 at the time of diagnosis. She is due for a bilateral mammogram at this time.  Her most recent bone density was at [facility] in 2008, normal.ie Penn    ASSESSMENT: 68 y.o. Debra Henderson woman:  1)  status post lumpectomy in April 2014 over the care of Dr. Marlou Starks, final pathology showing a low-grade ductal carcinoma in situ with a 0.1 cm focus of disease. Focally close to the posterior margin at 0.1 cm. ER +100%, PR +100%. Also noted was atypical lobular hyperplasia.  2)  Status post radiation therapy under the care of Dr. Pablo Ledger, completed 08/23/2012  3)  ready to discuss oral antiestrogen therapy  4)  multiple comorbidities including peripheral vascular disease with stenting of the left iliac artery; hypertension; claudication; and a history of renal artery stenosis, with stenting of the left renal artery.  PLAN: The majority of our 50 minute appointment today was spent reviewing the patient's original diagnosis, discussing her original pathology, reviewing options for treatment with anti-estrogen therapy, and coordinating care. Dr. Jana Hakim also spoke with the patient today.  Debra Henderson is in agreement with trying an oral antiestrogen. We reviewed the possibilities of either tamoxifen or an aromatase inhibitor. With her history of peripheral vascular disease and stenting, Dr. Jana Hakim and I both believe it would be prudent to avoid tamoxifen. Accordingly, we have discussed starting with anastrozole, 1 mg daily, for the next 5 years.  I reviewed all of the possible side effects with Debra Henderson, including hot flashes, joint pain, vaginal dryness, and worsened bone density. She was also given all of this information in writing today.  I have prescribed the anastrozole for Debra Henderson today, and we will plan on seeing her back to assess tolerance in  early September. If she is tolerating the anastrozole well at that point, we will order a bone density for a new baseline. In the meanwhile, we're also requesting a bilateral mammogram be obtained prior to her appointment in September.   The above plan was reviewed in detail with Debra Henderson today. She voices her understanding and her agreement with our plan, and knows to call with any changes or problems prior to her next appointment.  Debra Burrow, Debra Henderson   06/07/2013 9:16 AM    ADDENDUM: I met with Debra Henderson today to go over her situation in detail. She understands that in noninvasive breast cancer is not in itself life threatening. If the cancer comes back, it would have to be in the same breast. She has received radiation therapy to decrease that risk and at this point her risk of local recurrence is low enough that as far as that is concerned I don't think any further intervention would be needed.  She however may develop a new breast cancer in either breast, and the risk of that is approximately 1% per year. Again, this is a low risk. Antiestrogen therapy would make it even lower, essentially cutting that risk in half.  We then discussed the possible toxicities, side effects and complications of anti-estrogens and specifically the aromatase inhibitors.  After much discussion the patient is interested in proceeding to antiestrogen therapy with anastrozole. She understands this drug does not increase the risk of blood clots. In studies comparing anastrozole with placebo, and the risk of strokes and heart attacks was identical in both arms.  The patient had a bone density in 2008 which was normal. This probably should be repeated once we have established that she will tolerate the  anastrozole.  Debra Henderson is a good understanding of the overall plan. She agrees with it. She knows a goal of treatment in her case as prevention. She will call with any problems that may develop before her next visit here.  I  personally saw this patient and performed a substantive portion of this encounter with the listed APP documented above.   Chauncey Cruel, MD

## 2013-06-17 ENCOUNTER — Ambulatory Visit
Admission: RE | Admit: 2013-06-17 | Discharge: 2013-06-17 | Disposition: A | Discharge status: Home / Self Care | Payer: Medicare Other | Source: Ambulatory Visit | Attending: Physician Assistant | Admitting: Physician Assistant

## 2013-06-17 ENCOUNTER — Other Ambulatory Visit: Payer: Self-pay | Admitting: Physician Assistant

## 2013-06-17 DIAGNOSIS — N6310 Unspecified lump in the right breast, unspecified quadrant: Secondary | ICD-10-CM

## 2013-06-17 DIAGNOSIS — Z853 Personal history of malignant neoplasm of breast: Secondary | ICD-10-CM

## 2013-06-17 DIAGNOSIS — R921 Mammographic calcification found on diagnostic imaging of breast: Secondary | ICD-10-CM

## 2013-07-01 ENCOUNTER — Ambulatory Visit (INDEPENDENT_AMBULATORY_CARE_PROVIDER_SITE_OTHER): Payer: Medicare Other | Admitting: General Surgery

## 2013-07-01 ENCOUNTER — Encounter (INDEPENDENT_AMBULATORY_CARE_PROVIDER_SITE_OTHER): Payer: Self-pay | Admitting: General Surgery

## 2013-07-01 VITALS — BP 124/78 | HR 76 | Temp 97.7°F | Ht 60.0 in | Wt 145.0 lb

## 2013-07-01 DIAGNOSIS — D0510 Intraductal carcinoma in situ of unspecified breast: Secondary | ICD-10-CM

## 2013-07-01 DIAGNOSIS — D0590 Unspecified type of carcinoma in situ of unspecified breast: Secondary | ICD-10-CM

## 2013-07-01 NOTE — Patient Instructions (Signed)
Will reexamine in 3 months Follow up mammogram in 6 months

## 2013-07-01 NOTE — Progress Notes (Signed)
Subjective:     Patient ID: Debra Henderson, female   DOB: 1945-12-18, 68 y.o.   MRN: [id number]  HPI The patient is a 68 year old white female who is one year and 3 months status post right lumpectomy for DCIS. She was ER PR positive. She received radiation therapy but has decided not to enter the estrogen therapy. She recently went for her one-year followup mammogram at which time an abnormality was seen in the medial right breast. This was biopsied and came back as a complex sclerosing lesion. There was no atypia associated with it.  Review of Systems  Constitutional: Negative.   HENT: Negative.   Eyes: Negative.   Respiratory: Negative.   Cardiovascular: Negative.   Gastrointestinal: Negative.   Endocrine: Negative.   Genitourinary: Negative.   Musculoskeletal: Negative.   Skin: Negative.   Allergic/Immunologic: Negative.   Neurological: Negative.   Hematological: Negative.   Psychiatric/Behavioral: Negative.        Objective:   Physical Exam  Constitutional: She is oriented to person, place, and time. She appears well-developed and well-nourished.  HENT:  Head: Normocephalic and atraumatic.  Eyes: Conjunctivae and EOM are normal. Pupils are equal, round, and reactive to light.  Neck: Normal range of motion. Neck supple.  Cardiovascular: Normal rate, regular rhythm and normal heart sounds.   Pulmonary/Chest: Effort normal and breath sounds normal.  There is a small palpable bruise in the medial 3:00 right breast. Other than this there is no other palpable mass in either breast. There is no palpable axillary, supraclavicular, or cervical lymphadenopathy.  Abdominal: Soft. Bowel sounds are normal.  Musculoskeletal: Normal range of motion.  Lymphadenopathy:    She has no cervical adenopathy.  Neurological: She is alert and oriented to person, place, and time.  Skin: Skin is warm and dry.  Psychiatric: She has a normal mood and affect. Her behavior is normal.        Assessment:     The patient is one year and 3 months status post right lumpectomy for DCIS. She recently had a biopsy of the right breast showed an area of complex sclerosing lesion with no atypia. I have talked to her at length about what this means. I think The Standard answer would be to remove this area. That being said I think the fact that she has had DCIS in this breast would be the driving force to define her risk of getting another cancer. She is actually not really interested right now having more surgery done.    Plan:     For these reasons I think it would be reasonable to see her back in 3 month followup for reexamination. I think it would be reasonable to repeat her mammogram in 6 months. We will already be following her for life very closely given her diagnosis of DCIS. If anything should change then we Would plan to remove this area in the medial right breast. She will continue to do regular self exams and call us if anything changes

## 2013-09-01 ENCOUNTER — Other Ambulatory Visit: Payer: Self-pay | Admitting: Cardiovascular Disease

## 2013-09-02 NOTE — Telephone Encounter (Signed)
Rx was sent to pharmacy electronically. 

## 2013-09-30 ENCOUNTER — Other Ambulatory Visit: Payer: Self-pay | Admitting: *Deleted

## 2013-09-30 DIAGNOSIS — C50911 Malignant neoplasm of unspecified site of right female breast: Secondary | ICD-10-CM

## 2013-10-03 ENCOUNTER — Ambulatory Visit (HOSPITAL_BASED_OUTPATIENT_CLINIC_OR_DEPARTMENT_OTHER): Payer: Medicare Other | Admitting: Oncology

## 2013-10-03 ENCOUNTER — Other Ambulatory Visit (HOSPITAL_BASED_OUTPATIENT_CLINIC_OR_DEPARTMENT_OTHER): Payer: Medicare Other

## 2013-10-03 VITALS — BP 124/59 | HR 89 | Temp 98.6°F | Resp 20 | Ht 60.0 in | Wt 145.0 lb

## 2013-10-03 DIAGNOSIS — M899 Disorder of bone, unspecified: Secondary | ICD-10-CM

## 2013-10-03 DIAGNOSIS — C50911 Malignant neoplasm of unspecified site of right female breast: Secondary | ICD-10-CM

## 2013-10-03 DIAGNOSIS — D0590 Unspecified type of carcinoma in situ of unspecified breast: Secondary | ICD-10-CM

## 2013-10-03 DIAGNOSIS — Z170 Estrogen receptor positive status [ER+]: Secondary | ICD-10-CM

## 2013-10-03 DIAGNOSIS — D0511 Intraductal carcinoma in situ of right breast: Secondary | ICD-10-CM

## 2013-10-03 DIAGNOSIS — M949 Disorder of cartilage, unspecified: Secondary | ICD-10-CM

## 2013-10-03 LAB — CBC WITH DIFFERENTIAL/PLATELET
BASO%: 0.4 % (ref 0.0–2.0)
Basophils Absolute: 0 10*3/uL (ref 0.0–0.1)
EOS%: 0.7 % (ref 0.0–7.0)
Eosinophils Absolute: 0.1 10*3/uL (ref 0.0–0.5)
HCT: 40.2 % (ref 34.8–46.6)
HGB: 13.4 g/dL (ref 11.6–15.9)
LYMPH%: 27.2 % (ref 14.0–49.7)
MCH: 32.7 pg (ref 25.1–34.0)
MCHC: 33.3 g/dL (ref 31.5–36.0)
MCV: 98 fL (ref 79.5–101.0)
MONO#: 0.7 10*3/uL (ref 0.1–0.9)
MONO%: 8.4 % (ref 0.0–14.0)
NEUT#: 5.4 10*3/uL (ref 1.5–6.5)
NEUT%: 63.3 % (ref 38.4–76.8)
Platelets: 277 10*3/uL (ref 145–400)
RBC: 4.11 10*6/uL (ref 3.70–5.45)
RDW: 14 % (ref 11.2–14.5)
WBC: 8.5 10*3/uL (ref 3.9–10.3)
lymph#: 2.3 10*3/uL (ref 0.9–3.3)

## 2013-10-03 LAB — COMPREHENSIVE METABOLIC PANEL (CC13)
ALT: 9 U/L (ref 0–55)
AST: 11 U/L (ref 5–34)
Albumin: 3.5 g/dL (ref 3.5–5.0)
Alkaline Phosphatase: 76 U/L (ref 40–150)
Anion Gap: 10 mEq/L (ref 3–11)
BUN: 7.2 mg/dL (ref 7.0–26.0)
CO2: 27 mEq/L (ref 22–29)
Calcium: 9.5 mg/dL (ref 8.4–10.4)
Chloride: 101 mEq/L (ref 98–109)
Creatinine: 0.8 mg/dL (ref 0.6–1.1)
Glucose: 93 mg/dl (ref 70–140)
Potassium: 3.2 mEq/L — ABNORMAL LOW (ref 3.5–5.1)
Sodium: 138 mEq/L (ref 136–145)
Total Bilirubin: 0.31 mg/dL (ref 0.20–1.20)
Total Protein: 7.5 g/dL (ref 6.4–8.3)

## 2013-10-03 NOTE — Progress Notes (Signed)
[place]  [phone] [phone]     ID: Debra Henderson OB: 1945/11/27  MR#: [id number]  RCV#:[id number]  PCP: Glo Herring., MD GYN:   SU:  Autumn Messing, MD OTHER MD:  Thea Silversmith, MD; Quay Burow, MD  CHIEF COMPLAINT:  Hx of Right Breast ductal carcinoma in situ CURRENT TREATMENT: Tamoxifen   BREAST CANCER HISTORY: From the original intake note:   Debra Henderson underwent screening mammography in December 2013. Calcifications were noted in the right breast, requiring further evaluation.  Subsequently, a stereotactic right breast biopsy was obtained on 02/13/2012 which confirmed ductal carcinoma in situ with calcifications, low grade, ER and PR positive, both at 100%. [phone])  The patient underwent a right lumpectomy under the care of Dr. Marlou Starks on 04/22/2012. (Surgery have been delayed only slightly secondary to the patient's cardiac history and the requirements of restaging of the left common iliac artery prior to surgery.) Final pathology showed a low-grade ductal carcinoma in situ with a 0.1 cm focus of disease, and close to the posterior margin at 0.1 cm.  Atypical lobular hyperplasia was also noted. PTis, pNX. [phone]).  The patient was treated with adjuvant radiation therapy under the care of Dr. Pablo Ledger, completed 08/23/2012.  Her subsequent history is as detailed below.     INTERVAL HISTORY: Debra Henderson returns today for followup of her noninvasive breast cancer. Since her last visit here she had biopsy of the right breast upper inner quadrant 06/17/2013, which showed ([id number]) and a complex sclerosing lesion. The patient was referred to Dr. Marlou Starks, and has been offered surgery. However she prefers followup and she is scheduled for mammography every 6 months, with her next mammogram due in November.  REVIEW OF SYSTEMS: Tomasita has chronic claudication problems. Otherwise a detailed review of systems today was entirely  negative   PAST MEDICAL HISTORY: Past Medical History  Diagnosis Date  . Peripheral vascular disease     prior stenting of left iliac artery  . Hypertension   . Chronic kidney disease     renal artery stenosis  . Cancer 2014    breast cancer  . Family history of anesthesia complication     my brother is difficult to put to sleep also  . Complication of anesthesia     ' It does not work " I am difficult to put tpo sleep  . GERD (gastroesophageal reflux disease)     otc  . Breast cancer 02/13/12    right upper outer- DCIS, ER/PR+  . Hyperlipidemia   . Claudication     lower extremities  . History of renal stent     LEA DUPLEX, 03/16/2012 - LEFT EIA DISTAL/COMMON FEMORAL ARTERY-demonstrated occlusive disease  . Renal artery stenosis     RENAL DOPPLER, 02/12/2011 - RIGHT RENAL ARTERY 60-99% diameter reduction, LEFT RENAL ARTERY AT STENT 60-99% diameter reduction  . Radiation 08/02/12-08/23/12    Right breast 42.72 Gy x 16 fx    PAST SURGICAL HISTORY: Past Surgical History  Procedure Laterality Date  . Tubal ligation      1980's  . Fibroid breast      left breast-adenoma benign 1980's  . Iliac artery stent  11/21/19/02    left   . Stent /kidney  12/10/00,08/04/06    2002 R renal artery, 2008 L renal  . Bladder suspension  1980's  . Angioplasty illiac artery  02/24/2012    Dr Gwenlyn Found  L iliac restent  . Breast lumpectomy with needle localization Right 04/22/2012  Procedure: RIGHT BREAST WIRE LOCALIZATION  LUMPECTOMY ;  Surgeon: Merrie Roof, MD;  Location: [facility];  Service: General;  Laterality: Right;  . Peripheral vascular catheterization Left 02/24/2012    Common iliac artery, 8x38 iCast Stent, resulting in a reduction from 80% in-stent restenosis to 0% residual  . Peripheral vascular catheterization Left 08/04/2006    Renal 90% stenosis, 3.5x 13 drug-eluting stent resulting in a reduction of 90% in-stent restenosis to 0% residual  . Peripheral vascular catheterization Left  05/22/2005    Renal 80% in-stent stenosis, 5x15 Aviator resulting in a reduction of 80% in-stent restenosis to 0% residual  . Peripheral vascular catheterization Left 02/22/2002    Renal in-stent restenosis, 5x15 Guidant rapid-exchange balloon resulting in reduction of a 95% in-stent restenosis to less than 20% residual  . Peripheral vascular catheterization Left 12/10/2000    95% renal stenosis, 6x70mm Genesis Aviator balloon/stent deployed at 10 atmospheres resulting in a reduction  of 95% stenosis to 0% residual; Common iliac artery stenosis, P12x4 mounted on a 7x2 Powerflex balloon resulting in a reduction of 80% to 0% residual  . Peripheral vascular catheterization Left 07/27/2000    95% renal stenosis, 79mm x 6cm Smart stent resulting in a reduction of 90-95%  to 0% residual  . Nm myoview ltd  03/21/2010    normal myocardial perfusion study    FAMILY HISTORY (reviewed 06/07/2013) Family History  Problem Relation Age of Onset  . Cancer Mother     colon  . COPD Mother   . Cancer Father     brian  . Stroke Sister   . Heart attack Sister   Mother died at the age of 36. Father died at the age of 27. She has one older brother who has Crohn's disease. She has a younger sister who has multiple health issues secondary to having been a premature infant.  There is no known history of breast or GYN cancer in the family.  GYNECOLOGIC HISTORY:   (Reviewed 06/07/2013) [place] P2;  she took birth control pills for approximately 2 years in her 77s with good tolerance. She went through menopause naturally, approximately age 45.  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 Her husband, Debra Henderson, is retired. They have 2 daughters, one who lives locally, another in [place]. She has 2 grandchildren, ages 50 months and 82 years.    ADVANCED DIRECTIVES: Not in place   HEALTH MAINTENANCE:  (Reviewed 06/07/2013) History  Substance Use Topics  . Smoking status: Former Smoker --  1.00 packs/day    Types: Cigarettes    Quit date: 02/24/1988  . Smokeless tobacco: Never Used     Comment: quit 1990  . Alcohol Use: No     Colonoscopy: Not on file  PAP: Not on file  Bone density: 2008, normal  Lipid panel: Not on file  Allergies  Allergen Reactions  . Erythromycin   . Tetracyclines & Related Nausea And Vomiting and Other (See Comments)    Vomiting blood  . Vytorin [Ezetimibe-Simvastatin]     Per Dr Adora Fridge note  . Chicken Allergy Diarrhea and Rash  . Eggs Or Egg-Derived Products Diarrhea and Rash  . Lipitor [Atorvastatin] Rash and Other (See Comments)    Myalgias   . Penicillins Swelling and Rash  . Sulfa Antibiotics Swelling and Rash    Current Outpatient Prescriptions  Medication Sig Dispense Refill  . aspirin 81 MG tablet Take 81 mg by mouth daily.      Marland Kitchen  clopidogrel (PLAVIX) 75 MG tablet Take 75 mg by mouth daily.      . hyaluronate sodium (RADIAPLEXRX) GEL Apply 1 application topically 2 (two) times daily. Apply after rad tx and bedtime      . losartan-hydrochlorothiazide (HYZAAR) 50-12.5 MG per tablet Take 1 tablet by mouth daily. <please make appointment for future refills>  30 tablet  1  . omega-3 acid ethyl esters (LOVAZA) 1 G capsule Take 1 g by mouth 2 (two) times daily.      . rosuvastatin (CRESTOR) 10 MG tablet Take 10 mg by mouth every evening.        No current facility-administered medications for this visit.    OBJECTIVE: Middle-aged white woman who appears older than stated age 68 Vitals:   10/03/13 1512  BP: 124/59  Pulse: 89  Temp: 98.6 F (37 C)  Resp: 20     Body mass index is 28.32 kg/(m^2).    ECOG FS: 1 Filed Weights   10/03/13 1512  Weight: 145 lb (65.772 kg)   Sclerae unicteric, EOMs intact Oropharynx clear and moist No cervical or supraclavicular adenopathy Lungs no rales or rhonchi Heart regular rate and rhythm Abd soft, nontender, positive bowel sounds MSK no focal spinal tenderness, no upper extremity  lymphedema Neuro: nonfocal, well oriented, appropriate affect Breasts: The right breast is status post lumpectomy. I do not palpate any suspicious masses. There are no skin or nipple changes of concern. The right axilla is benign. The left breast is unremarkable.  LAB RESULTS:  CMP    Lab Results  Component Value Date   WBC 8.5 10/03/2013   NEUTROABS 5.4 10/03/2013   HGB 13.4 10/03/2013   HCT 40.2 10/03/2013   MCV 98.0 10/03/2013   PLT 277 10/03/2013      Chemistry      Component Value Date/Time   NA 133* 06/01/2012 1120   NA 137 05/20/2012 1454   K 3.4* 06/01/2012 1120   K 3.9 05/20/2012 1454   CL 96 06/01/2012 1120   CL 100 05/20/2012 1454   CO2 24 06/01/2012 1120   CO2 29 05/20/2012 1454   BUN 11 06/01/2012 1120   BUN 7.1 05/20/2012 1454   CREATININE 0.61 06/01/2012 1120   CREATININE 0.8 05/20/2012 1454      Component Value Date/Time   CALCIUM 9.3 06/01/2012 1120   CALCIUM 9.4 05/20/2012 1454   ALKPHOS 85 05/20/2012 1454   AST 10 05/20/2012 1454   ALT 10 05/20/2012 1454   BILITOT 0.30 05/20/2012 1454       STUDIES: Lura Em, MD Tue Jun 21, 2013 3:35:38 PM EDT       ADDENDUM REPORT: 06/21/2013 15:33  ADDENDUM:  Final pathology demonstrates A SCLEROSING LESION.  Histology correlates with imaging findings.  The patient was contacted by phone on 06/21/2013 and these results  given to her which she understood. Her questions were answered.  The patient had no complaints with her biopsy site. .  Recommend surgical consultation for excision. An appointment with  Dr. Marlou Starks [facility] surgery) has been scheduled for  07/01/2013 and the patient informed.  Electronically Signed  By: Hassan Rowan M.D.  On: 06/21/2013 15:33       Study Result    CLINICAL DATA: Distortion, right breast  EXAM:  RIGHT BREAST STEREOTACTIC CORE NEEDLE BIOPSY  COMPARISON: Previous exams.  FINDINGS:  The patient and I discussed the procedure of stereotactic-guided  biopsy including benefits and  alternatives. We discussed the high  likelihood  of a successful procedure. We discussed the risks of the  procedure including infection, bleeding, tissue injury, clip  migration, and inadequate sampling. Informed written consent was  given. The usual time out protocol was performed immediately prior  to the procedure.  Using sterile technique and 2% Lidocaine as local anesthetic, under  stereotactic guidance, a 12 gauge vacuum assisted device was used to  perform core needle biopsy of calcifications in the lower inner  quadrant of the right breast using an inferior approach. Specimen  radiograph was not done.  At the conclusion of the procedure, a X shaped tissue marker clip  was deployed into the biopsy cavity. Follow-up 2-view mammogram  confirmed clip in adequate position.  IMPRESSION:  Stereotactic-guided biopsy of distortion, right breast. No apparent  complications.  Electronically Signed:  By: Duke Salvia M.D.  On: 06/17/2013 16:13       Patient: AASHVI, REZABEK Collected: 06/17/2013 Client: [facility] Accession: [id number] Received: 06/17/2013 Bryna Colander, MD DOB: [birth year] Age: 61 Gender: F Reported: 06/21/2013 [place] Patient Ph: [phone] MRN #: [id number] [place], [place] [address] Client Acc#: Chart #: [id number] Phone: [phone] Fax: CC: Amy Gwenlyn Found, PA-C [name] REPORT OF SURGICAL PATHOLOGY FINAL DIAGNOSIS Diagnosis Breast, right, needle core biopsy, 3 o'clock - BENIGN BREAST TISSUE WITH COMPLEX SCLEROSING LESION, SEE COMMENT. - NEGATIVE FOR ATYPIA OR MALIGNANCY. - CALCIFICATIONS IDENTIFIED. Microscopic Comment The previous lumpectomy demonstrating ductal carcinoma in situ is noted ([id number]). Multiple needle core biopsies from the current case demonstrate patchy areas of dense stromal fibroelastosis and sclerosis. Within the areas of sclerosis there are angulated glands that demonstrate a myoepithelial  layer with smooth muscle myosin heavy chain, calponin, and p63 immunostains. There are additional areas of sclerosing adenosis. Microcalcifications are present within benign lobules. There are no features of atypia or malignancy present. Given that the current biopsies are not from the previous lumpectomy site, sampling of a complex sclerosing lesion/radial scar is favored. Excision is recommended to further characterize the lesion present. (CRR:gt, 06/21/13) Mali RUND DO Pathologist, Electronic Signature (Case signed 06/21/2013) Specimen Gross and Clinical Information  ASSESSMENT: 68 y.o. [place] woman:  1)  status post  left lumpectomy April 2014 for a low-grade ductal carcinoma in situ. Focally close to the posterior margin at 0.1 cm. ER +100%, PR +100%.  2) Status post radiation therapy under the care of Dr. Pablo Ledger, completed 08/23/2012  3) decided against antiestrogens June 2015  4)  multiple comorbidities including peripheral vascular disease with stenting of the left iliac artery; hypertension; claudication; and a history of renal artery stenosis, with stenting of the left renal artery.  5) right breast upper inner quadrant biopsy 06/17/2013 showed a complex sclerosing lesion  PLAN: I had understood Angele to have agreed to anastrozole at her last visit here, but her understanding was the opposite and she never started that medication.  In fact she really wonders why she needs to be seen by me. She is seeing Dr. Marlou Starks every 3 months, in addition to seeing her primary care physician and her gynecologist.  To review, she had a microscopic area of ductal carcinoma in situ removed originally. More recently she had a complex sclerosing lesion biopsy from the right breast. Surgery was recommended but she prefers followup and she is having mammograms every 6 months.  I am not uncomfortable releasing Grainne from followup here. She is very close followup already as just outlined. We keep  her records for an additional 10 years so  if there is any reason for me to see her she knows that we'll be available.  Accordingly we are not making any further appointments for Faiza here at this time. She understands this sclerosing lesion needs to be dealt with and the simplest way to do that is to have it excised. She prefers not to do that at this time. From a breast cancer screening point of view, although she would need is yearly mammography and yearly physician breast exam.   Chauncey Cruel, MD   10/03/2013 3:36 PM   Chauncey Cruel, MD

## 2013-11-19 ENCOUNTER — Other Ambulatory Visit: Payer: Self-pay | Admitting: Cardiovascular Disease

## 2013-12-29 ENCOUNTER — Encounter (HOSPITAL_COMMUNITY): Payer: Self-pay | Admitting: Cardiovascular Disease

## 2014-01-09 ENCOUNTER — Other Ambulatory Visit: Payer: Self-pay | Admitting: Cardiovascular Disease

## 2014-01-09 NOTE — Telephone Encounter (Signed)
Rx refill sent to patient pharmacy   

## 2014-09-21 ENCOUNTER — Other Ambulatory Visit: Payer: Self-pay | Admitting: General Surgery

## 2014-09-21 DIAGNOSIS — Z853 Personal history of malignant neoplasm of breast: Secondary | ICD-10-CM

## 2014-09-21 DIAGNOSIS — Z9889 Other specified postprocedural states: Secondary | ICD-10-CM

## 2014-09-26 ENCOUNTER — Encounter: Payer: Self-pay | Admitting: Cardiovascular Disease

## 2015-02-12 DIAGNOSIS — N3946 Mixed incontinence: Secondary | ICD-10-CM | POA: Diagnosis not present

## 2015-02-12 DIAGNOSIS — R3129 Other microscopic hematuria: Secondary | ICD-10-CM | POA: Diagnosis not present

## 2015-02-12 DIAGNOSIS — R3915 Urgency of urination: Secondary | ICD-10-CM | POA: Diagnosis not present

## 2015-02-13 DIAGNOSIS — R3129 Other microscopic hematuria: Secondary | ICD-10-CM | POA: Diagnosis not present

## 2015-02-27 DIAGNOSIS — E782 Mixed hyperlipidemia: Secondary | ICD-10-CM | POA: Diagnosis not present

## 2015-02-27 DIAGNOSIS — R110 Nausea: Secondary | ICD-10-CM | POA: Diagnosis not present

## 2015-02-27 DIAGNOSIS — I10 Essential (primary) hypertension: Secondary | ICD-10-CM | POA: Diagnosis not present

## 2015-02-27 DIAGNOSIS — E663 Overweight: Secondary | ICD-10-CM | POA: Diagnosis not present

## 2015-02-27 DIAGNOSIS — Z1389 Encounter for screening for other disorder: Secondary | ICD-10-CM | POA: Diagnosis not present

## 2015-02-27 DIAGNOSIS — H01001 Unspecified blepharitis right upper eyelid: Secondary | ICD-10-CM | POA: Diagnosis not present

## 2015-02-27 DIAGNOSIS — Z6826 Body mass index (BMI) 26.0-26.9, adult: Secondary | ICD-10-CM | POA: Diagnosis not present

## 2015-03-14 DIAGNOSIS — H00021 Hordeolum internum right upper eyelid: Secondary | ICD-10-CM | POA: Diagnosis not present

## 2015-03-14 DIAGNOSIS — Z1389 Encounter for screening for other disorder: Secondary | ICD-10-CM | POA: Diagnosis not present

## 2015-03-14 DIAGNOSIS — Z0000 Encounter for general adult medical examination without abnormal findings: Secondary | ICD-10-CM | POA: Diagnosis not present

## 2015-03-14 DIAGNOSIS — Z6826 Body mass index (BMI) 26.0-26.9, adult: Secondary | ICD-10-CM | POA: Diagnosis not present

## 2015-04-10 DIAGNOSIS — Z6826 Body mass index (BMI) 26.0-26.9, adult: Secondary | ICD-10-CM | POA: Diagnosis not present

## 2015-04-10 DIAGNOSIS — R3129 Other microscopic hematuria: Secondary | ICD-10-CM | POA: Diagnosis not present

## 2015-04-10 DIAGNOSIS — E663 Overweight: Secondary | ICD-10-CM | POA: Diagnosis not present

## 2015-04-10 DIAGNOSIS — E781 Pure hyperglyceridemia: Secondary | ICD-10-CM | POA: Diagnosis not present

## 2015-04-10 DIAGNOSIS — E782 Mixed hyperlipidemia: Secondary | ICD-10-CM | POA: Diagnosis not present

## 2015-04-10 DIAGNOSIS — Z0000 Encounter for general adult medical examination without abnormal findings: Secondary | ICD-10-CM | POA: Diagnosis not present

## 2015-04-10 DIAGNOSIS — R3915 Urgency of urination: Secondary | ICD-10-CM | POA: Diagnosis not present

## 2015-04-10 DIAGNOSIS — N3946 Mixed incontinence: Secondary | ICD-10-CM | POA: Diagnosis not present

## 2015-04-10 DIAGNOSIS — Z1389 Encounter for screening for other disorder: Secondary | ICD-10-CM | POA: Diagnosis not present

## 2015-04-11 DIAGNOSIS — R3129 Other microscopic hematuria: Secondary | ICD-10-CM | POA: Diagnosis not present

## 2015-04-23 DIAGNOSIS — R938 Abnormal findings on diagnostic imaging of other specified body structures: Secondary | ICD-10-CM | POA: Diagnosis not present

## 2015-04-23 DIAGNOSIS — R3129 Other microscopic hematuria: Secondary | ICD-10-CM | POA: Diagnosis not present

## 2015-05-15 DIAGNOSIS — R3915 Urgency of urination: Secondary | ICD-10-CM | POA: Diagnosis not present

## 2015-05-15 DIAGNOSIS — R3129 Other microscopic hematuria: Secondary | ICD-10-CM | POA: Diagnosis not present

## 2015-06-26 DIAGNOSIS — R3129 Other microscopic hematuria: Secondary | ICD-10-CM | POA: Diagnosis not present

## 2015-06-26 DIAGNOSIS — R3915 Urgency of urination: Secondary | ICD-10-CM | POA: Diagnosis not present

## 2015-06-26 DIAGNOSIS — N3946 Mixed incontinence: Secondary | ICD-10-CM | POA: Diagnosis not present

## 2015-07-31 DIAGNOSIS — N3946 Mixed incontinence: Secondary | ICD-10-CM | POA: Diagnosis not present

## 2015-07-31 DIAGNOSIS — R3915 Urgency of urination: Secondary | ICD-10-CM | POA: Diagnosis not present

## 2015-09-04 DIAGNOSIS — N3946 Mixed incontinence: Secondary | ICD-10-CM | POA: Diagnosis not present

## 2015-09-04 DIAGNOSIS — R3915 Urgency of urination: Secondary | ICD-10-CM | POA: Diagnosis not present

## 2015-10-15 ENCOUNTER — Other Ambulatory Visit: Payer: Self-pay | Admitting: General Surgery

## 2015-10-15 DIAGNOSIS — Z9889 Other specified postprocedural states: Secondary | ICD-10-CM

## 2015-10-15 DIAGNOSIS — Z853 Personal history of malignant neoplasm of breast: Secondary | ICD-10-CM

## 2015-10-19 DIAGNOSIS — N6459 Other signs and symptoms in breast: Secondary | ICD-10-CM | POA: Diagnosis not present

## 2015-10-19 DIAGNOSIS — Z6827 Body mass index (BMI) 27.0-27.9, adult: Secondary | ICD-10-CM | POA: Diagnosis not present

## 2015-10-19 DIAGNOSIS — L309 Dermatitis, unspecified: Secondary | ICD-10-CM | POA: Diagnosis not present

## 2015-10-24 ENCOUNTER — Other Ambulatory Visit: Payer: Self-pay | Admitting: General Surgery

## 2015-10-24 ENCOUNTER — Ambulatory Visit
Admission: RE | Admit: 2015-10-24 | Discharge: 2015-10-24 | Disposition: A | Discharge status: Home / Self Care | Payer: Self-pay | Source: Ambulatory Visit | Attending: General Surgery | Admitting: General Surgery

## 2015-10-24 DIAGNOSIS — Z9889 Other specified postprocedural states: Secondary | ICD-10-CM

## 2015-10-24 DIAGNOSIS — Z853 Personal history of malignant neoplasm of breast: Secondary | ICD-10-CM

## 2015-10-24 DIAGNOSIS — N6012 Diffuse cystic mastopathy of left breast: Secondary | ICD-10-CM | POA: Diagnosis not present

## 2015-10-24 DIAGNOSIS — R922 Inconclusive mammogram: Secondary | ICD-10-CM | POA: Diagnosis not present

## 2015-11-22 DIAGNOSIS — Z1283 Encounter for screening for malignant neoplasm of skin: Secondary | ICD-10-CM | POA: Diagnosis not present

## 2015-11-22 DIAGNOSIS — L821 Other seborrheic keratosis: Secondary | ICD-10-CM | POA: Diagnosis not present

## 2015-11-22 DIAGNOSIS — L218 Other seborrheic dermatitis: Secondary | ICD-10-CM | POA: Diagnosis not present

## 2015-11-22 DIAGNOSIS — L258 Unspecified contact dermatitis due to other agents: Secondary | ICD-10-CM | POA: Diagnosis not present

## 2016-07-29 DIAGNOSIS — E782 Mixed hyperlipidemia: Secondary | ICD-10-CM | POA: Diagnosis not present

## 2016-07-29 DIAGNOSIS — E663 Overweight: Secondary | ICD-10-CM | POA: Diagnosis not present

## 2016-07-29 DIAGNOSIS — I701 Atherosclerosis of renal artery: Secondary | ICD-10-CM | POA: Diagnosis not present

## 2016-07-29 DIAGNOSIS — Z1389 Encounter for screening for other disorder: Secondary | ICD-10-CM | POA: Diagnosis not present

## 2016-07-29 DIAGNOSIS — Z0000 Encounter for general adult medical examination without abnormal findings: Secondary | ICD-10-CM | POA: Diagnosis not present

## 2016-07-29 DIAGNOSIS — I10 Essential (primary) hypertension: Secondary | ICD-10-CM | POA: Diagnosis not present

## 2016-07-29 DIAGNOSIS — Z6826 Body mass index (BMI) 26.0-26.9, adult: Secondary | ICD-10-CM | POA: Diagnosis not present

## 2016-11-18 ENCOUNTER — Other Ambulatory Visit: Payer: Self-pay | Admitting: Internal Medicine

## 2016-11-18 DIAGNOSIS — Z1231 Encounter for screening mammogram for malignant neoplasm of breast: Secondary | ICD-10-CM

## 2016-12-10 ENCOUNTER — Ambulatory Visit: Payer: Self-pay

## 2016-12-16 ENCOUNTER — Other Ambulatory Visit: Payer: Self-pay | Admitting: Internal Medicine

## 2016-12-16 ENCOUNTER — Ambulatory Visit
Admission: RE | Admit: 2016-12-16 | Discharge: 2016-12-16 | Disposition: A | Discharge status: Home / Self Care | Payer: PPO | Source: Ambulatory Visit | Attending: Internal Medicine | Admitting: Internal Medicine

## 2016-12-16 DIAGNOSIS — R928 Other abnormal and inconclusive findings on diagnostic imaging of breast: Secondary | ICD-10-CM | POA: Diagnosis not present

## 2016-12-16 DIAGNOSIS — Z1231 Encounter for screening mammogram for malignant neoplasm of breast: Secondary | ICD-10-CM

## 2016-12-16 DIAGNOSIS — Z853 Personal history of malignant neoplasm of breast: Secondary | ICD-10-CM

## 2016-12-16 DIAGNOSIS — N6489 Other specified disorders of breast: Secondary | ICD-10-CM | POA: Diagnosis not present

## 2016-12-16 DIAGNOSIS — N6320 Unspecified lump in the left breast, unspecified quadrant: Secondary | ICD-10-CM

## 2016-12-16 HISTORY — DX: Personal history of irradiation: Z92.3

## 2016-12-19 ENCOUNTER — Other Ambulatory Visit: Payer: PPO

## 2016-12-25 ENCOUNTER — Ambulatory Visit
Admission: RE | Admit: 2016-12-25 | Discharge: 2016-12-25 | Disposition: A | Discharge status: Home / Self Care | Payer: PPO | Source: Ambulatory Visit | Attending: Internal Medicine | Admitting: Internal Medicine

## 2016-12-25 ENCOUNTER — Other Ambulatory Visit: Payer: Self-pay | Admitting: Internal Medicine

## 2016-12-25 DIAGNOSIS — N6320 Unspecified lump in the left breast, unspecified quadrant: Secondary | ICD-10-CM

## 2016-12-25 DIAGNOSIS — Z853 Personal history of malignant neoplasm of breast: Secondary | ICD-10-CM

## 2016-12-25 DIAGNOSIS — D0512 Intraductal carcinoma in situ of left breast: Secondary | ICD-10-CM | POA: Diagnosis not present

## 2016-12-25 DIAGNOSIS — N6002 Solitary cyst of left breast: Secondary | ICD-10-CM | POA: Diagnosis not present

## 2016-12-25 HISTORY — PX: BREAST BIOPSY: SHX20

## 2016-12-30 DIAGNOSIS — Z1151 Encounter for screening for human papillomavirus (HPV): Secondary | ICD-10-CM | POA: Diagnosis not present

## 2016-12-30 DIAGNOSIS — Z01419 Encounter for gynecological examination (general) (routine) without abnormal findings: Secondary | ICD-10-CM | POA: Diagnosis not present

## 2017-01-08 ENCOUNTER — Ambulatory Visit: Payer: Self-pay | Admitting: General Surgery

## 2017-01-08 ENCOUNTER — Other Ambulatory Visit: Payer: Self-pay | Admitting: General Surgery

## 2017-01-08 DIAGNOSIS — D0512 Intraductal carcinoma in situ of left breast: Secondary | ICD-10-CM

## 2017-01-08 DIAGNOSIS — K921 Melena: Secondary | ICD-10-CM | POA: Diagnosis not present

## 2017-01-09 ENCOUNTER — Telehealth: Payer: Self-pay | Admitting: Oncology

## 2017-01-09 ENCOUNTER — Encounter: Payer: Self-pay | Admitting: Nurse Practitioner

## 2017-01-09 NOTE — Telephone Encounter (Signed)
Left message on voicemail for patient regarding appointment date/time/loc/phone#.

## 2017-01-26 NOTE — Progress Notes (Signed)
Debra Henderson  [phone] [phone]     ID: Debra Henderson OB: [birth year]  MR#: [id number]  HBZ#:[id number]  Patient Care Team: [name], MD as PCP - General (Internal Medicine) Jovita Kussmaul, MD as Consulting Physician (General Surgery) Loyed Wilmes, Virgie Dad, MD as Consulting Physician (Oncology) Gery Pray, MD as Consulting Physician (Radiation Oncology)    CHIEF COMPLAINT:  Hx of Right Breast ductal carcinoma in situ; new left breast DCIS  CURRENT TREATMENT:observation  INTERVAL HISTORY: Debra Henderson returns today for follow-up of her remote right breast noninvasive cancer, and her new left breast ductal carcinoma in situ.  She underwent diagnostic bilateral mammography with CAD and tomography and left breast ultrasonography on 12/16/2016 at [facility] showing: breast density category B. Indeterminate left breast multi-cystic mass. Findings likely represent fibrocystic changes. No suspicious left axillary lymphadenopathy. Stable right breast postsurgical changes without mammographic evidence of malignancy.  She then proceeded to left breast biopsy ([id number]) on 12/25/2016 showing: low grade ductal carcinoma in situ. Prognostic indicators significant for: Estrogen receptor 100% positive with strong staining intensity and progesterone receptor 60% positive with moderate staining intensity.   She is here today to discuss those results and make definitive treatment plans   REVIEW OF SYSTEMS: Debra Henderson reports that Dr. Marlou Starks has discussed having a lumpectomy with her. She notes that 02/06/2017 is her surgery. She reports that she is going to be taking a codeine cough medicine because she has a productive cough.  She had a shot of methylprednisolone earlier today she reports that her husband also has been going to the doctor for heart issues. She notes that she isn't exercising right now. She denies unusual headaches, visual changes, nausea,  vomiting, or dizziness. There has been no unusual cough, phlegm production, or pleurisy. This been no change in bowel or bladder habits. She denies unexplained fatigue or unexplained weight loss, bleeding, rash, or fever. A detailed review of systems was otherwise stable.   RIGHT BREAST CANCER HISTORY: From the original intake note:   Debra Henderson underwent screening mammography in December 2013. Calcifications were noted in the right breast, requiring further evaluation.  Subsequently, a stereotactic right breast biopsy was obtained on 02/13/2012 which confirmed ductal carcinoma in situ with calcifications, low grade, ER and PR positive, both at 100%. [phone])  The patient underwent a right lumpectomy under the care of Dr. Marlou Starks on 04/22/2012. (Surgery have been delayed only slightly secondary to the patient's cardiac history and the requirements of restaging of the left common iliac artery prior to surgery.) Final pathology showed a low-grade ductal carcinoma in situ with a 0.1 cm focus of disease, and close to the posterior margin at 0.1 cm.  Atypical lobular hyperplasia was also noted. PTis, pNX. [phone]).  The patient was treated with adjuvant radiation therapy under the care of Dr. Pablo Ledger, completed 08/23/2012.  Her subsequent history is as detailed below.      PAST MEDICAL HISTORY: Past Medical History:  Diagnosis Date  . Breast cancer ([place]) 02/13/12   right upper outer- DCIS, ER/PR+  . Cancer [facility]) 2014   breast cancer  . Chronic kidney disease    renal artery stenosis  . Claudication [facility])    lower extremities  . Complication of anesthesia    ' It does not work " I am difficult to put tpo sleep  . Family history of anesthesia complication    my brother is difficult to put to sleep also  . GERD (gastroesophageal reflux disease)  otc  . History of renal stent    LEA DUPLEX, 03/16/2012 - LEFT EIA DISTAL/COMMON FEMORAL ARTERY-demonstrated occlusive disease  .  Hyperlipidemia   . Hypertension   . Peripheral vascular disease ([place])    prior stenting of left iliac artery  . Personal history of radiation therapy   . Radiation 08/02/12-08/23/12   Right breast 42.72 Gy x 16 fx  . Renal artery stenosis ([facility])    RENAL DOPPLER, 02/12/2011 - RIGHT RENAL ARTERY 60-99% diameter reduction, LEFT RENAL ARTERY AT STENT 60-99% diameter reduction    PAST SURGICAL HISTORY: Past Surgical History:  Procedure Laterality Date  . ANGIOPLASTY ILLIAC ARTERY  02/24/2012   Dr Gwenlyn Found  L iliac restent  . ATHERECTOMY N/A 02/24/2012   Procedure: ATHERECTOMY;  Surgeon: Lorretta Harp, MD;  Location: [facility] CATH LAB;  Service: Cardiovascular;  Laterality: N/A;  . BLADDER SUSPENSION  1980's  . BREAST BIOPSY Right 05/2013  . BREAST BIOPSY Right 02/13/2012  . BREAST LUMPECTOMY Right 05/02/2012  . BREAST LUMPECTOMY WITH NEEDLE LOCALIZATION Right 04/22/2012   Procedure: RIGHT BREAST WIRE LOCALIZATION  LUMPECTOMY ;  Surgeon: Merrie Roof, MD;  Location: [facility];  Service: General;  Laterality: Right;  . fibroid breast     left breast-adenoma benign 1980's  . ILIAC ARTERY STENT  11/21/19/02   left   . NM MYOVIEW LTD  03/21/2010   normal myocardial perfusion study  . PERIPHERAL VASCULAR CATHETERIZATION Left 02/24/2012   Common iliac artery, 8x38 iCast Stent, resulting in a reduction from 80% in-stent restenosis to 0% residual  . PERIPHERAL VASCULAR CATHETERIZATION Left 08/04/2006   Renal 90% stenosis, 3.5x 13 drug-eluting stent resulting in a reduction of 90% in-stent restenosis to 0% residual  . PERIPHERAL VASCULAR CATHETERIZATION Left 05/22/2005   Renal 80% in-stent stenosis, 5x15 Aviator resulting in a reduction of 80% in-stent restenosis to 0% residual  . PERIPHERAL VASCULAR CATHETERIZATION Left 02/22/2002   Renal in-stent restenosis, 5x15 Guidant rapid-exchange balloon resulting in reduction of a 95% in-stent restenosis to less than 20% residual  . PERIPHERAL VASCULAR CATHETERIZATION Left  12/10/2000   95% renal stenosis, 6x105mm Genesis Aviator balloon/stent deployed at 10 atmospheres resulting in a reduction  of 95% stenosis to 0% residual; Common iliac artery stenosis, P12x4 mounted on a 7x2 Powerflex balloon resulting in a reduction of 80% to 0% residual  . PERIPHERAL VASCULAR CATHETERIZATION Left 07/27/2000   95% renal stenosis, 53mm x 6cm Smart stent resulting in a reduction of 90-95%  to 0% residual  . stent /kidney  12/10/00,08/04/06   2002 R renal artery, 2008 L renal  . TUBAL LIGATION     1980's    FAMILY HISTORY (reviewed 06/07/2013) Family History  Problem Relation Age of Onset  . Cancer Mother        colon  . COPD Mother   . Cancer Father        brain  . Stroke Sister   . Heart attack Sister   Mother died at the age of 38. Father died at the age of 43. She has one older brother who has Crohn's disease. She has a younger sister who has multiple health issues secondary to having been a premature infant.  There is no known history of breast or GYN cancer in the family.  GYNECOLOGIC HISTORY:   (Reviewed 06/07/2013) Debra Henderson P2;  she took birth control pills for approximately 2 years in her 59s with good tolerance. She went through menopause naturally, approximately age 29.  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 Her husband, Jori Moll, is retired.  Both have significant chronic medical problems.  They have 2 daughters, one who lives locally, another in [place]. She has 2 grandchildren, ages 7 months and 23 years.    ADVANCED DIRECTIVES: Not in place   HEALTH MAINTENANCE:  (Reviewed 06/07/2013) Social History   Tobacco Use  . Smoking status: Former Smoker    Packs/day: 1.00    Types: Cigarettes    Last attempt to quit: 02/24/1988    Years since quitting: 28.9  . Smokeless tobacco: Never Used  . Tobacco comment: quit 1990  Substance Use Topics  . Alcohol use: No  . Drug use: No     Colonoscopy: Not on file  PAP: Not on  file  Bone density: 2008, normal  Lipid panel: Not on file  Allergies  Allergen Reactions  . Erythromycin   . Tetracyclines & Related Nausea And Vomiting and Other (See Comments)    Vomiting blood  . Vytorin [Ezetimibe-Simvastatin]     Per Dr Adora Fridge note  . Chicken Allergy Diarrhea and Rash  . Eggs Or Egg-Derived Products Diarrhea and Rash  . Lipitor [Atorvastatin] Rash and Other (See Comments)    Myalgias   . Penicillins Swelling and Rash  . Sulfa Antibiotics Swelling and Rash    Current Outpatient Medications  Medication Sig Dispense Refill  . aspirin 81 MG tablet Take 81 mg by mouth daily.    . clopidogrel (PLAVIX) 75 MG tablet TAKE1 TABLET EVERY DAY  3  . losartan-hydrochlorothiazide (HYZAAR) 50-12.5 MG per tablet TAKE 1 TABLET BY MOUTH EVERY DAY 30 tablet 0  . omega-3 acid ethyl esters (LOVAZA) 1 G capsule Take 1 g by mouth 2 (two) times daily.    . OXYBUTYNIN CHLORIDE PO Take by mouth.    . hyaluronate sodium (RADIAPLEXRX) GEL Apply 1 application topically 2 (two) times daily. Apply after rad tx and bedtime    . rosuvastatin (CRESTOR) 10 MG tablet Take 10 mg by mouth every evening.      No current facility-administered medications for this visit.     OBJECTIVE: Middle-aged white Henderson who appears stated age  72:   01/27/17 1554  BP: 129/61  Pulse: 84  Resp: 18  Temp: 98.3 F (36.8 C)  SpO2: 100%     Body mass index is 25.86 kg/m.    ECOG FS: 1 Filed Weights   01/27/17 1554  Weight: 132 lb 6.4 oz (60.1 kg)   Sclerae unicteric, pupils round and equal Oropharynx clear and moist No cervical or supraclavicular adenopathy Lungs no rales or rhonchi Heart regular rate and rhythm Abd soft, nontender, positive bowel sounds MSK no focal spinal tenderness, no upper extremity lymphedema Neuro: nonfocal, well oriented, appropriate affect Breasts: The right breast is status post lumpectomy.  There is no evidence of local recurrence.  I do not palpate a mass in the  left breast.  There are no skin or nipple changes of concern.  Both axillae are benign.  LAB RESULTS:  CMP    Lab Results  Component Value Date   WBC 8.5 10/03/2013   NEUTROABS 5.4 10/03/2013   HGB 13.4 10/03/2013   HCT 40.2 10/03/2013   MCV 98.0 10/03/2013   PLT 277 10/03/2013      Chemistry      Component Value Date/Time   NA 138 10/03/2013 1505   K 3.2 (L) 10/03/2013 1505   CL 96 06/01/2012 1120   CL 100  05/20/2012 1454   CO2 27 10/03/2013 1505   BUN 7.2 10/03/2013 1505   CREATININE 0.8 10/03/2013 1505      Component Value Date/Time   CALCIUM 9.5 10/03/2013 1505   ALKPHOS 76 10/03/2013 1505   AST 11 10/03/2013 1505   ALT 9 10/03/2013 1505   BILITOT 0.31 10/03/2013 1505       STUDIES: Since her last visit, she underwent diagnostic bilateral mammography with CAD and tomography and left breast ultrasonography on 12/16/2016 at [place] showing: breast density category B. Indeterminate left breast multi-cystic mass. Findings likely represent fibrocystic changes. No suspicious left axillary lymphadenopathy. Stable right breast postsurgical changes without mammographic evidence of malignancy.  She the proceeded to left breast biopsy ([id number]) on 12/25/2016 showing: low grade ductal carcinoma in situ. Prognostic indicators significant for: ER, 100% positive with strong staining intensity and PR, 60% positive with moderate staining intensity.    ASSESSMENT: 72 y.o. Debra Henderson:  1)  status post  left lumpectomy April 2014 for a low-grade ductal carcinoma in situ. Focally close to the posterior margin at 0.1 cm. ER +100%, PR +100%.  2) Status post radiation therapy under the care of Dr. Pablo Ledger, completed 08/23/2012  3) decided against antiestrogens June 2015  4)  multiple comorbidities including peripheral vascular disease with stenting of the left iliac artery; hypertension; claudication; and a history of renal artery stenosis, with stenting of the left  renal artery.  5) right breast upper inner quadrant biopsy 06/17/2013 showed a complex sclerosing lesion  6) status post left breast upper outer quadrant biopsy 12/25/2016 showing ductal carcinoma in situ, low-grade, estrogen and progesterone receptor positive.  7) left lumpectomy scheduled for 02/06/2017  8) adjuvant radiation to follow  9) consider antiestrogens at the completion of local treatment.  PLAN: Since she has developed a second, thankfully noninvasive cancer, and the contralateral breast.  Because of her prior history of breast cancer she unfortunately does not meet criteria for the comet trial.  Otherwise she would be an excellent candidate for that.  Debra Henderson understands that in noninvasive ductal carcinoma, also called ductal carcinoma in situ ("DCIS") the breast cancer cells remain trapped in the ducts were they started. They cannot travel to a vital organ. For that reason these cancers in themselves are not life-threatening.  If the whole breast is removed then all the ducts are removed and since the cancer cells are trapped in the ducts, the cure rate with mastectomy for noninvasive breast cancer is approximately 99%. Nevertheless we recommend lumpectomy, because there is no survival advantage to mastectomy and because the cosmetic result is generally superior with breast conservation.  Since the patient is keeping her breasts, there will be some risk of recurrence. The recurrence can only be in the same breast since, again, the cells are trapped in the ducts. There is no connection from one breast to the other. The risk of local recurrence is cut by more than half with radiation, which is standard in this situation.  In estrogen receptor positive cancers like Debra Henderson, anti-estrogens can also be considered. They will further reduce the risk of recurrence by one half. In addition anti-estrogens will lower the risk of a new breast cancer developing in either breast, also by one  half. That risk approaches 1% per year. Anti-estrogens reduce it by 50%.  Since she has already had a second breast cancer, this should be of particular interest to her.  Despite this, Yannet's preliminary impression is that after surgery and radiation  she will not want to proceed to antiestrogens.  Just to make sure I will schedule her to see me in May of this year.  She will have completed her local therapy by then.  We will again go into the possible side effects toxicities and complications of anastrozole.  If she decides to proceed with that I will see her approximately 3 months later but if she chooses not to receive antiestrogens, she will have very close follow-up through other other physicians and so her return here will be optional.  She knows to call for any other problems that may develop before   Debra Henderson, Virgie Dad, MD  01/27/17 4:26 PM Medical Oncology and Hematology [facility] [place], [place] [address] Tel. [phone]    Fax. [phone]  This document serves as a record of services personally performed by Debra Del, MD. It was created on his behalf by Debra Henderson, a trained medical scribe. The creation of this record is based on the scribe's personal observations and the provider's statements to them.   I have reviewed the above documentation for accuracy and completeness, and I agree with the above.

## 2017-01-27 ENCOUNTER — Inpatient Hospital Stay: Payer: PPO | Attending: Oncology | Admitting: Oncology

## 2017-01-27 VITALS — BP 129/61 | HR 84 | Temp 98.3°F | Resp 18 | Ht 60.0 in | Wt 132.4 lb

## 2017-01-27 DIAGNOSIS — I739 Peripheral vascular disease, unspecified: Secondary | ICD-10-CM | POA: Insufficient documentation

## 2017-01-27 DIAGNOSIS — D0511 Intraductal carcinoma in situ of right breast: Secondary | ICD-10-CM | POA: Diagnosis not present

## 2017-01-27 DIAGNOSIS — D0512 Intraductal carcinoma in situ of left breast: Secondary | ICD-10-CM | POA: Diagnosis not present

## 2017-01-27 DIAGNOSIS — Z87891 Personal history of nicotine dependence: Secondary | ICD-10-CM | POA: Insufficient documentation

## 2017-01-27 DIAGNOSIS — J069 Acute upper respiratory infection, unspecified: Secondary | ICD-10-CM | POA: Diagnosis not present

## 2017-01-27 DIAGNOSIS — Z6825 Body mass index (BMI) 25.0-25.9, adult: Secondary | ICD-10-CM | POA: Diagnosis not present

## 2017-01-27 DIAGNOSIS — I10 Essential (primary) hypertension: Secondary | ICD-10-CM | POA: Diagnosis not present

## 2017-01-27 DIAGNOSIS — L2089 Other atopic dermatitis: Secondary | ICD-10-CM | POA: Diagnosis not present

## 2017-01-27 DIAGNOSIS — E782 Mixed hyperlipidemia: Secondary | ICD-10-CM | POA: Diagnosis not present

## 2017-01-27 DIAGNOSIS — Z1389 Encounter for screening for other disorder: Secondary | ICD-10-CM | POA: Diagnosis not present

## 2017-01-27 DIAGNOSIS — E663 Overweight: Secondary | ICD-10-CM | POA: Diagnosis not present

## 2017-01-27 DIAGNOSIS — J42 Unspecified chronic bronchitis: Secondary | ICD-10-CM | POA: Insufficient documentation

## 2017-01-27 DIAGNOSIS — J410 Simple chronic bronchitis: Secondary | ICD-10-CM

## 2017-01-29 ENCOUNTER — Telehealth: Payer: Self-pay | Admitting: Oncology

## 2017-01-29 ENCOUNTER — Encounter: Payer: Self-pay | Admitting: Nurse Practitioner

## 2017-01-29 ENCOUNTER — Ambulatory Visit (INDEPENDENT_AMBULATORY_CARE_PROVIDER_SITE_OTHER): Payer: PPO | Admitting: Nurse Practitioner

## 2017-01-29 ENCOUNTER — Telehealth: Payer: Self-pay

## 2017-01-29 VITALS — BP 118/78 | HR 56 | Ht 60.0 in | Wt 132.6 lb

## 2017-01-29 DIAGNOSIS — K625 Hemorrhage of anus and rectum: Secondary | ICD-10-CM

## 2017-01-29 DIAGNOSIS — Z800 Family history of malignant neoplasm of digestive organs: Secondary | ICD-10-CM

## 2017-01-29 DIAGNOSIS — Z1211 Encounter for screening for malignant neoplasm of colon: Secondary | ICD-10-CM

## 2017-01-29 MED ORDER — PEG-KCL-NACL-NASULF-NA ASC-C 140 G PO SOLR: 1.0000 | ORAL | 0 refills | Status: DC

## 2017-01-29 NOTE — Patient Instructions (Signed)
If you are age 70 or older, your body mass index should be between 23-30. Your Body mass index is 25.9 kg/m. If this is out of the aforementioned range listed, please consider follow up with your Primary Care Provider.  If you are age 75 or younger, your body mass index should be between 19-25. Your Body mass index is 25.9 kg/m. If this is out of the aformentioned range listed, please consider follow up with your Primary Care Provider.   You have been scheduled for a colonoscopy. Please follow written instructions given to you at your visit today.  Please pick up your prep supplies at the pharmacy within the next 1-3 days. If you use inhalers (even only as needed), please bring them with you on the day of your procedure. Your physician has requested that you go to www.startemmi.com and enter the access code given to you at your visit today. This web site gives a general overview about your procedure. However, you should still follow specific instructions given to you by our office regarding your preparation for the procedure.  We have sent the following medications to your pharmacy for you to pick up at your convenience: Plenvu  You will be contacted by our office prior to your procedure for directions on holding your Plavix.  If you do not hear from our office 1 week prior to your scheduled procedure, please call [phone] to discuss.   Thank you for choosing me and [facility].   Tye Savoy, NP

## 2017-01-29 NOTE — Telephone Encounter (Signed)
[facility] [address] [place], [place]  [address] Phone:  [phone]   Fax:  [phone]  01/29/2017   RE:      Debra Henderson DOB:   [birth year] MRN:   [id number]   Dear Dr. Gerarda Fraction,    We have scheduled the above patient for an endoscopic procedure. Our records show that she is on anticoagulation therapy.   Please advise as to how long the patient may come off her therapy of Plavix prior to the colonoscopy procedure, which is scheduled for 03/04/17.  Please fax back/ or route the completed form to [place] at [phone].   Sincerely,    Thurmon Fair, RMA

## 2017-01-29 NOTE — Progress Notes (Signed)
Chief Complaint:  Rectal bleeding  HPI: Patient is a 72 yo female with hx of breast cancer and PVD, on plavix. She had right breast carcinoma in situ 2013, status post lumpectomy and radiation. 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  She is referred by [facility],  Dr. Marlou Starks, for rectal bleeding.  Patient had a few episodes of rectal bleeding mid December and again a few days after Christmas. She drinks a lot of water and stools usually soft but she had been on meds for overactive bladder which caused some constipation with straining.  Bleeding was not assocated with any abdominal / rectal pain. Mother had colon cancer in her late 69's. Patient has never had a colonoscopy She has PVD, stents placed > year ago.  She has held Plavix before for procedures.  Patient has no other GI complaints.   Past Medical History:  Diagnosis Date  . Breast cancer ([place]) 02/13/12   right upper outer- DCIS, ER/PR+  . Cancer [facility]) 2014   breast cancer  . Chronic kidney disease    renal artery stenosis  . Claudication [facility])    lower extremities  . Complication of anesthesia    ' It does not work " I am difficult to put tpo sleep  . Family history of anesthesia complication    my brother is difficult to put to sleep also  . GERD (gastroesophageal reflux disease)    otc  . History of renal stent    LEA DUPLEX, 03/16/2012 - LEFT EIA DISTAL/COMMON FEMORAL ARTERY-demonstrated occlusive disease  . Hyperlipidemia   . Hypertension   . Peripheral vascular disease ([place])    prior stenting of left iliac artery  . Personal history of radiation therapy   . Radiation 08/02/12-08/23/12   Right breast 42.72 Gy x 16 fx  . Renal artery stenosis ([place])    RENAL DOPPLER, 02/12/2011 - RIGHT RENAL ARTERY 60-99% diameter reduction, LEFT RENAL ARTERY AT STENT 60-99% diameter reduction     Past Surgical History:  Procedure Laterality Date  . ANGIOPLASTY ILLIAC ARTERY  02/24/2012   Dr  Gwenlyn Found  L iliac restent  . ATHERECTOMY N/A 02/24/2012   Procedure: ATHERECTOMY;  Surgeon: Lorretta Harp, MD;  Location: [facility] C[facility] LAB;  Service: Cardiovascular;  Laterality: N/A;  . BLADDER SUSPENSION  1980's  . BREAST BIOPSY Right 05/2013  . BREAST BIOPSY Right 02/13/2012  . BREAST LUMPECTOMY Right 05/02/2012  . BREAST LUMPECTOMY WITH NEEDLE LOCALIZATION Right 04/22/2012   Procedure: RIGHT BREAST WIRE LOCALIZATION  LUMPECTOMY ;  Surgeon: Merrie Roof, MD;  Location: [facility];  Service: General;  Laterality: Right;  . fibroid breast     left breast-adenoma benign 1980's  . ILIAC ARTERY STENT  11/21/19/02   left   . NM MYOVIEW LTD  03/21/2010   normal myocardial perfusion study  . PERIPHERAL VASCULAR CATHETERIZATION Left 02/24/2012   Common iliac artery, 8x38 iCast Stent, resulting in a reduction from 80% in-stent restenosis to 0% residual  . PERIPHERAL VASCULAR CATHETERIZATION Left 08/04/2006   Renal 90% stenosis, 3.5x 13 drug-eluting stent resulting in a reduction of 90% in-stent restenosis to 0% residual  . PERIPHERAL VASCULAR CATHETERIZATION Left 05/22/2005   Renal 80% in-stent stenosis, 5x15 Aviator resulting in a reduction of 80% in-stent restenosis to 0% residual  . PERIPHERAL VASCULAR CATHETERIZATION Left 02/22/2002   Renal in-stent restenosis, 5x15 Guidant rapid-exchange balloon resulting in reduction of a 95% in-stent restenosis to less  than 20% residual  . PERIPHERAL VASCULAR CATHETERIZATION Left 12/10/2000   95% renal stenosis, 6x69mm Genesis Aviator balloon/stent deployed at 10 atmospheres resulting in a reduction  of 95% stenosis to 0% residual; Common iliac artery stenosis, P12x4 mounted on a 7x2 Powerflex balloon resulting in a reduction of 80% to 0% residual  . PERIPHERAL VASCULAR CATHETERIZATION Left 07/27/2000   95% renal stenosis, 52mm x 6cm Smart stent resulting in a reduction of 90-95%  to 0% residual  . stent /kidney  12/10/00,08/04/06   2002 R renal artery, 2008 L renal  . TUBAL  LIGATION     1980's   Family History  Problem Relation Age of Onset  . Cancer Mother        colon  . COPD Mother   . Cancer Father        brain  . Stroke Sister   . Heart attack Sister    Social History   Tobacco Use  . Smoking status: Former Smoker    Packs/day: 1.00    Types: Cigarettes    Last attempt to quit: 02/24/1988    Years since quitting: 28.9  . Smokeless tobacco: Never Used  . Tobacco comment: quit 1990  Substance Use Topics  . Alcohol use: No  . Drug use: No   Current Outpatient Medications  Medication Sig Dispense Refill  . albuterol (PROVENTIL HFA;VENTOLIN HFA) 108 (90 Base) MCG/ACT inhaler Inhale 2 puffs into the lungs every 6 (six) hours as needed for wheezing or shortness of breath.    Marland Kitchen albuterol (PROVENTIL) (2.5 MG/3ML) 0.083% nebulizer solution Take 2.5 mg by nebulization every 6 (six) hours as needed for wheezing or shortness of breath.    Marland Kitchen aspirin 81 MG tablet Take 81 mg by mouth daily.    . clopidogrel (PLAVIX) 75 MG tablet TAKE1 TABLET EVERY DAY  3  . cortisone (CORTONE) 25 MG tablet Take 25 mg by mouth daily. Takes 1/8    . hyaluronate sodium (RADIAPLEXRX) GEL Apply 1 application topically 2 (two) times daily. Apply after rad tx and bedtime    . LEVOFLOXACIN OP Apply to eye.    . losartan-hydrochlorothiazide (HYZAAR) 50-12.5 MG per tablet TAKE 1 TABLET BY MOUTH EVERY DAY 30 tablet 0  . omega-3 acid ethyl esters (LOVAZA) 1 G capsule Take 1 g by mouth 2 (two) times daily.    . OXYBUTYNIN CHLORIDE PO Take by mouth.    . rosuvastatin (CRESTOR) 10 MG tablet Take 10 mg by mouth every evening.      No current facility-administered medications for this visit.    Allergies  Allergen Reactions  . Erythromycin   . Tetracyclines & Related Nausea And Vomiting and Other (See Comments)    Vomiting blood  . Vytorin [Ezetimibe-Simvastatin]     Per Dr Adora Fridge note  . Chicken Allergy Diarrhea and Rash  . Eggs Or Egg-Derived Products Diarrhea and Rash  .  Lipitor [Atorvastatin] Rash and Other (See Comments)    Myalgias   . Penicillins Swelling and Rash  . Sulfa Antibiotics Swelling and Rash     Review of Systems: Positive for urinary leakage . All other systems reviewed and negative except where noted in HPI.    Physical Exam: BP 118/78   Pulse (!) 56   Ht 5' (1.524 m)   Wt 132 lb 9.6 oz (60.1 kg)   SpO2 96%   BMI 25.90 kg/m  Constitutional:  Well-developed, white female in no acute distress. Psychiatric: Normal mood and affect. Behavior  is normal. EENT: Pupils normal.  Conjunctivae are normal. No scleral icterus. Neck supple.  Cardiovascular: Normal rate, regular rhythm. No edema Pulmonary/chest: Effort normal and breath sounds normal. No wheezing, rales or rhonchi. Abdominal: Soft, nondistended. Nontender. Bowel sounds active throughout. There are no masses palpable. No hepatomegaly. Rectal: large hemorrhoidal tags.  No fissures appreciated Lymphadenopathy: No cervical adenopathy noted. Neurological: Alert and oriented to person place and time. Skin: Skin is warm and dry. No rashes noted.   ASSESSMENT AND PLAN:  70.  72 year old female with two recent episodes of painless rectal bleeding several days apart in December.  Prior to the bleeding she had struggled with constipation secondary to overbladder medication (? Anticholinergic) so bleeding possibly perianal in nature.  No fissures appreciated on her rectal exam today. -No bleeding in days but patient needs further workup.  She has never had a colonoscopy, mother had colon cancer in her 39s.  For further evaluation patient will be scheduled for colonoscopy.  The risks and benefits of the procedure were explained and the patient agrees to proceed  2.  Breast cancer in situ, status post right lumpectomy for this in 2013.  Now for left lumpectomy later this month.   Tye Savoy, NP  01/29/2017, 2:40 PM  Cc: Jovita Kussmaul, MD

## 2017-01-29 NOTE — Telephone Encounter (Signed)
Spoke with patient re May f/u

## 2017-01-29 NOTE — Telephone Encounter (Signed)
Spoke with patient re May appointment.

## 2017-01-30 NOTE — Progress Notes (Signed)
Agree with assessment and plan as outlined.  

## 2017-02-02 ENCOUNTER — Encounter (HOSPITAL_BASED_OUTPATIENT_CLINIC_OR_DEPARTMENT_OTHER): Payer: Self-pay | Admitting: *Deleted

## 2017-02-02 ENCOUNTER — Other Ambulatory Visit: Payer: Self-pay

## 2017-02-02 ENCOUNTER — Telehealth: Payer: Self-pay

## 2017-02-02 NOTE — Telephone Encounter (Signed)
Lmtcb regarding surgical clearance. Patient hasn't been seen since 2014 so we can't comment regarding Plavix.

## 2017-02-02 NOTE — Progress Notes (Addendum)
Bring all medications.Plans to go to [facility] for CMET,CBC,DIff , EKG and CXR . Pt to call and set up appointment for the above.

## 2017-02-02 NOTE — Telephone Encounter (Signed)
   Primary Cardiologist: No primary care provider on file.  Chart reviewed as part of pre-operative protocol coverage. Patient was contacted 02/02/2017 in reference to pre-operative risk assessment for pending surgery as outlined below.  Debra Henderson was last seen on 2014 by Dr. Gwenlyn Found.  Since that day, with no further records we cannot comment on her plavix.  Due to new or worsening symptoms, Debra Henderson will require a follow-up visit for further pre-operative risk assessment.  Pre-op covering staff: - Please schedule appointment and call patient to inform them. - Please contact requesting surgeon's office via preferred method (i.e, phone, fax) to inform them of need for appointment prior to surgery.  Cecilie Kicks, NP 02/02/2017, 5:03 PM

## 2017-02-03 ENCOUNTER — Telehealth: Payer: Self-pay | Admitting: Cardiovascular Disease

## 2017-02-03 ENCOUNTER — Ambulatory Visit (HOSPITAL_COMMUNITY)
Admission: RE | Admit: 2017-02-03 | Discharge: 2017-02-03 | Disposition: A | Discharge status: Home / Self Care | Payer: PPO | Source: Ambulatory Visit | Attending: General Surgery | Admitting: General Surgery

## 2017-02-03 ENCOUNTER — Encounter (HOSPITAL_COMMUNITY)
Admission: RE | Admit: 2017-02-03 | Discharge: 2017-02-03 | Disposition: A | Discharge status: Home / Self Care | Payer: PPO | Source: Ambulatory Visit | Attending: General Surgery | Admitting: General Surgery

## 2017-02-03 DIAGNOSIS — I701 Atherosclerosis of renal artery: Secondary | ICD-10-CM | POA: Diagnosis not present

## 2017-02-03 DIAGNOSIS — Z01818 Encounter for other preprocedural examination: Secondary | ICD-10-CM | POA: Insufficient documentation

## 2017-02-03 DIAGNOSIS — R05 Cough: Secondary | ICD-10-CM | POA: Diagnosis not present

## 2017-02-03 DIAGNOSIS — E782 Mixed hyperlipidemia: Secondary | ICD-10-CM | POA: Diagnosis not present

## 2017-02-03 DIAGNOSIS — D0592 Unspecified type of carcinoma in situ of left breast: Secondary | ICD-10-CM | POA: Diagnosis not present

## 2017-02-03 DIAGNOSIS — Z01812 Encounter for preprocedural laboratory examination: Secondary | ICD-10-CM | POA: Insufficient documentation

## 2017-02-03 DIAGNOSIS — E663 Overweight: Secondary | ICD-10-CM | POA: Diagnosis not present

## 2017-02-03 DIAGNOSIS — Z6825 Body mass index (BMI) 25.0-25.9, adult: Secondary | ICD-10-CM | POA: Diagnosis not present

## 2017-02-03 DIAGNOSIS — R7309 Other abnormal glucose: Secondary | ICD-10-CM | POA: Diagnosis not present

## 2017-02-03 DIAGNOSIS — Z01810 Encounter for preprocedural cardiovascular examination: Secondary | ICD-10-CM | POA: Diagnosis not present

## 2017-02-03 DIAGNOSIS — I10 Essential (primary) hypertension: Secondary | ICD-10-CM | POA: Diagnosis not present

## 2017-02-03 DIAGNOSIS — D0591 Unspecified type of carcinoma in situ of right breast: Secondary | ICD-10-CM | POA: Diagnosis not present

## 2017-02-03 DIAGNOSIS — I739 Peripheral vascular disease, unspecified: Secondary | ICD-10-CM | POA: Diagnosis not present

## 2017-02-03 DIAGNOSIS — R059 Cough, unspecified: Secondary | ICD-10-CM

## 2017-02-03 LAB — CBC WITH DIFFERENTIAL/PLATELET
Basophils Absolute: 0 10*3/uL (ref 0.0–0.1)
Basophils Relative: 0 %
Eosinophils Absolute: 0.1 10*3/uL (ref 0.0–0.7)
Eosinophils Relative: 1 %
HCT: 35.5 % — ABNORMAL LOW (ref 36.0–46.0)
Hemoglobin: 11.4 g/dL — ABNORMAL LOW (ref 12.0–15.0)
Lymphocytes Relative: 30 %
Lymphs Abs: 2.7 10*3/uL (ref 0.7–4.0)
MCH: 30.8 pg (ref 26.0–34.0)
MCHC: 32.1 g/dL (ref 30.0–36.0)
MCV: 95.9 fL (ref 78.0–100.0)
Monocytes Absolute: 0.8 10*3/uL (ref 0.1–1.0)
Monocytes Relative: 9 %
Neutro Abs: 5.4 10*3/uL (ref 1.7–7.7)
Neutrophils Relative %: 60 %
Platelets: 441 10*3/uL — ABNORMAL HIGH (ref 150–400)
RBC: 3.7 MIL/uL — ABNORMAL LOW (ref 3.87–5.11)
RDW: 13.6 % (ref 11.5–15.5)
WBC: 9.1 10*3/uL (ref 4.0–10.5)

## 2017-02-03 LAB — COMPREHENSIVE METABOLIC PANEL
ALT: 10 U/L — ABNORMAL LOW (ref 14–54)
AST: 13 U/L — ABNORMAL LOW (ref 15–41)
Albumin: 3.8 g/dL (ref 3.5–5.0)
Alkaline Phosphatase: 68 U/L (ref 38–126)
Anion gap: 12 (ref 5–15)
BUN: 15 mg/dL (ref 6–20)
CO2: 24 mmol/L (ref 22–32)
Calcium: 9.2 mg/dL (ref 8.9–10.3)
Chloride: 95 mmol/L — ABNORMAL LOW (ref 101–111)
Creatinine, Ser: 0.88 mg/dL (ref 0.44–1.00)
GFR calc Af Amer: >60 mL/min (ref >60)
GFR calc non Af Amer: >60 mL/min (ref >60)
Glucose, Bld: 127 mg/dL — ABNORMAL HIGH (ref 65–99)
Potassium: 3.2 mmol/L — ABNORMAL LOW (ref 3.5–5.1)
Sodium: 131 mmol/L — ABNORMAL LOW (ref 135–145)
Total Bilirubin: 0.4 mg/dL (ref 0.3–1.2)
Total Protein: 7.8 g/dL (ref 6.5–8.1)

## 2017-02-03 NOTE — Telephone Encounter (Signed)
Spoke with the patient. Per Dr. Kennon Holter assistant she can be placed on the schedule for tomorrow. The patient did state that she stopped her plavix on Monday if preparation for the upcoming surgery on 1/18. She now has an appointment on 1/16 with Dr. Gwenlyn Found.

## 2017-02-03 NOTE — Telephone Encounter (Signed)
Patient called regarding pre op clearance. She has been informed that she needs an appointment since she has not been seen since 2014. She stated that she did stop her Plavix on Monday in preparation for her surgery on 1/18. Per Dr. Kennon Holter assistant she can be seen at 9:15 tomorrow. The patient verbalized her understanding.

## 2017-02-03 NOTE — Telephone Encounter (Signed)
New message ° °Pt verbalized that she is returning call for RN °

## 2017-02-04 ENCOUNTER — Inpatient Hospital Stay (HOSPITAL_COMMUNITY): Admission: RE | Admit: 2017-02-04 | Payer: PPO | Source: Ambulatory Visit

## 2017-02-04 ENCOUNTER — Encounter: Payer: Self-pay | Admitting: Cardiovascular Disease

## 2017-02-04 ENCOUNTER — Ambulatory Visit: Payer: PPO | Admitting: Cardiovascular Disease

## 2017-02-04 ENCOUNTER — Encounter: Payer: Self-pay | Admitting: Radiation Oncology

## 2017-02-04 DIAGNOSIS — E782 Mixed hyperlipidemia: Secondary | ICD-10-CM | POA: Insufficient documentation

## 2017-02-04 DIAGNOSIS — I10 Essential (primary) hypertension: Secondary | ICD-10-CM | POA: Diagnosis not present

## 2017-02-04 DIAGNOSIS — E7800 Pure hypercholesterolemia, unspecified: Secondary | ICD-10-CM | POA: Diagnosis not present

## 2017-02-04 DIAGNOSIS — I739 Peripheral vascular disease, unspecified: Secondary | ICD-10-CM | POA: Diagnosis not present

## 2017-02-04 DIAGNOSIS — E785 Hyperlipidemia, unspecified: Secondary | ICD-10-CM | POA: Insufficient documentation

## 2017-02-04 NOTE — Assessment & Plan Note (Signed)
History of essential hypertension blood pressure measured 130/70. She is on losartan and hydrochlorothiazide. Continue current meds at current dosing.

## 2017-02-04 NOTE — Progress Notes (Signed)
02/04/2017 Debra Henderson   1945-08-24  [id number]  Primary Physician [name], MD Primary Cardiologist: Lorretta Harp MD Debra Henderson, [place]  HPI:  Debra Henderson is a 72 y.o.  mildly overweight married Caucasian female mother of 2, grandmother and 3 grandchildren who is retired from doing office work. Caucasian female who I last saw in the office 06/24/12. She has a history of normal coronaries by catheterization performed by Dr. Gwenlyn Found February 2004. She does have peripheral artery disease and he stented her left renal artery and left iliac artery also in 2004. In 2007 she was restudied and had documented in-stent restenosis within that left renal artery stent which was then subsequently restented. Her history also includes discontinued tobacco abuse, hypertension and hyperlipidemia. The patient had symptoms of worsening lower extremity claudication in 2013. At that time, she had lower extremity arterial Dopplers with right ABI of 0.78 and left of 0.82 and high-frequency signals in both iliac arteries left greater than right. The patient underwent PV angiogram which revealed 80% proximal in-stent restenosis within the left common iliac artery. She underwent successful PTA and stenting by Dr. Gwenlyn Found with an iCAST covered stent with excellent results. She was continued on Plavix and discharged on February 25, 2012. Also of note, the patient has breast cancer and is scheduled to undergo a lumpectomy at some point in the future.followup Dopplers suggested occlusion of the left iliac system. She was re\re angiogram 06/01/12 revealed an occluded left external iliac and common femoral artery. She currently denies claudication. Since I saw her 5 years ago she's done well. She has stopped smoking. She is being seen today for cardiac vessel clearance before scheduled left breast lumpectomy for breast cancer. She's had lumpectomy on the right in the past. She has stopped Plavix earlier this week.  She denies chest pain or shortness of breath.   Current Meds  Medication Sig  . albuterol (PROVENTIL HFA;VENTOLIN HFA) 108 (90 Base) MCG/ACT inhaler Inhale 2 puffs into the lungs every 6 (six) hours as needed for wheezing or shortness of breath.  . Albuterol Sulfate (PROAIR HFA IN) Inhale into the lungs.  Marland Kitchen aspirin 81 MG tablet Take 81 mg by mouth daily.  . clopidogrel (PLAVIX) 75 MG tablet TAKE1 TABLET EVERY DAY  . cortisone (CORTONE) 25 MG tablet Take 25 mg by mouth daily. Takes 1/8  . hyaluronate sodium (RADIAPLEXRX) GEL Apply 1 application topically 2 (two) times daily. Apply after rad tx and bedtime  . HYDROcodone-homatropine (HYCODAN) 5-1.5 MG/5ML syrup Take 5 mLs by mouth every 6 (six) hours as needed for cough.  Marland Kitchen LEVOFLOXACIN PO Take by mouth.  . losartan-hydrochlorothiazide (HYZAAR) 50-12.5 MG per tablet TAKE 1 TABLET BY MOUTH EVERY DAY  . omega-3 acid ethyl esters (LOVAZA) 1 G capsule Take 1 g by mouth 2 (two) times daily.  . OXYBUTYNIN CHLORIDE PO Take by mouth.  Marland Kitchen PEG-KCl-NaCl-NaSulf-Na Asc-C (PLENVU) 140 g SOLR Take 1 kit by mouth as directed.     Allergies  Allergen Reactions  . Tetracyclines & Related Nausea And Vomiting and Other (See Comments)    Vomiting blood  . Vytorin [Ezetimibe-Simvastatin] Other (See Comments)    Per Dr Adora Fridge note  . Chicken Allergy Diarrhea and Rash  . Eggs Or Egg-Derived Products Diarrhea and Rash  . Erythromycin Rash  . Lipitor [Atorvastatin] Rash and Other (See Comments)    Myalgias   . Penicillins Swelling and Rash  . Sulfa Antibiotics Swelling and Rash  Social History   Socioeconomic History  . Marital status: Married    Spouse name: Not on file  . Number of children: Not on file  . Years of education: Not on file  . Highest education level: Not on file  Social Needs  . Financial resource strain: Not on file  . Food insecurity - worry: Not on file  . Food insecurity - inability: Not on file  . Transportation needs -  medical: Not on file  . Transportation needs - non-medical: Not on file  Occupational History  . Not on file  Tobacco Use  . Smoking status: Former Smoker    Packs/day: 1.00    Types: Cigarettes    Last attempt to quit: 02/24/1988    Years since quitting: 28.9  . Smokeless tobacco: Never Used  . Tobacco comment: quit 1990  Substance and Sexual Activity  . Alcohol use: No  . Drug use: No  . Sexual activity: No    Birth control/protection: Post-menopausal    Comment: menarche age 5, P26, menopause 68, no HRT  Other Topics Concern  . Not on file  Social History Narrative  . Not on file     Review of Systems: General: negative for chills, fever, night sweats or weight changes.  Cardiovascular: negative for chest pain, dyspnea on exertion, edema, orthopnea, palpitations, paroxysmal nocturnal dyspnea or shortness of breath Dermatological: negative for rash Respiratory: negative for cough or wheezing Urologic: negative for hematuria Abdominal: negative for nausea, vomiting, diarrhea, bright red blood per rectum, melena, or hematemesis Neurologic: negative for visual changes, syncope, or dizziness All other systems reviewed and are otherwise negative except as noted above.    Blood pressure 130/70, pulse 96, height 5' (1.524 m), weight 130 lb (59 kg).  General appearance: alert and no distress Neck: no adenopathy, no carotid bruit, no JVD, supple, symmetrical, trachea midline and thyroid not enlarged, symmetric, no tenderness/mass/nodules Lungs: clear to auscultation bilaterally Heart: regular rate and rhythm, S1, S2 normal, no murmur, click, rub or gallop Extremities: extremities normal, atraumatic, no cyanosis or edema Pulses: 2+ and symmetric Skin: Skin color, texture, turgor normal. No rashes or lesions Neurologic: Mental status: Alert, oriented, thought content appropriate  EKG not performed today.  ASSESSMENT AND PLAN:   PAD (peripheral artery disease) History of  peripheral arterial disease status post left renal artery stenting back in 2004 and restenting because of in-stent restenosis. She's also had left iliac stenting by myself the angiography performed 06/01/12 revealing occluded left iliac and 40% right common and external iliac artery stenosis. She does have somewhat tolerating claudication but does not wish to pursue this at this time.  Essential hypertension History of essential hypertension blood pressure measured 130/70. She is on losartan and hydrochlorothiazide. Continue current meds at current dosing.  Hyperlipidemia History of hyperlipidemia not on statin therapy followed by her PCP.      Lorretta Harp MD FACP,FACC,FAHA, [facility] 02/04/2017 9:41 AM

## 2017-02-04 NOTE — Assessment & Plan Note (Signed)
History of peripheral arterial disease status post left renal artery stenting back in 2004 and restenting because of in-stent restenosis. She's also had left iliac stenting by myself the angiography performed 06/01/12 revealing occluded left iliac and 40% right common and external iliac artery stenosis. She does have somewhat tolerating claudication but does not wish to pursue this at this time.

## 2017-02-04 NOTE — Patient Instructions (Signed)
Medication Instructions: Your physician recommends that you continue on your current medications as directed. Please refer to the Current Medication list given to you today.  OK to stop Plavix   Follow-Up: Your physician recommends that you schedule a follow-up appointment as needed with Dr. Gwenlyn Found.  You have been cleared for surgery. I have sent a clearance letter to Dr. Ethlyn Gallery office.

## 2017-02-04 NOTE — Progress Notes (Signed)
Pt given Ensure Pre surgery drink with instruction to drink by [place], she voiced understanding.

## 2017-02-04 NOTE — Assessment & Plan Note (Signed)
History of hyperlipidemia not on statin therapy followed by her PCP 

## 2017-02-05 ENCOUNTER — Ambulatory Visit
Admission: RE | Admit: 2017-02-05 | Discharge: 2017-02-05 | Disposition: A | Discharge status: Home / Self Care | Payer: PPO | Source: Ambulatory Visit | Attending: General Surgery | Admitting: General Surgery

## 2017-02-05 DIAGNOSIS — D0512 Intraductal carcinoma in situ of left breast: Secondary | ICD-10-CM | POA: Diagnosis not present

## 2017-02-05 NOTE — Progress Notes (Signed)
Labs reviewed by Dr. Jillyn Hidden, will proceed with surgery as scheduled.

## 2017-02-06 ENCOUNTER — Ambulatory Visit (HOSPITAL_BASED_OUTPATIENT_CLINIC_OR_DEPARTMENT_OTHER)
Admission: RE | Admit: 2017-02-06 | Discharge: 2017-02-06 | Disposition: A | Discharge status: Home / Self Care | Payer: PPO | Source: Ambulatory Visit | Attending: General Surgery | Admitting: General Surgery

## 2017-02-06 ENCOUNTER — Ambulatory Visit (HOSPITAL_BASED_OUTPATIENT_CLINIC_OR_DEPARTMENT_OTHER): Payer: PPO | Admitting: Certified Registered"

## 2017-02-06 ENCOUNTER — Other Ambulatory Visit: Payer: Self-pay

## 2017-02-06 ENCOUNTER — Encounter (HOSPITAL_BASED_OUTPATIENT_CLINIC_OR_DEPARTMENT_OTHER): Payer: Self-pay | Admitting: Certified Registered"

## 2017-02-06 ENCOUNTER — Ambulatory Visit
Admission: RE | Admit: 2017-02-06 | Discharge: 2017-02-06 | Disposition: A | Discharge status: Home / Self Care | Payer: PPO | Source: Ambulatory Visit | Attending: General Surgery | Admitting: General Surgery

## 2017-02-06 ENCOUNTER — Encounter (HOSPITAL_BASED_OUTPATIENT_CLINIC_OR_DEPARTMENT_OTHER)
Admission: RE | Disposition: A | Discharge status: Home / Self Care | Payer: Self-pay | Source: Ambulatory Visit | Attending: General Surgery

## 2017-02-06 DIAGNOSIS — Z7982 Long term (current) use of aspirin: Secondary | ICD-10-CM | POA: Diagnosis not present

## 2017-02-06 DIAGNOSIS — Z79899 Other long term (current) drug therapy: Secondary | ICD-10-CM | POA: Insufficient documentation

## 2017-02-06 DIAGNOSIS — Z923 Personal history of irradiation: Secondary | ICD-10-CM | POA: Insufficient documentation

## 2017-02-06 DIAGNOSIS — Z881 Allergy status to other antibiotic agents status: Secondary | ICD-10-CM | POA: Insufficient documentation

## 2017-02-06 DIAGNOSIS — R928 Other abnormal and inconclusive findings on diagnostic imaging of breast: Secondary | ICD-10-CM | POA: Diagnosis not present

## 2017-02-06 DIAGNOSIS — N6022 Fibroadenosis of left breast: Secondary | ICD-10-CM | POA: Insufficient documentation

## 2017-02-06 DIAGNOSIS — D0512 Intraductal carcinoma in situ of left breast: Secondary | ICD-10-CM | POA: Diagnosis not present

## 2017-02-06 DIAGNOSIS — D0511 Intraductal carcinoma in situ of right breast: Secondary | ICD-10-CM | POA: Diagnosis not present

## 2017-02-06 DIAGNOSIS — I10 Essential (primary) hypertension: Secondary | ICD-10-CM | POA: Diagnosis not present

## 2017-02-06 DIAGNOSIS — E785 Hyperlipidemia, unspecified: Secondary | ICD-10-CM | POA: Diagnosis not present

## 2017-02-06 HISTORY — DX: Unspecified asthma, uncomplicated: J45.909

## 2017-02-06 HISTORY — PX: BREAST LUMPECTOMY: SHX2

## 2017-02-06 HISTORY — DX: Unspecified urinary incontinence: R32

## 2017-02-06 HISTORY — PX: BREAST LUMPECTOMY WITH RADIOACTIVE SEED LOCALIZATION: SHX6424

## 2017-02-06 SURGERY — BREAST LUMPECTOMY WITH RADIOACTIVE SEED LOCALIZATION
Anesthesia: General | Site: Breast | Laterality: Left

## 2017-02-06 MED ORDER — ALBUTEROL SULFATE (2.5 MG/3ML) 0.083% IN NEBU
INHALATION_SOLUTION | RESPIRATORY_TRACT | Status: AC
  Filled 2017-02-06: qty 3

## 2017-02-06 MED ORDER — BUPIVACAINE HCL (PF) 0.25 % IJ SOLN
INTRAMUSCULAR | Status: AC
  Filled 2017-02-06: qty 30

## 2017-02-06 MED ORDER — GABAPENTIN 300 MG PO CAPS
ORAL_CAPSULE | ORAL | Status: AC
  Filled 2017-02-06: qty 1

## 2017-02-06 MED ORDER — FENTANYL CITRATE (PF) 100 MCG/2ML IJ SOLN
INTRAMUSCULAR | Status: AC
  Filled 2017-02-06: qty 2

## 2017-02-06 MED ORDER — CHLORHEXIDINE GLUCONATE CLOTH 2 % EX PADS: 6.0000 | MEDICATED_PAD | Freq: Once | CUTANEOUS | Status: DC

## 2017-02-06 MED ORDER — EPHEDRINE SULFATE 50 MG/ML IJ SOLN
INTRAMUSCULAR | Status: DC | PRN
  Administered 2017-02-06 (×2): 10 mg via INTRAVENOUS

## 2017-02-06 MED ORDER — FENTANYL CITRATE (PF) 100 MCG/2ML IJ SOLN
50.0000 ug | INTRAMUSCULAR | Status: DC | PRN
  Administered 2017-02-06: 100 ug via INTRAVENOUS
  Administered 2017-02-06: 25 ug via INTRAVENOUS

## 2017-02-06 MED ORDER — PROPOFOL 10 MG/ML IV BOLUS
INTRAVENOUS | Status: DC | PRN
  Administered 2017-02-06: 150 mg via INTRAVENOUS

## 2017-02-06 MED ORDER — SCOPOLAMINE 1 MG/3DAYS TD PT72: 1.0000 | MEDICATED_PATCH | Freq: Once | TRANSDERMAL | Status: DC | PRN

## 2017-02-06 MED ORDER — VANCOMYCIN HCL IN DEXTROSE 1-5 GM/200ML-% IV SOLN
1000.0000 mg | INTRAVENOUS | Status: AC
  Administered 2017-02-06: 1000 mg via INTRAVENOUS

## 2017-02-06 MED ORDER — GABAPENTIN 300 MG PO CAPS
300.0000 mg | ORAL_CAPSULE | ORAL | Status: AC
  Administered 2017-02-06: 300 mg via ORAL

## 2017-02-06 MED ORDER — MIDAZOLAM HCL 2 MG/2ML IJ SOLN: 1.0000 mg | INTRAMUSCULAR | Status: DC | PRN

## 2017-02-06 MED ORDER — VANCOMYCIN HCL IN DEXTROSE 1-5 GM/200ML-% IV SOLN
INTRAVENOUS | Status: AC
  Filled 2017-02-06: qty 200

## 2017-02-06 MED ORDER — ONDANSETRON HCL 4 MG/2ML IJ SOLN
INTRAMUSCULAR | Status: DC | PRN
  Administered 2017-02-06: 4 mg via INTRAVENOUS

## 2017-02-06 MED ORDER — ACETAMINOPHEN 500 MG PO TABS
ORAL_TABLET | ORAL | Status: AC
  Filled 2017-02-06: qty 2

## 2017-02-06 MED ORDER — IPRATROPIUM-ALBUTEROL 0.5-2.5 (3) MG/3ML IN SOLN
3.0000 mL | RESPIRATORY_TRACT | Status: DC
  Filled 2017-02-06: qty 3

## 2017-02-06 MED ORDER — LACTATED RINGERS IV SOLN
INTRAVENOUS | Status: DC
  Administered 2017-02-06: 10:00:00 via INTRAVENOUS

## 2017-02-06 MED ORDER — BUPIVACAINE-EPINEPHRINE (PF) 0.5% -1:200000 IJ SOLN
INTRAMUSCULAR | Status: AC
  Filled 2017-02-06: qty 30

## 2017-02-06 MED ORDER — ACETAMINOPHEN 500 MG PO TABS
1000.0000 mg | ORAL_TABLET | ORAL | Status: AC
  Administered 2017-02-06: 1000 mg via ORAL

## 2017-02-06 MED ORDER — FENTANYL CITRATE (PF) 100 MCG/2ML IJ SOLN: 25.0000 ug | INTRAMUSCULAR | Status: DC | PRN

## 2017-02-06 MED ORDER — ONDANSETRON HCL 4 MG/2ML IJ SOLN: 4.0000 mg | Freq: Once | INTRAMUSCULAR | Status: DC | PRN

## 2017-02-06 MED ORDER — HYDROCODONE-ACETAMINOPHEN 5-325 MG PO TABS: 1.0000 | ORAL_TABLET | Freq: Four times a day (QID) | ORAL | 0 refills | Status: DC | PRN

## 2017-02-06 MED ORDER — LIDOCAINE HCL (CARDIAC) 20 MG/ML IV SOLN
INTRAVENOUS | Status: DC | PRN
  Administered 2017-02-06: 80 mg via INTRAVENOUS

## 2017-02-06 MED ORDER — BUPIVACAINE-EPINEPHRINE 0.5% -1:200000 IJ SOLN
INTRAMUSCULAR | Status: DC | PRN
  Administered 2017-02-06: 20 mL

## 2017-02-06 MED ORDER — DEXAMETHASONE SODIUM PHOSPHATE 4 MG/ML IJ SOLN
INTRAMUSCULAR | Status: DC | PRN
  Administered 2017-02-06: 10 mg via INTRAVENOUS

## 2017-02-06 SURGICAL SUPPLY — 45 items
ADH SKN CLS APL DERMABOND .7 (GAUZE/BANDAGES/DRESSINGS) ×1
APPLIER CLIP 9.375 MED OPEN (MISCELLANEOUS) ×2
APR CLP MED 9.3 20 MLT OPN (MISCELLANEOUS) ×1
BLADE SURG 15 STRL LF DISP TIS (BLADE) ×1 IMPLANT
BLADE SURG 15 STRL SS (BLADE) ×2
CANISTER SUC SOCK COL 7IN (MISCELLANEOUS) ×2 IMPLANT
CANISTER SUCT 1200ML W/VALVE (MISCELLANEOUS) ×2 IMPLANT
CHLORAPREP W/TINT 26ML (MISCELLANEOUS) ×2 IMPLANT
CLIP APPLIE 9.375 MED OPEN (MISCELLANEOUS) IMPLANT
COVER BACK TABLE 60X90IN (DRAPES) ×2 IMPLANT
COVER MAYO STAND STRL (DRAPES) ×2 IMPLANT
COVER PROBE W GEL 5X96 (DRAPES) ×2 IMPLANT
DECANTER SPIKE VIAL GLASS SM (MISCELLANEOUS) IMPLANT
DERMABOND ADVANCED (GAUZE/BANDAGES/DRESSINGS) ×1
DERMABOND ADVANCED .7 DNX12 (GAUZE/BANDAGES/DRESSINGS) ×1 IMPLANT
DEVICE DUBIN W/COMP PLATE 8390 (MISCELLANEOUS) ×2 IMPLANT
DRAPE LAPAROSCOPIC ABDOMINAL (DRAPES) ×2 IMPLANT
DRAPE UTILITY XL STRL (DRAPES) ×2 IMPLANT
ELECT COATED BLADE 2.86 ST (ELECTRODE) ×2 IMPLANT
ELECT REM PT RETURN 9FT ADLT (ELECTROSURGICAL) ×2
ELECTRODE REM PT RTRN 9FT ADLT (ELECTROSURGICAL) ×1 IMPLANT
GLOVE BIO SURGEON STRL SZ 6.5 (GLOVE) ×1 IMPLANT
GLOVE BIO SURGEON STRL SZ7.5 (GLOVE) ×4 IMPLANT
GLOVE BIOGEL PI IND STRL 7.0 (GLOVE) IMPLANT
GLOVE BIOGEL PI INDICATOR 7.0 (GLOVE) ×2
GOWN STRL REUS W/ TWL LRG LVL3 (GOWN DISPOSABLE) ×2 IMPLANT
GOWN STRL REUS W/TWL LRG LVL3 (GOWN DISPOSABLE) ×6
ILLUMINATOR WAVEGUIDE N/F (MISCELLANEOUS) IMPLANT
KIT MARKER MARGIN INK (KITS) ×2 IMPLANT
LIGHT WAVEGUIDE WIDE FLAT (MISCELLANEOUS) IMPLANT
NDL HYPO 25X1 1.5 SAFETY (NEEDLE) IMPLANT
NEEDLE HYPO 25X1 1.5 SAFETY (NEEDLE) ×2 IMPLANT
NS IRRIG 1000ML POUR BTL (IV SOLUTION) ×1 IMPLANT
PACK BASIN DAY SURGERY FS (CUSTOM PROCEDURE TRAY) ×2 IMPLANT
PENCIL BUTTON HOLSTER BLD 10FT (ELECTRODE) ×2 IMPLANT
SLEEVE SCD COMPRESS KNEE MED (MISCELLANEOUS) ×2 IMPLANT
SPONGE LAP 18X18 X RAY DECT (DISPOSABLE) ×2 IMPLANT
SUT MON AB 4-0 PC3 18 (SUTURE) ×1 IMPLANT
SUT SILK 2 0 SH (SUTURE) IMPLANT
SUT VICRYL 3-0 CR8 SH (SUTURE) ×2 IMPLANT
SYR CONTROL 10ML LL (SYRINGE) ×1 IMPLANT
TOWEL OR 17X24 6PK STRL BLUE (TOWEL DISPOSABLE) ×2 IMPLANT
TOWEL OR NON WOVEN STRL DISP B (DISPOSABLE) ×2 IMPLANT
TUBE CONNECTING 20X1/4 (TUBING) ×1 IMPLANT
YANKAUER SUCT BULB TIP NO VENT (SUCTIONS) IMPLANT

## 2017-02-06 NOTE — Transfer of Care (Signed)
Immediate Anesthesia Transfer of Care Note  Patient: Debra Henderson  Procedure(s) Performed: BREAST LUMPECTOMY WITH RADIOACTIVE SEED LOCALIZATION (Left Breast)  Patient Location: PACU  Anesthesia Type:General  Level of Consciousness: awake and patient cooperative  Airway & Oxygen Therapy: Patient Spontanous Breathing and Patient connected to face mask oxygen  Post-op Assessment: Report given to RN and Post -op Vital signs reviewed and stable  Post vital signs: Reviewed and stable  Last Vitals:  Vitals:   02/06/17 1022  BP: 120/73  Pulse: 72  Resp: 18  Temp: 36.9 C  SpO2: 99%    Last Pain:  Vitals:   02/06/17 1022  TempSrc: Oral         Complications: No apparent anesthesia complications

## 2017-02-06 NOTE — Discharge Instructions (Signed)
May Take Next Dose of Tylenol or Norco/Vicodin at 6:00pm if needed.   Post Anesthesia Home Care Instructions  Activity: Get plenty of rest for the remainder of the day. A responsible individual must stay with you for 24 hours following the procedure.  For the next 24 hours, DO NOT: -Drive a car -Paediatric nurse -Drink alcoholic beverages -Take any medication unless instructed by your physician -Make any legal decisions or sign important papers.  Meals: Start with liquid foods such as gelatin or soup. Progress to regular foods as tolerated. Avoid greasy, spicy, heavy foods. If nausea and/or vomiting occur, drink only clear liquids until the nausea and/or vomiting subsides. Call your physician if vomiting continues.  Special Instructions/Symptoms: Your throat may feel dry or sore from the anesthesia or the breathing tube placed in your throat during surgery. If this causes discomfort, gargle with warm salt water. The discomfort should disappear within 24 hours.  If you had a scopolamine patch placed behind your ear for the management of post- operative nausea and/or vomiting:  1. The medication in the patch is effective for 72 hours, after which it should be removed.  Wrap patch in a tissue and discard in the trash. Wash hands thoroughly with soap and water. 2. You may remove the patch earlier than 72 hours if you experience unpleasant side effects which may include dry mouth, dizziness or visual disturbances. 3. Avoid touching the patch. Wash your hands with soap and water after contact with the patch.

## 2017-02-06 NOTE — Op Note (Signed)
02/06/2017  11:32 AM  PATIENT:  Debra Henderson  72 y.o. female  PRE-OPERATIVE DIAGNOSIS:  LEFT BREAST DUCTAL CARCINOMA IN SITU  POST-OPERATIVE DIAGNOSIS:  LEFT BREAST DUCTAL CARCINOMA IN SITU  PROCEDURE:  Procedure(s): LEFT BREAST LUMPECTOMY WITH RADIOACTIVE SEED LOCALIZATION (Left)  SURGEON:  Surgeon(s) and Role:    * Jovita Kussmaul, MD - Primary  PHYSICIAN ASSISTANT:   ASSISTANTS: none   ANESTHESIA:   local and general  EBL:  minimal   BLOOD ADMINISTERED:none  DRAINS: none   LOCAL MEDICATIONS USED:  MARCAINE     SPECIMEN:  Source of Specimen:  left breast tissue  DISPOSITION OF SPECIMEN:  PATHOLOGY  COUNTS:  YES  TOURNIQUET:  * No tourniquets in log *  DICTATION: .Dragon Dictation   After informed consent was obtained the patient was brought to the operating room and placed in the supine position on the operating table.  After adequate induction of general anesthesia the patient's left breast was prepped with ChloraPrep, allowed to dry, and draped in usual sterile manner.  An appropriate timeout was performed.  Previously an I-125 seed was placed in the outer aspect of the left breast to mark an area of ductal carcinoma in situ.  The neoprobe was set to I-125 in the area of radioactivity was readily identified.  A vertically oriented elliptical incision was made overlying the area of radioactivity with a 15 blade knife.  The incision was carried through the skin and subcutaneous tissue sharply with electrocautery.  A column of breast tissue from skin the chest wall was removed sharply with the electrocautery while checking the area of radioactivity frequently with the neoprobe.  Once the specimen was removed it was oriented with the appropriate paint colors.  A specimen radiograph was obtained that showed the clip and seed to be near the center of the specimen.  The specimen was then sent to pathology for further evaluation.  The wound was irrigated with saline and  infiltrated with more quarter percent Marcaine.  No other abnormalities were palpable in the lumpectomy cavity.  Hemostasis was achieved using the Bovie electrocautery.  The cavity was marked with clips.  The deep layer of the wound was then closed with layers of interrupted 3-0 Vicryl stitches.  The skin was closed with a running 4-0 Monocryl subcuticular stitch.  Dermabond dressings were applied.  The patient tolerated the procedure well.  At the end of the case all needle sponge and instrument counts were correct.  The patient was then awakened and taken to recovery in stable condition.  PLAN OF CARE: Discharge to home after PACU  PATIENT DISPOSITION:  PACU - hemodynamically stable.   Delay start of Pharmacological VTE agent (>24hrs) due to surgical blood loss or risk of bleeding: not applicable

## 2017-02-06 NOTE — Interval H&P Note (Signed)
History and Physical Interval Note:  02/06/2017 10:12 AM  Laurin Coder  has presented today for surgery, with the diagnosis of LEFT BREAST DCIS  The various methods of treatment have been discussed with the patient and family. After consideration of risks, benefits and other options for treatment, the patient has consented to  Procedure(s): BREAST LUMPECTOMY WITH RADIOACTIVE SEED LOCALIZATION (Left) as a surgical intervention .  The patient's history has been reviewed, patient examined, no change in status, stable for surgery.  I have reviewed the patient's chart and labs.  Questions were answered to the patient's satisfaction.     TOTH III,PAUL S

## 2017-02-06 NOTE — H&P (Signed)
Debra Henderson  Location: [facility] Patient #: [id number] DOB: 12-Sep-1945 Married / Language: English / Race: White Female   History of Present Illness The patient is a 72 year old female who presents with breast cancer. We are asked to see the patient in consultation by Dr. Riley Kill to evaluate her for a new left breast cancer. The patient is a 72 year old white female who had a 6 month follow-up mammogram for some cysts in the upper outer left breast. On this imaging there was some change in the area in the upper outer left breast. It now measures 2.1 cm. 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 She also reports a fairly significant cough as well as blood in her stool for the last few months. Her weight is been stable. 4 years ago she was treated with radiation but declined anti-estrogens. She is now agreeable to all treatments.   Allergies  Sulfa Antibiotics  Vytorin *ANTIHYPERLIPIDEMICS*  Tetracycline *CHEMICALS*  Lipitor *ANTIHYPERLIPIDEMICS*  Penicillamine *ASSORTED CLASSES*  Erythromycin Stearate *MACROLIDES*  Allergies Reconciled   Medication History  Losartan Potassium-HCTZ (50-12.5MG  Tablet, Oral) Active. Omega-3-acid Ethyl Esters (1GM Capsule, Oral) Active. Clopidogrel Bisulfate (75MG  Tablet, Oral) Active. Crestor (10MG  Tablet, Oral) Active. Aspirin EC (81MG  Tablet DR, Oral) Active. Medications Reconciled    Review of Systems General Not Present- Appetite Loss, Chills, Fatigue, Fever, Night Sweats, Weight Gain and Weight Loss. Note: All other systems negative (unless as noted in HPI & included Review of Systems) Skin Not Present- Change in Wart/Mole, Dryness, Hives, Jaundice, New Lesions, Non-Healing Wounds, Rash and Ulcer. HEENT Not Present- Earache, Hearing Loss, Hoarseness, Nose Bleed, Oral Ulcers, Ringing in the  Ears, Seasonal Allergies, Sinus Pain, Sore Throat, Visual Disturbances, Wears glasses/contact lenses and Yellow Eyes. Respiratory Present- Chronic Cough and Dyspnea. Not Present- Bloody sputum, Difficulty Breathing, Snoring and Wheezing. Breast Not Present- Breast Mass, Breast Pain, Nipple Discharge and Skin Changes. Cardiovascular Not Present- Chest Pain, Difficulty Breathing Lying Down, Leg Cramps, Palpitations, Rapid Heart Rate, Shortness of Breath and Swelling of Extremities. Gastrointestinal Present- Bloody Stool. Not Present- Abdominal Pain, Bloating, Change in Bowel Habits, Chronic diarrhea, Constipation, Difficulty Swallowing, Excessive gas, Gets full quickly at meals, Hemorrhoids, Indigestion, Nausea, Rectal Pain and Vomiting. Female Genitourinary Not Present- Frequency, Nocturia, Painful Urination, Pelvic Pain and Urgency. Musculoskeletal Not Present- Back Pain, Joint Pain, Joint Stiffness, Muscle Pain, Muscle Weakness and Swelling of Extremities. Neurological Not Present- Decreased Memory, Fainting, Headaches, Numbness, Seizures, Tingling, Tremor, Trouble walking and Weakness. Psychiatric Not Present- Anxiety, Bipolar, Change in Sleep Pattern, Depression, Fearful and Frequent crying. Endocrine Not Present- Cold Intolerance, Excessive Hunger, Hair Changes, Heat Intolerance, Hot flashes and New Diabetes. Hematology Not Present- Easy Bruising, Excessive bleeding, Gland problems, HIV and Persistent Infections.  Vitals  Weight: 131.5 lb Height: 60in Body Surface Area: 1.56 m Body Mass Index: 25.68 kg/m  Temp.: 98.37F  Pulse: 104 (Regular)  BP: 140/85 (Sitting, Left Arm, Standard)       Physical Exam General Mental Status-Alert. General Appearance-Consistent with stated age. Hydration-Well hydrated. Voice-Normal.  Head and Neck Head-normocephalic, atraumatic with no lesions or palpable masses. Trachea-midline. Thyroid Gland Characteristics - normal  size and consistency.  Eye Eyeball - Bilateral-Extraocular movements intact. Sclera/Conjunctiva - Bilateral-No scleral icterus.  Chest and Lung Exam Chest and lung exam reveals -quiet, even and easy respiratory effort with no use of accessory  muscles and on auscultation, normal breath sounds, no adventitious sounds and normal vocal resonance. Inspection Chest Wall - Normal. Back - normal.  Breast Note: There is a well-healed scar in the upper right breast. There is no significant palpable mass in either breast. There is no palpable axillary, supraclavicular, or cervical lymphadenopathy.   Cardiovascular Cardiovascular examination reveals -normal heart sounds, regular rate and rhythm with no murmurs and normal pedal pulses bilaterally.  Abdomen Inspection Inspection of the abdomen reveals - No Hernias. Skin - Scar - no surgical scars. Palpation/Percussion Palpation and Percussion of the abdomen reveal - Soft, Non Tender, No Rebound tenderness, No Rigidity (guarding) and No hepatosplenomegaly. Auscultation Auscultation of the abdomen reveals - Bowel sounds normal.  Neurologic Neurologic evaluation reveals -alert and oriented x 3 with no impairment of recent or remote memory. Mental Status-Normal.  Musculoskeletal Normal Exam - Left-Upper Extremity Strength Normal and Lower Extremity Strength Normal. Normal Exam - Right-Upper Extremity Strength Normal and Lower Extremity Strength Normal.  Lymphatic Head & Neck  General Head & Neck Lymphatics: Bilateral - Description - Normal. Axillary  General Axillary Region: Bilateral - Description - Normal. Tenderness - Non Tender. Femoral & Inguinal  Generalized Femoral & Inguinal Lymphatics: Bilateral - Description - Normal. Tenderness - Non Tender.    Assessment & Plan  DUCTAL CARCINOMA IN SITU (DCIS) OF LEFT BREAST (D05.12) Impression: The patient appears to have a 2 cm area of ductal carcinoma in situ in the  upper outer left breast. I have talked her about the different options for treatment and at this point she favors breast conservation. I think this is a reasonable way of treating her cancer. She will also be a good candidate for radiation therapy and anti-estrogen therapy. She is agreeable to all of this. I will go ahead and refer to medical and radiation oncology. I will plan for a left breast radioactive seed localized lumpectomy. She will not need a note evaluation since this area is fairly small. Since she has been also passing blood in her stool I will also refer her to gastroenterology for colonoscopy Current Plans Referred to Oncology, for evaluation and follow up (Oncology). Routine. BLOOD IN STOOL (K92.1) Current Plans Referred to Gastroenterology, for evaluation and follow up Physiological scientist). Routine.

## 2017-02-06 NOTE — Anesthesia Procedure Notes (Signed)
Procedure Name: LMA Insertion Date/Time: 02/06/2017 10:43 AM Performed by: Signe Colt, CRNA Pre-anesthesia Checklist: Patient identified, Emergency Drugs available, Suction available and Patient being monitored Patient Re-evaluated:Patient Re-evaluated prior to induction Oxygen Delivery Method: Circle system utilized Preoxygenation: Pre-oxygenation with 100% oxygen Induction Type: IV induction Ventilation: Mask ventilation without difficulty LMA: LMA inserted LMA Size: 4.0 Number of attempts: 1 Airway Equipment and Method: Bite block Placement Confirmation: positive ETCO2 Tube secured with: Tape Dental Injury: Teeth and Oropharynx as per pre-operative assessment

## 2017-02-06 NOTE — Anesthesia Postprocedure Evaluation (Signed)
Anesthesia Post Note  Patient: Debra Henderson  Procedure(s) Performed: BREAST LUMPECTOMY WITH RADIOACTIVE SEED LOCALIZATION (Left Breast)     Patient location during evaluation: PACU Anesthesia Type: General Level of consciousness: awake and alert Pain management: pain level controlled Vital Signs Assessment: post-procedure vital signs reviewed and stable Respiratory status: spontaneous breathing, nonlabored ventilation, respiratory function stable and patient connected to nasal cannula oxygen Cardiovascular status: blood pressure returned to baseline and stable Postop Assessment: no apparent nausea or vomiting Anesthetic complications: no    Last Vitals:  Vitals:   02/06/17 1305 02/06/17 1310  BP:    Pulse: 87 86  Resp: 17 (!) 24  Temp:    SpO2: 94% 93%    Last Pain:  Vitals:   02/06/17 1300  TempSrc:   PainSc: 0-No pain                 Mahnoor Mathisen P Jadalyn Oliveri

## 2017-02-06 NOTE — Anesthesia Preprocedure Evaluation (Addendum)
Anesthesia Evaluation  Patient identified by MRN, date of birth, ID band Patient awake    Reviewed: Allergy & Precautions, H&P , NPO status , Patient's Chart, lab work & pertinent test results  History of Anesthesia Complications (+) Family history of anesthesia reaction  Airway Mallampati: I  TM Distance: >3 FB Neck ROM: Full    Dental  (+) Teeth Intact, Missing, Dental Advisory Given   Pulmonary asthma , former smoker,    breath sounds clear to auscultation       Cardiovascular hypertension, Pt. on medications + Peripheral Vascular Disease   Rhythm:Regular Rate:Normal  ECG: NSR, rate 76   Neuro/Psych    GI/Hepatic   Endo/Other    Renal/GU Renal disease     Musculoskeletal   Abdominal   Peds  Hematology  (+) anemia , HLD   Anesthesia Other Findings LEFT BREAST DCIS  Reproductive/Obstetrics                            Anesthesia Physical  Anesthesia Plan  ASA: III  Anesthesia Plan: General   Post-op Pain Management:    Induction: Intravenous  PONV Risk Score and Plan: 3 and Ondansetron, Dexamethasone, Midazolam and Treatment may vary due to age or medical condition  Airway Management Planned: LMA  Additional Equipment:   Intra-op Plan:   Post-operative Plan: Extubation in OR  Informed Consent: I have reviewed the patients History and Physical, chart, labs and discussed the procedure including the risks, benefits and alternatives for the proposed anesthesia with the patient or authorized representative who has indicated his/her understanding and acceptance.     Plan Discussed with: CRNA  Anesthesia Plan Comments:        Anesthesia Quick Evaluation

## 2017-02-06 NOTE — Anesthesia Procedure Notes (Signed)
Procedure Name: LMA Insertion Date/Time: 02/06/2017 10:43 AM Performed by: Signe Colt, CRNA Pre-anesthesia Checklist: Patient identified, Emergency Drugs available, Suction available and Patient being monitored Patient Re-evaluated:Patient Re-evaluated prior to induction Oxygen Delivery Method: Circle system utilized Preoxygenation: Pre-oxygenation with 100% oxygen Induction Type: IV induction Ventilation: Mask ventilation without difficulty LMA: LMA inserted LMA Size: 3.0 Number of attempts: 1 Airway Equipment and Method: Bite block Placement Confirmation: positive ETCO2 Tube secured with: Tape Dental Injury: Teeth and Oropharynx as per pre-operative assessment

## 2017-02-09 ENCOUNTER — Encounter (HOSPITAL_BASED_OUTPATIENT_CLINIC_OR_DEPARTMENT_OTHER): Payer: Self-pay | Admitting: General Surgery

## 2017-02-12 ENCOUNTER — Telehealth: Payer: Self-pay

## 2017-02-12 NOTE — Telephone Encounter (Signed)
Pt advised to HOLD Plavix 3 days prior to procedure and to restart the day after procedure per Dr. Gerarda Fraction. Patient verbalized understanding.  Anticoagulation letter with written instructions per Dr. Gerarda Fraction scanned to chart.

## 2017-02-12 NOTE — Telephone Encounter (Signed)
-----   Message from Lowell Guitar, [place] sent at 02/11/2017  4:17 PM EST ----- Regarding: colonoscopy - Plavix clearance Check on clearance from Dr. Gerarda Fraction

## 2017-02-19 ENCOUNTER — Encounter: Payer: Self-pay | Admitting: Gastroenterology

## 2017-02-23 ENCOUNTER — Ambulatory Visit: Payer: Medicare Other

## 2017-02-23 ENCOUNTER — Ambulatory Visit: Payer: Medicare Other | Admitting: Radiation Oncology

## 2017-03-04 ENCOUNTER — Other Ambulatory Visit: Payer: Self-pay

## 2017-03-04 ENCOUNTER — Ambulatory Visit (AMBULATORY_SURGERY_CENTER): Payer: PPO | Admitting: Gastroenterology

## 2017-03-04 ENCOUNTER — Encounter: Payer: Self-pay | Admitting: Gastroenterology

## 2017-03-04 VITALS — BP 122/68 | HR 87 | Temp 98.9°F | Resp 18 | Ht 60.0 in | Wt 130.0 lb

## 2017-03-04 DIAGNOSIS — D123 Benign neoplasm of transverse colon: Secondary | ICD-10-CM

## 2017-03-04 DIAGNOSIS — K639 Disease of intestine, unspecified: Secondary | ICD-10-CM | POA: Diagnosis not present

## 2017-03-04 DIAGNOSIS — D120 Benign neoplasm of cecum: Secondary | ICD-10-CM

## 2017-03-04 DIAGNOSIS — K625 Hemorrhage of anus and rectum: Secondary | ICD-10-CM

## 2017-03-04 DIAGNOSIS — K635 Polyp of colon: Secondary | ICD-10-CM

## 2017-03-04 DIAGNOSIS — D125 Benign neoplasm of sigmoid colon: Secondary | ICD-10-CM

## 2017-03-04 MED ORDER — SODIUM CHLORIDE 0.9 % IV SOLN: 500.0000 mL | Freq: Once | INTRAVENOUS | Status: DC

## 2017-03-04 NOTE — Progress Notes (Signed)
Pt's states no medical or surgical changes since previsit or office visit. 

## 2017-03-04 NOTE — Progress Notes (Signed)
A/ox3 pleased with MAC, report to Donna RN 

## 2017-03-04 NOTE — Op Note (Signed)
[place] Patient Name: Debra Henderson Procedure Date: 03/04/2017 2:13 PM MRN: [id number] Endoscopist: Remo Lipps P. Sinclaire Artiga MD, MD Age: 72 Referring MD:  Date of Birth: 1945-09-16 Gender: Female Account #: [id number] Procedure:                Colonoscopy Indications:              This is the patient's first colonoscopy,                            Hematochezia Medicines:                Monitored Anesthesia Care Procedure:                Pre-Anesthesia Assessment:                           - Prior to the procedure, a History and Physical                            was performed, and patient medications and                            allergies were reviewed. The patient's tolerance of                            previous anesthesia was also reviewed. The risks                            and benefits of the procedure and the sedation                            options and risks were discussed with the patient.                            All questions were answered, and informed consent                            was obtained. Prior Anticoagulants: The patient has                            taken Plavix (clopidogrel), last dose was 5 days                            prior to procedure. ASA Grade Assessment: II - A                            patient with mild systemic disease. After reviewing                            the risks and benefits, the patient was deemed in                            satisfactory condition to undergo the procedure.  After obtaining informed consent, the colonoscope                            was passed under direct vision. Throughout the                            procedure, the patient's blood pressure, pulse, and                            oxygen saturations were monitored continuously. The                            Model PCF-[id number]) scope was introduced                            through the anus and advanced to  the the cecum,                            identified by appendiceal orifice and ileocecal                            valve. The colonoscopy was technically difficult                            and complex due to a tortuous colon. The patient                            tolerated the procedure well. The quality of the                            bowel preparation was adequate. The ileocecal                            valve, appendiceal orifice, and rectum were                            photographed. Scope In: 2:18:52 PM Scope Out: 2:51:44 PM Scope Withdrawal Time: 0 hours 21 minutes 4 seconds  Total Procedure Duration: 0 hours 32 minutes 52 seconds  Findings:                 The perianal and digital rectal examinations were                            normal.                           Scattered medium-mouthed diverticula were found in                            the sigmoid colon and ascending colon.                           A 5 mm polyp was found in the cecum. The polyp was  sessile. The polyp was removed with a cold snare.                            Resection and retrieval were complete.                           A single small angiodysplastic lesion was found in                            the ascending colon.                           Three sessile polyps were found in the transverse                            colon. The polyps were 4 to 10 mm in size. These                            polyps were removed with a cold snare. Resection                            and retrieval were complete.                           A 8 mm polyp was found in the sigmoid colon. The                            polyp was sessile. The polyp was removed with a                            cold snare. Resection and retrieval were complete.                           An area of mucosa was found in the sigmoid colon -                            protuberant slightly inflamed adjacent to a                             diverticulum. Suspect inflammatory changes related                            to diverticulosis but biopsies were taken with a                            cold forceps for histology to ensure no adenomatous                            change.                           Internal hemorrhoids were found during  retroflexion. The hemorrhoids were large.                           The colon was tortuous which prolonged the                            procedure. the patient did not retained air well in                            the left colon.                           The exam was otherwise without abnormality. Complications:            No immediate complications. Estimated blood loss:                            Minimal. Estimated Blood Loss:     Estimated blood loss was minimal. Impression:               - Diverticulosis in the sigmoid colon and in the                            ascending colon.                           - One 5 mm polyp in the cecum, removed with a cold                            snare. Resected and retrieved.                           - A single colonic angiodysplastic lesion.                           - Three 4 to 10 mm polyps in the transverse colon,                            removed with a cold snare. Resected and retrieved.                           - One 8 mm polyp in the sigmoid colon, removed with                            a cold snare. Resected and retrieved.                           - Mucosa in the sigmoid colon. Biopsied as above.                           - Internal hemorrhoids.                           - Tortuous colon.                           -  The examination was otherwise normal. Recommendation:           - Patient has a contact number available for                            emergencies. The signs and symptoms of potential                            delayed complications were discussed with the                             patient. Return to normal activities tomorrow.                            Written discharge instructions were provided to the                            patient.                           - Resume previous diet.                           - Continue present medications.                           - Resume Plavix in 48 hours                           - Await pathology results.                           - Repeat colonoscopy is recommended for                            surveillance. The colonoscopy date will be                            determined after pathology results from today's                            exam become available for review.                           - No ibuprofen, naproxen, or other non-steroidal                            anti-inflammatory drugs for 2 weeks after polyp                            removal. Remo Lipps P. Charizma Gardiner MD, MD 03/04/2017 3:00:12 PM This report has been signed electronically.

## 2017-03-04 NOTE — Progress Notes (Signed)
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 SEE CANCER TABLE. - NO INVASIVE MALIGNANCY IDENTIFIED. - FOCAL INVOLVEMENT OF ADENOSIS BY DCIS. 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  12/25/16 Diagnosis Breast, left, needle core biopsy, 2 o'clock, 5 cm fn - LOW GRADE DUCTAL CARCINOMA IN SITU.  Receptor Status: ER(100%), PR (60%), Her2-neu (), Ki-()  Did patient present with symptoms (if so, please note symptoms) or was this found on screening mammography?: screening mammogram  Past/Anticipated interventions by surgeon, if any: 02/06/17 - Procedure: BREAST LUMPECTOMY WITH RADIOACTIVE SEED LOCALIZATION;  Surgeon: Jovita Kussmaul, MD;  Location: [place]  Past/Anticipated interventions by medical oncology, if any: consider antiestrogens at the completion of local treatment.  Lymphedema issues, if any:      Pain issues, if any:     SAFETY ISSUES:  Prior radiation? 08/02/12-08/23/12  Right breast 42.72 Gy x 16 fx  Pacemaker/ICD?   Possible current pregnancy?no  Is the patient on methotrexate?   Current Complaints / other details:      Jacqulyn Liner, RN 03/04/2017,8:50 AM

## 2017-03-04 NOTE — Progress Notes (Signed)
Patient had a lumpectomy on l breast one month ago.  CRNA Osvaldo Angst approved the patient for her procedure today.

## 2017-03-04 NOTE — Progress Notes (Signed)
Called to room to assist during endoscopic procedure.  Patient ID and intended procedure confirmed with present staff. Received instructions for my participation in the procedure from the performing physician.  

## 2017-03-04 NOTE — Patient Instructions (Addendum)
Resume Plavix in 48 hours.       YOU HAD AN ENDOSCOPIC PROCEDURE TODAY AT [facility]:   Refer to the procedure report that was given to you for any specific questions about what was found during the examination.  If the procedure report does not answer your questions, please call your gastroenterologist to clarify.  If you requested that your care partner not be given the details of your procedure findings, then the procedure report has been included in a sealed envelope for you to review at your convenience later.  YOU SHOULD EXPECT: Some feelings of bloating in the abdomen. Passage of more gas than usual.  Walking can help get rid of the air that was put into your GI tract during the procedure and reduce the bloating. If you had a lower endoscopy (such as a colonoscopy or flexible sigmoidoscopy) you may notice spotting of blood in your stool or on the toilet paper. If you underwent a bowel prep for your procedure, you may not have a normal bowel movement for a few days.  Please Note:  You might notice some irritation and congestion in your nose or some drainage.  This is from the oxygen used during your procedure.  There is no need for concern and it should clear up in a day or so.  SYMPTOMS TO REPORT IMMEDIATELY:   Following lower endoscopy (colonoscopy or flexible sigmoidoscopy):  Excessive amounts of blood in the stool  Significant tenderness or worsening of abdominal pains  Swelling of the abdomen that is new, acute  Fever of 100F or higher  Please see handouts on polyps, diverticulosis, and hemorrhoids.  For urgent or emergent issues, a gastroenterologist can be reached at any hour by calling [phone].   DIET:  We do recommend a small meal at first, but then you may proceed to your regular diet.  Drink plenty of fluids but you should avoid alcoholic beverages for 24 hours.  ACTIVITY:  You should plan to take it easy for the rest of today and you should NOT  DRIVE or use heavy machinery until tomorrow (because of the sedation medicines used during the test).    FOLLOW UP: Our staff will call the number listed on your records the next business day following your procedure to check on you and address any questions or concerns that you may have regarding the information given to you following your procedure. If we do not reach you, we will leave a message.  However, if you are feeling well and you are not experiencing any problems, there is no need to return our call.  We will assume that you have returned to your regular daily activities without incident.  If any biopsies were taken you will be contacted by phone or by letter within the next 1-3 weeks.  Please call us at [phone] if you have not heard about the biopsies in 3 weeks.    SIGNATURES/CONFIDENTIALITY: You and/or your care partner have signed paperwork which will be entered into your electronic medical record.  These signatures attest to the fact that that the information above on your After Visit Summary has been reviewed and is understood.  Full responsibility of the confidentiality of this discharge information lies with you and/or your care-partner.   Thank you for allowing [place] to provide your healthcare today.

## 2017-03-05 ENCOUNTER — Telehealth: Payer: Self-pay | Admitting: *Deleted

## 2017-03-05 ENCOUNTER — Telehealth: Payer: Self-pay

## 2017-03-05 NOTE — Telephone Encounter (Signed)
  Follow up Call-  Call back number 03/04/2017 03/04/2017  Post procedure Call Back phone  # [phone] -  Permission to leave phone message Yes Yes  Some recent data might be hidden    [facility]

## 2017-03-05 NOTE — Telephone Encounter (Signed)
  Follow up Call-  Call back number 03/04/2017 03/04/2017  Post procedure Call Back phone  # [phone] -  Permission to leave phone message Yes Yes  Some recent data might be hidden     Patient questions:  Do you have a fever, pain , or abdominal swelling? No. Pain Score  0 *  Have you tolerated food without any problems? Yes.    Have you been able to return to your normal activities? Yes.    Do you have any questions about your discharge instructions: Diet   No. Medications  No. Follow up visit  No.  Do you have questions or concerns about your Care? No.  Actions: * If pain score is 4 or above: No action needed, pain <4.

## 2017-03-09 ENCOUNTER — Ambulatory Visit: Payer: Medicare Other | Admitting: Radiation Oncology

## 2017-03-09 ENCOUNTER — Encounter: Payer: Self-pay | Admitting: Gastroenterology

## 2017-03-11 ENCOUNTER — Ambulatory Visit
Admission: RE | Admit: 2017-03-11 | Discharge: 2017-03-11 | Disposition: A | Discharge status: Home / Self Care | Payer: PPO | Source: Ambulatory Visit | Attending: Radiation Oncology | Admitting: Radiation Oncology

## 2017-03-11 ENCOUNTER — Ambulatory Visit: Payer: PPO

## 2017-03-24 NOTE — Progress Notes (Signed)
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 SEE CANCER TABLE. - NO INVASIVE MALIGNANCY IDENTIFIED. - FOCAL INVOLVEMENT OF ADENOSIS BY DCIS. 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  12/25/16 Diagnosis Breast, left, needle core biopsy, 2 o'clock, 5 cm fn - LOW GRADE DUCTAL CARCINOMA IN SITU.  Receptor Status: ER(100%), PR (60%), Her2-neu (), Ki-()  Did patient present with symptoms (if so, please note symptoms) or was this found on screening mammography?: screening mammogram  Past/Anticipated interventions by surgeon, if any: 02/06/17 - Procedure: BREAST LUMPECTOMY WITH RADIOACTIVE SEED LOCALIZATION;  Surgeon: Jovita Kussmaul, MD;  Location: [place]  Past/Anticipated interventions by medical oncology, if any: consider antiestrogens at the completion of local treatment.  Lymphedema issues, if any:  no   Pain issues, if any:  She reports having occasional sharp pains in her left breast.  SAFETY ISSUES:  Prior radiation? 08/02/12-08/23/12  Right breast 42.72 Gy x 16 fx  Pacemaker/ICD? no  Possible current pregnancy?no  Is the patient on methotrexate? no  Current Complaints / other details:    BP 122/70 (BP Location: Right Arm, Patient Position: Sitting)   Pulse 80   Temp 98.4 F (36.9 C) (Oral)   Ht 5' (1.524 m)   Wt 131 lb 9.6 oz (59.7 kg)   SpO2 99%   BMI 25.70 kg/m    Wt Readings from Last 3 Encounters:  03/25/17 131 lb 9.6 oz (59.7 kg)  03/04/17 130 lb (59 kg)  02/06/17 131 lb 2 oz (59.5 kg)      Jacqulyn Liner, RN 03/24/2017,9:07 AM

## 2017-03-25 ENCOUNTER — Ambulatory Visit
Admission: RE | Admit: 2017-03-25 | Discharge: 2017-03-25 | Disposition: A | Discharge status: Home / Self Care | Payer: PPO | Source: Ambulatory Visit | Attending: Radiation Oncology | Admitting: Radiation Oncology

## 2017-03-25 ENCOUNTER — Other Ambulatory Visit: Payer: Self-pay

## 2017-03-25 ENCOUNTER — Encounter: Payer: Self-pay | Admitting: Radiation Oncology

## 2017-03-25 VITALS — BP 122/70 | HR 80 | Temp 98.4°F | Ht 60.0 in | Wt 131.6 lb

## 2017-03-25 DIAGNOSIS — Z881 Allergy status to other antibiotic agents status: Secondary | ICD-10-CM | POA: Insufficient documentation

## 2017-03-25 DIAGNOSIS — Z923 Personal history of irradiation: Secondary | ICD-10-CM | POA: Insufficient documentation

## 2017-03-25 DIAGNOSIS — Z170 Estrogen receptor positive status [ER+]: Secondary | ICD-10-CM | POA: Diagnosis not present

## 2017-03-25 DIAGNOSIS — Z87891 Personal history of nicotine dependence: Secondary | ICD-10-CM | POA: Diagnosis not present

## 2017-03-25 DIAGNOSIS — Z7982 Long term (current) use of aspirin: Secondary | ICD-10-CM | POA: Insufficient documentation

## 2017-03-25 DIAGNOSIS — D0511 Intraductal carcinoma in situ of right breast: Secondary | ICD-10-CM

## 2017-03-25 DIAGNOSIS — Z86000 Personal history of in-situ neoplasm of breast: Secondary | ICD-10-CM | POA: Diagnosis not present

## 2017-03-25 DIAGNOSIS — Z7902 Long term (current) use of antithrombotics/antiplatelets: Secondary | ICD-10-CM | POA: Insufficient documentation

## 2017-03-25 DIAGNOSIS — Z9889 Other specified postprocedural states: Secondary | ICD-10-CM | POA: Diagnosis not present

## 2017-03-25 DIAGNOSIS — Z79899 Other long term (current) drug therapy: Secondary | ICD-10-CM | POA: Diagnosis not present

## 2017-03-25 DIAGNOSIS — Z510 Encounter for antineoplastic radiation therapy: Secondary | ICD-10-CM | POA: Insufficient documentation

## 2017-03-25 DIAGNOSIS — Z882 Allergy status to sulfonamides status: Secondary | ICD-10-CM | POA: Diagnosis not present

## 2017-03-25 DIAGNOSIS — D0512 Intraductal carcinoma in situ of left breast: Secondary | ICD-10-CM | POA: Diagnosis not present

## 2017-03-25 DIAGNOSIS — Z880 Allergy status to penicillin: Secondary | ICD-10-CM | POA: Insufficient documentation

## 2017-03-25 NOTE — Progress Notes (Signed)
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 female who presented for diagnostic mammogram and ultrasound on 12/16/2016. These imaging showed an indeterminate left breast multi-cystic mass measuring 2.1 x 2.1 x 1.2 cm (previously 1.8 x 1.2 x 1.1 cm) at the 2 o'clock position 5 cm from the nipple. No suspicious left axillary lymphadenopathy. Stable right breast postsurgical changes without mammographic evidence of malignancy. Given the interval increase in size and density of the left breast mass, an ultrasound-guided biopsy was recommended.  Biopsy of the left breast mass revealed low grade ductal carcinoma in situ, ER 100%, PR 60%. The patient underwent left breast lumpectomy on 02/06/2017 with Dr. Marlou Starks. Final pathology showed DCIS, low grade. No invasive malignancy identified. Focal involvement of adenosis by DCIS. Benign breast changes consisting of adenosis with calcifications, sclerosing adenosis with calcifications, complex sclerosing lesion, intraductal papilloma, and an old fibroadenoma.    On review of systems, the patient reports that she is doing well overall. She denies any lymphedema issues. She denies any issue with arm range of movement. She reports having occasional sharp pains in her left breast. States she has seen Dr. Marlou Starks since surgery and was told she was healing well. States she only experienced fatigue with previous radiation treatment. She denies headaches. She denies any new back pain. She denies any family history of breast or ovarian cancer. A complete review of systems is obtained and is otherwise negative.  The patient is here today for  further evaluation and discussion of potential radiation treatment options.  PREVIOUS RADIATION THERAPY: Yes; 08/02/2012-08/23/2012: Right breast 42.72@2 .67 Gy per fraction x 16 fractions, Opposed tangents with reduced fields / 6 MV photons, for DCIS of the right breast with Dr. Thea Silversmith.   PAST MEDICAL HISTORY:  has a past medical history of Asthma, Breast cancer ([place]) (02/13/12), Cancer ([place]) (2014), Chronic kidney disease, Claudication ([place]), Complication of anesthesia, Family history of anesthesia complication, GERD (gastroesophageal reflux disease), History of renal stent, Hyperlipidemia, Hypertension, Incontinence, Peripheral vascular disease ([place]), Personal history of radiation therapy, Radiation (08/02/12-08/23/12), and Renal artery stenosis ([place]).    PAST SURGICAL HISTORY: Past Surgical History:  Procedure Laterality Date  . ANGIOPLASTY ILLIAC ARTERY  02/24/2012   Dr Gwenlyn Found  L iliac restent  . ATHERECTOMY N/A 02/24/2012   Procedure: ATHERECTOMY;  Surgeon: Lorretta Harp, MD;  Location: [facility] CATH LAB;  Service: Cardiovascular;  Laterality: N/A;  . BLADDER SUSPENSION  1980's  . bladder tack    . BREAST BIOPSY Right 05/2013  . BREAST BIOPSY Right 02/13/2012  . BREAST LUMPECTOMY Right 05/02/2012  . BREAST LUMPECTOMY WITH NEEDLE LOCALIZATION Right 04/22/2012   Procedure: RIGHT BREAST WIRE LOCALIZATION  LUMPECTOMY ;  Surgeon: Merrie Roof, MD;  Location: [place];  Service: General;  Laterality: Right;  . BREAST LUMPECTOMY WITH RADIOACTIVE SEED LOCALIZATION Left 02/06/2017   Procedure: BREAST LUMPECTOMY WITH RADIOACTIVE SEED LOCALIZATION;  Surgeon: Jovita Kussmaul, MD;  Location: [place];  Service: General;  Laterality: Left;  . fibroid breast     left breast-adenoma benign 1980's  . ILIAC ARTERY STENT  11/21/19/02   left   . NM MYOVIEW  LTD  03/21/2010   normal myocardial perfusion study  . PERIPHERAL VASCULAR CATHETERIZATION Left 02/24/2012   Common iliac artery, 8x38 iCast  Stent, resulting in a reduction from 80% in-stent restenosis to 0% residual  . PERIPHERAL VASCULAR CATHETERIZATION Left 08/04/2006   Renal 90% stenosis, 3.5x 13 drug-eluting stent resulting in a reduction of 90% in-stent restenosis to 0% residual  . PERIPHERAL VASCULAR CATHETERIZATION Left 05/22/2005   Renal 80% in-stent stenosis, 5x15 Aviator resulting in a reduction of 80% in-stent restenosis to 0% residual  . PERIPHERAL VASCULAR CATHETERIZATION Left 02/22/2002   Renal in-stent restenosis, 5x15 Guidant rapid-exchange balloon resulting in reduction of a 95% in-stent restenosis to less than 20% residual  . PERIPHERAL VASCULAR CATHETERIZATION Left 12/10/2000   95% renal stenosis, 6x41mm Genesis Aviator balloon/stent deployed at 10 atmospheres resulting in a reduction  of 95% stenosis to 0% residual; Common iliac artery stenosis, P12x4 mounted on a 7x2 Powerflex balloon resulting in a reduction of 80% to 0% residual  . PERIPHERAL VASCULAR CATHETERIZATION Left 07/27/2000   95% renal stenosis, 40mm x 6cm Smart stent resulting in a reduction of 90-95%  to 0% residual  . stent /kidney  12/10/00,08/04/06   2002 R renal artery, 2008 L renal  . TUBAL LIGATION     1980's    FAMILY HISTORY: family history includes COPD in her mother; Cancer in her father and mother; Heart attack in her sister; Stroke in her sister.  SOCIAL HISTORY:  reports that she quit smoking about 29 years ago. Her smoking use included cigarettes. She smoked 1.00 pack per day. she has never used smokeless tobacco. She reports that she does not drink alcohol or use drugs.  ALLERGIES: Tetracyclines & related; Vytorin [ezetimibe-simvastatin]; Chicken allergy; Eggs or egg-derived products; Erythromycin; Lipitor [atorvastatin]; Penicillins; and Sulfa antibiotics  MEDICATIONS:  Current Outpatient Medications  Medication Sig Dispense Refill  . Albuterol Sulfate (PROAIR HFA IN) Inhale into the lungs.    Marland Kitchen aspirin 81 MG tablet Take 81 mg by mouth  daily.    . clopidogrel (PLAVIX) 75 MG tablet TAKE1 TABLET EVERY DAY  3  . losartan-hydrochlorothiazide (HYZAAR) 50-12.5 MG per tablet TAKE 1 TABLET BY MOUTH EVERY DAY 30 tablet 0  . omega-3 acid ethyl esters (LOVAZA) 1 G capsule Take 1 g by mouth 2 (two) times daily.    . OXYBUTYNIN CHLORIDE PO Take by mouth.     Current Facility-Administered Medications  Medication Dose Route Frequency Provider Last Rate Last Dose  . 0.9 %  sodium chloride infusion  500 mL Intravenous Once Armbruster, Carlota Raspberry, MD        REVIEW OF SYSTEMS: REVIEW OF SYSTEMS: A 10+ POINT REVIEW OF SYSTEMS WAS OBTAINED including neurology, dermatology, psychiatry, cardiac, respiratory, lymph, extremities, GI, GU, musculoskeletal, constitutional, reproductive, HEENT. All pertinent positives are noted in the HPI. All others are negative.   PHYSICAL EXAM:  height is 5' (1.524 m) and weight is 131 lb 9.6 oz (59.7 kg). Her oral temperature is 98.4 F (36.9 C). Her blood pressure is 122/70 and her pulse is 80. Her oxygen saturation is 99%.   General: Alert and oriented, in no acute distress HEENT: Head is normocephalic. Extraocular movements are intact. Oropharynx is clear. Neck: Neck is supple, no palpable cervical or supraclavicular lymphadenopathy. Heart: Regular in rate and rhythm with no murmurs, rubs, or gallops. Chest: Clear to auscultation bilaterally, with no rhonchi, wheezes, or rales. Abdomen: Soft, nontender, nondistended, with no rigidity or guarding. Extremities: No cyanosis or edema. Lymphatics: see  Neck Exam Skin: No concerning lesions. Musculoskeletal: symmetric strength and muscle tone throughout. Neurologic: Cranial nerves II through XII are grossly intact. No obvious focalities. Speech is fluent. Coordination is intact. Psychiatric: Judgment and insight are intact. Affect is appropriate. Breast: Right breast shows no palpable mass, nipple discharge or bleeding. There is a faint scar present in the upper  aspect of the breast. There are tattoos present from the previous radiation therapy. Left breast has a scar in the upper outer quadrant that is healing well. Mild to moderate swelling noted in the left breast but no signs of infection. No nipple discharge or bleeding.   ECOG = 0  0 - Asymptomatic (Fully active, able to carry on all predisease activities without restriction)  1 - Symptomatic but completely ambulatory (Restricted in physically strenuous activity but ambulatory and able to carry out work of a light or sedentary nature. For example, light housework, office work)  2 - Symptomatic, <50% in bed during the day (Ambulatory and capable of all self care but unable to carry out any work activities. Up and about more than 50% of waking hours)  3 - Symptomatic, >50% in bed, but not bedbound (Capable of only limited self-care, confined to bed or chair 50% or more of waking hours)  4 - Bedbound (Completely disabled. Cannot carry on any self-care. Totally confined to bed or chair)  5 - Death   Eustace Pen MM, Creech RH, Tormey DC, et al. [phone]). "Toxicity and response criteria of the [facility]". [place] Oncol. 5 (6): 649-55  LABORATORY DATA:  Lab Results  Component Value Date   WBC 9.1 02/03/2017   HGB 11.4 (L) 02/03/2017   HCT 35.5 (L) 02/03/2017   MCV 95.9 02/03/2017   PLT 441 (H) 02/03/2017   NEUTROABS 5.4 02/03/2017   Lab Results  Component Value Date   NA 131 (L) 02/03/2017   K 3.2 (L) 02/03/2017   CL 95 (L) 02/03/2017   CO2 24 02/03/2017   GLUCOSE 127 (H) 02/03/2017   CREATININE 0.88 02/03/2017   CALCIUM 9.2 02/03/2017      RADIOGRAPHY: No results found.    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 Patient  however wishes to be aggressive with her management since this is her second breast cancer and wishes to proceed with radiation therapy. She does seem receptive to hormonal therapy at this time. She has an appointment scheduled to meet and discuss this with medical oncologist Dr. Jana Hakim on 06/01/17.  Today, I talked to the patient about the findings and work-up thus far.  We discussed the natural history of breast cancer and general treatment, highlighting the role of radiotherapy in the management.  We discussed the available radiation techniques, and focused on the details of logistics and delivery.  We reviewed the anticipated acute and late sequelae associated with radiation in this setting. She does understand that we may not be able to do hypofractionated therapy due to previous treatment and potential overlap over the sternum. The patient was encouraged to ask questions that I answered to the best of my ability. The patient would like to proceed with radiation and will be scheduled for CT simulation.  PLAN: The patient will proceed with CT  simulation today and will begin radiation therapy next week. I anticipate between 4 and 6 weeks of radiation therapy.        ------------------------------------------------  Blair Promise, PhD, MD  This document serves as a record of services personally performed by Gery Pray, MD. It was created on his behalf by Arlyce Harman, a trained medical scribe. The creation of this record is based on the scribe's personal observations and the provider's statements to them. This document has been checked and approved by the attending provider.

## 2017-03-26 NOTE — Progress Notes (Signed)
  Radiation Oncology         [phone] ________________________________  Name: Debra Henderson MRN: [id number]  Date: 03/25/2017  DOB: 04-Jan-1946  SIMULATION AND TREATMENT PLANNING NOTE    ICD-10-CM   1. Ductal carcinoma in situ (DCIS) of right breast D05.11   2. 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  Identity was confirmed.  All relevant records and images related to the planned course of therapy were reviewed.  The patient freely provided informed written consent to proceed with treatment after reviewing the details related to the planned course of therapy. The consent form was witnessed and verified by the simulation staff.  Then, the patient was set-up in a stable reproducible  supine position for radiation therapy.  CT images were obtained.  Surface markings were placed.  The CT images were loaded into the planning software.  Then the target and avoidance structures were contoured.  Treatment planning then occurred.  The radiation prescription was entered and confirmed.  Then, I designed and supervised the construction of a total of 3 medically necessary complex treatment devices.  I have requested : 3D Simulation  I have requested a DVH of the following structures: Heart, lungs, lumpectomy cavity.  I have ordered:dose calc.  PLAN:  The patient will receive 50.4 Gy in 28 fractions followed by a boost to the lumpectomy cavity of 12 gray for a cumulative dose of 62.4 gray. Patient will be evaluated for hypo-fractionated accelerated radiation therapy but this will only be possible if there is no overlap over the sternum area with her previous right breast radiation treatments.   Optical Surface Tracking Plan:  Since intensity modulated radiotherapy (IMRT) and 3D conformal radiation treatment methods are predicated on accurate and precise  positioning for treatment, intrafraction motion monitoring is medically necessary to ensure accurate and safe treatment delivery.  The ability to quantify intrafraction motion without excessive ionizing radiation dose can only be performed with optical surface tracking. Accordingly, surface imaging offers the opportunity to obtain 3D measurements of patient position throughout IMRT and 3D treatments without excessive radiation exposure.  I am ordering optical surface tracking for this patient's upcoming course of radiotherapy. ________________________________   -----------------------------------  Blair Promise, PhD, MD

## 2017-03-30 DIAGNOSIS — D0512 Intraductal carcinoma in situ of left breast: Secondary | ICD-10-CM | POA: Diagnosis not present

## 2017-03-30 DIAGNOSIS — Z510 Encounter for antineoplastic radiation therapy: Secondary | ICD-10-CM | POA: Diagnosis not present

## 2017-04-01 ENCOUNTER — Ambulatory Visit
Admission: RE | Admit: 2017-04-01 | Discharge: 2017-04-01 | Disposition: A | Discharge status: Home / Self Care | Payer: PPO | Source: Ambulatory Visit | Attending: Radiation Oncology | Admitting: Radiation Oncology

## 2017-04-01 DIAGNOSIS — D0512 Intraductal carcinoma in situ of left breast: Secondary | ICD-10-CM

## 2017-04-01 DIAGNOSIS — Z510 Encounter for antineoplastic radiation therapy: Secondary | ICD-10-CM | POA: Diagnosis not present

## 2017-04-01 NOTE — Progress Notes (Signed)
  Radiation Oncology         [phone] ________________________________  Name: Debra Henderson MRN: [id number]  Date: 04/01/2017  DOB: 11-06-1945  Simulation Verification Note    ICD-10-CM   1. Ductal carcinoma in situ (DCIS) of left breast D05.12     Status: outpatient  NARRATIVE: The patient was brought to the treatment unit and placed in the planned treatment position. The clinical setup was verified. Then port films were obtained and uploaded to the radiation oncology medical record software.  The treatment beams were carefully compared against the planned radiation fields. The position location and shape of the radiation fields was reviewed. They targeted volume of tissue appears to be appropriately covered by the radiation beams. Organs at risk appear to be excluded as planned.  Based on my personal review, I approved the simulation verification. The patient's treatment will proceed as planned.  -----------------------------------  Blair Promise, PhD, MD

## 2017-04-02 ENCOUNTER — Ambulatory Visit
Admission: RE | Admit: 2017-04-02 | Discharge: 2017-04-02 | Disposition: A | Discharge status: Home / Self Care | Payer: PPO | Source: Ambulatory Visit | Attending: Radiation Oncology | Admitting: Radiation Oncology

## 2017-04-02 DIAGNOSIS — Z510 Encounter for antineoplastic radiation therapy: Secondary | ICD-10-CM | POA: Diagnosis not present

## 2017-04-02 DIAGNOSIS — D0512 Intraductal carcinoma in situ of left breast: Secondary | ICD-10-CM | POA: Diagnosis not present

## 2017-04-03 ENCOUNTER — Ambulatory Visit
Admission: RE | Admit: 2017-04-03 | Discharge: 2017-04-03 | Disposition: A | Discharge status: Home / Self Care | Payer: PPO | Source: Ambulatory Visit | Attending: Radiation Oncology | Admitting: Radiation Oncology

## 2017-04-03 DIAGNOSIS — Z510 Encounter for antineoplastic radiation therapy: Secondary | ICD-10-CM | POA: Diagnosis not present

## 2017-04-03 DIAGNOSIS — D0512 Intraductal carcinoma in situ of left breast: Secondary | ICD-10-CM | POA: Diagnosis not present

## 2017-04-06 ENCOUNTER — Ambulatory Visit
Admission: RE | Admit: 2017-04-06 | Discharge: 2017-04-06 | Disposition: A | Discharge status: Home / Self Care | Payer: PPO | Source: Ambulatory Visit | Attending: Radiation Oncology | Admitting: Radiation Oncology

## 2017-04-06 DIAGNOSIS — D0512 Intraductal carcinoma in situ of left breast: Secondary | ICD-10-CM | POA: Diagnosis not present

## 2017-04-06 DIAGNOSIS — Z510 Encounter for antineoplastic radiation therapy: Secondary | ICD-10-CM | POA: Diagnosis not present

## 2017-04-07 ENCOUNTER — Ambulatory Visit
Admission: RE | Admit: 2017-04-07 | Discharge: 2017-04-07 | Disposition: A | Discharge status: Home / Self Care | Payer: PPO | Source: Ambulatory Visit | Attending: Radiation Oncology | Admitting: Radiation Oncology

## 2017-04-07 DIAGNOSIS — D0512 Intraductal carcinoma in situ of left breast: Secondary | ICD-10-CM | POA: Diagnosis not present

## 2017-04-07 DIAGNOSIS — Z510 Encounter for antineoplastic radiation therapy: Secondary | ICD-10-CM | POA: Diagnosis not present

## 2017-04-07 MED ORDER — SONAFINE EX EMUL
1.0000 "application " | Freq: Two times a day (BID) | CUTANEOUS | Status: DC
  Administered 2017-04-07: 1 via TOPICAL

## 2017-04-07 MED ORDER — ALRA NON-METALLIC DEODORANT (RAD-ONC)
1.0000 "application " | Freq: Once | TOPICAL | Status: AC
  Administered 2017-04-07: 1 via TOPICAL

## 2017-04-07 NOTE — Progress Notes (Addendum)
Pt here for patient teaching.  Pt given Radiation and You booklet, skin care instructions, Alra deodorant and Sonafine.  Reviewed areas of pertinence such as fatigue, skin changes, breast tenderness and breast swelling . Pt able to give teach back of to pat skin and use unscented/gentle soap,apply Sonafine bid, avoid applying anything to skin within 4 hours of treatment, avoid wearing an under wire bra and to use an electric razor if they must shave. Pt verbalizes understanding of information given and will contact nursing with any questions or concerns.  Patient had an allergy to radiaplex  [email or web address]

## 2017-04-08 ENCOUNTER — Ambulatory Visit
Admission: RE | Admit: 2017-04-08 | Discharge: 2017-04-08 | Disposition: A | Discharge status: Home / Self Care | Payer: PPO | Source: Ambulatory Visit | Attending: Radiation Oncology | Admitting: Radiation Oncology

## 2017-04-08 DIAGNOSIS — D0512 Intraductal carcinoma in situ of left breast: Secondary | ICD-10-CM | POA: Diagnosis not present

## 2017-04-08 DIAGNOSIS — Z510 Encounter for antineoplastic radiation therapy: Secondary | ICD-10-CM | POA: Diagnosis not present

## 2017-04-09 ENCOUNTER — Ambulatory Visit
Admission: RE | Admit: 2017-04-09 | Discharge: 2017-04-09 | Disposition: A | Discharge status: Home / Self Care | Payer: PPO | Source: Ambulatory Visit | Attending: Radiation Oncology | Admitting: Radiation Oncology

## 2017-04-09 DIAGNOSIS — Z510 Encounter for antineoplastic radiation therapy: Secondary | ICD-10-CM | POA: Diagnosis not present

## 2017-04-09 DIAGNOSIS — D0512 Intraductal carcinoma in situ of left breast: Secondary | ICD-10-CM | POA: Diagnosis not present

## 2017-04-10 ENCOUNTER — Ambulatory Visit
Admission: RE | Admit: 2017-04-10 | Discharge: 2017-04-10 | Disposition: A | Discharge status: Home / Self Care | Payer: PPO | Source: Ambulatory Visit | Attending: Radiation Oncology | Admitting: Radiation Oncology

## 2017-04-10 DIAGNOSIS — D0512 Intraductal carcinoma in situ of left breast: Secondary | ICD-10-CM | POA: Diagnosis not present

## 2017-04-10 DIAGNOSIS — Z510 Encounter for antineoplastic radiation therapy: Secondary | ICD-10-CM | POA: Diagnosis not present

## 2017-04-13 ENCOUNTER — Ambulatory Visit
Admission: RE | Admit: 2017-04-13 | Discharge: 2017-04-13 | Disposition: A | Discharge status: Home / Self Care | Payer: PPO | Source: Ambulatory Visit | Attending: Radiation Oncology | Admitting: Radiation Oncology

## 2017-04-13 DIAGNOSIS — Z510 Encounter for antineoplastic radiation therapy: Secondary | ICD-10-CM | POA: Diagnosis not present

## 2017-04-13 DIAGNOSIS — D0512 Intraductal carcinoma in situ of left breast: Secondary | ICD-10-CM | POA: Diagnosis not present

## 2017-04-14 ENCOUNTER — Ambulatory Visit
Admission: RE | Admit: 2017-04-14 | Discharge: 2017-04-14 | Disposition: A | Discharge status: Home / Self Care | Payer: PPO | Source: Ambulatory Visit | Attending: Radiation Oncology | Admitting: Radiation Oncology

## 2017-04-14 DIAGNOSIS — Z510 Encounter for antineoplastic radiation therapy: Secondary | ICD-10-CM | POA: Diagnosis not present

## 2017-04-14 DIAGNOSIS — D0512 Intraductal carcinoma in situ of left breast: Secondary | ICD-10-CM | POA: Diagnosis not present

## 2017-04-15 ENCOUNTER — Ambulatory Visit
Admission: RE | Admit: 2017-04-15 | Discharge: 2017-04-15 | Disposition: A | Discharge status: Home / Self Care | Payer: PPO | Source: Ambulatory Visit | Attending: Radiation Oncology | Admitting: Radiation Oncology

## 2017-04-15 DIAGNOSIS — D0512 Intraductal carcinoma in situ of left breast: Secondary | ICD-10-CM | POA: Diagnosis not present

## 2017-04-15 DIAGNOSIS — Z510 Encounter for antineoplastic radiation therapy: Secondary | ICD-10-CM | POA: Diagnosis not present

## 2017-04-16 ENCOUNTER — Ambulatory Visit
Admission: RE | Admit: 2017-04-16 | Discharge: 2017-04-16 | Disposition: A | Discharge status: Home / Self Care | Payer: PPO | Source: Ambulatory Visit | Attending: Radiation Oncology | Admitting: Radiation Oncology

## 2017-04-16 DIAGNOSIS — Z510 Encounter for antineoplastic radiation therapy: Secondary | ICD-10-CM | POA: Diagnosis not present

## 2017-04-16 DIAGNOSIS — D0512 Intraductal carcinoma in situ of left breast: Secondary | ICD-10-CM | POA: Diagnosis not present

## 2017-04-17 ENCOUNTER — Ambulatory Visit
Admission: RE | Admit: 2017-04-17 | Discharge: 2017-04-17 | Disposition: A | Discharge status: Home / Self Care | Payer: PPO | Source: Ambulatory Visit | Attending: Radiation Oncology | Admitting: Radiation Oncology

## 2017-04-17 DIAGNOSIS — D0512 Intraductal carcinoma in situ of left breast: Secondary | ICD-10-CM | POA: Diagnosis not present

## 2017-04-17 DIAGNOSIS — Z510 Encounter for antineoplastic radiation therapy: Secondary | ICD-10-CM | POA: Diagnosis not present

## 2017-04-20 ENCOUNTER — Ambulatory Visit
Admission: RE | Admit: 2017-04-20 | Discharge: 2017-04-20 | Disposition: A | Discharge status: Home / Self Care | Payer: PPO | Source: Ambulatory Visit | Attending: Radiation Oncology | Admitting: Radiation Oncology

## 2017-04-20 DIAGNOSIS — Z170 Estrogen receptor positive status [ER+]: Secondary | ICD-10-CM | POA: Diagnosis not present

## 2017-04-20 DIAGNOSIS — Z510 Encounter for antineoplastic radiation therapy: Secondary | ICD-10-CM | POA: Insufficient documentation

## 2017-04-20 DIAGNOSIS — D0512 Intraductal carcinoma in situ of left breast: Secondary | ICD-10-CM | POA: Diagnosis not present

## 2017-04-21 ENCOUNTER — Ambulatory Visit
Admission: RE | Admit: 2017-04-21 | Discharge: 2017-04-21 | Disposition: A | Discharge status: Home / Self Care | Payer: PPO | Source: Ambulatory Visit | Attending: Radiation Oncology | Admitting: Radiation Oncology

## 2017-04-21 ENCOUNTER — Ambulatory Visit: Payer: PPO | Admitting: Radiation Oncology

## 2017-04-21 DIAGNOSIS — D0512 Intraductal carcinoma in situ of left breast: Secondary | ICD-10-CM | POA: Diagnosis not present

## 2017-04-21 DIAGNOSIS — Z510 Encounter for antineoplastic radiation therapy: Secondary | ICD-10-CM | POA: Diagnosis not present

## 2017-04-21 MED ORDER — SONAFINE EX EMUL: 1.0000 "application " | Freq: Two times a day (BID) | CUTANEOUS | Status: DC

## 2017-04-22 ENCOUNTER — Ambulatory Visit
Admission: RE | Admit: 2017-04-22 | Discharge: 2017-04-22 | Disposition: A | Discharge status: Home / Self Care | Payer: PPO | Source: Ambulatory Visit | Attending: Radiation Oncology | Admitting: Radiation Oncology

## 2017-04-22 DIAGNOSIS — D0512 Intraductal carcinoma in situ of left breast: Secondary | ICD-10-CM | POA: Diagnosis not present

## 2017-04-22 DIAGNOSIS — Z510 Encounter for antineoplastic radiation therapy: Secondary | ICD-10-CM | POA: Diagnosis not present

## 2017-04-23 ENCOUNTER — Ambulatory Visit
Admission: RE | Admit: 2017-04-23 | Discharge: 2017-04-23 | Disposition: A | Discharge status: Home / Self Care | Payer: PPO | Source: Ambulatory Visit | Attending: Radiation Oncology | Admitting: Radiation Oncology

## 2017-04-23 DIAGNOSIS — Z510 Encounter for antineoplastic radiation therapy: Secondary | ICD-10-CM | POA: Diagnosis not present

## 2017-04-23 DIAGNOSIS — D0512 Intraductal carcinoma in situ of left breast: Secondary | ICD-10-CM | POA: Diagnosis not present

## 2017-04-24 ENCOUNTER — Ambulatory Visit
Admission: RE | Admit: 2017-04-24 | Discharge: 2017-04-24 | Disposition: A | Discharge status: Home / Self Care | Payer: PPO | Source: Ambulatory Visit | Attending: Radiation Oncology | Admitting: Radiation Oncology

## 2017-04-24 DIAGNOSIS — Z510 Encounter for antineoplastic radiation therapy: Secondary | ICD-10-CM | POA: Diagnosis not present

## 2017-04-24 DIAGNOSIS — D0512 Intraductal carcinoma in situ of left breast: Secondary | ICD-10-CM | POA: Diagnosis not present

## 2017-04-27 ENCOUNTER — Ambulatory Visit
Admission: RE | Admit: 2017-04-27 | Discharge: 2017-04-27 | Disposition: A | Discharge status: Home / Self Care | Payer: PPO | Source: Ambulatory Visit | Attending: Radiation Oncology | Admitting: Radiation Oncology

## 2017-04-27 DIAGNOSIS — Z510 Encounter for antineoplastic radiation therapy: Secondary | ICD-10-CM | POA: Diagnosis not present

## 2017-04-27 DIAGNOSIS — D0512 Intraductal carcinoma in situ of left breast: Secondary | ICD-10-CM | POA: Diagnosis not present

## 2017-04-28 ENCOUNTER — Ambulatory Visit
Admission: RE | Admit: 2017-04-28 | Discharge: 2017-04-28 | Disposition: A | Discharge status: Home / Self Care | Payer: PPO | Source: Ambulatory Visit | Attending: Radiation Oncology | Admitting: Radiation Oncology

## 2017-04-28 DIAGNOSIS — D0512 Intraductal carcinoma in situ of left breast: Secondary | ICD-10-CM | POA: Diagnosis not present

## 2017-04-28 DIAGNOSIS — Z510 Encounter for antineoplastic radiation therapy: Secondary | ICD-10-CM | POA: Diagnosis not present

## 2017-04-28 MED ORDER — SONAFINE EX EMUL
1.0000 "application " | Freq: Once | CUTANEOUS | Status: AC
  Administered 2017-04-28: 1 via TOPICAL

## 2017-04-29 ENCOUNTER — Ambulatory Visit
Admission: RE | Admit: 2017-04-29 | Discharge: 2017-04-29 | Disposition: A | Discharge status: Home / Self Care | Payer: PPO | Source: Ambulatory Visit | Attending: Radiation Oncology | Admitting: Radiation Oncology

## 2017-04-29 DIAGNOSIS — D0512 Intraductal carcinoma in situ of left breast: Secondary | ICD-10-CM | POA: Diagnosis not present

## 2017-04-29 DIAGNOSIS — Z510 Encounter for antineoplastic radiation therapy: Secondary | ICD-10-CM | POA: Diagnosis not present

## 2017-04-30 ENCOUNTER — Ambulatory Visit: Payer: PPO

## 2017-04-30 ENCOUNTER — Ambulatory Visit
Admission: RE | Admit: 2017-04-30 | Discharge: 2017-04-30 | Disposition: A | Discharge status: Home / Self Care | Payer: PPO | Source: Ambulatory Visit | Attending: Radiation Oncology | Admitting: Radiation Oncology

## 2017-04-30 DIAGNOSIS — Z510 Encounter for antineoplastic radiation therapy: Secondary | ICD-10-CM | POA: Diagnosis not present

## 2017-04-30 DIAGNOSIS — D0512 Intraductal carcinoma in situ of left breast: Secondary | ICD-10-CM | POA: Diagnosis not present

## 2017-05-01 ENCOUNTER — Ambulatory Visit: Payer: PPO

## 2017-05-04 ENCOUNTER — Ambulatory Visit: Payer: PPO

## 2017-05-04 ENCOUNTER — Encounter: Payer: Self-pay | Admitting: Radiation Oncology

## 2017-05-04 NOTE — Progress Notes (Signed)
  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 During treatment the patient complained of an occasional burning, stabbing pain to the left breast. She also noted increased fatigue. The treatment area was erythematous, no skin breakdown.  Plan: The patient has completed radiation treatment. The patient will return to radiation oncology clinic for routine followup in one month. I advised them to call or return sooner if they have any questions or concerns related to their recovery or treatment.  -----------------------------------  Blair Promise, PhD, MD  This document serves as a record of services personally performed by Gery Pray, MD. It was created on his behalf by Bethann Humble, a trained medical scribe. The creation of this record is based on the scribe's personal observations and the provider's statements to them. This document has been checked and approved by the attending provider.

## 2017-05-05 ENCOUNTER — Ambulatory Visit: Payer: PPO

## 2017-05-05 ENCOUNTER — Ambulatory Visit: Payer: PPO | Admitting: Radiation Oncology

## 2017-05-06 ENCOUNTER — Ambulatory Visit: Payer: PPO

## 2017-05-07 ENCOUNTER — Ambulatory Visit: Payer: PPO

## 2017-05-08 ENCOUNTER — Ambulatory Visit: Payer: PPO

## 2017-05-11 ENCOUNTER — Ambulatory Visit: Payer: PPO

## 2017-05-12 ENCOUNTER — Ambulatory Visit: Payer: PPO

## 2017-05-13 ENCOUNTER — Ambulatory Visit: Payer: PPO

## 2017-05-31 NOTE — Progress Notes (Signed)
[place]  [phone] [phone]     ID: Debra Henderson OB: August 16, 1945  MR#: [id number]  OHY#:[id number]  Patient Care Team: [name], MD as PCP - General (Internal Medicine) Jovita Kussmaul, MD as Consulting Physician (General Surgery) Magrinat, Virgie Dad, MD as Consulting Physician (Oncology) Gery Pray, MD as Consulting Physician (Radiation Oncology)    CHIEF COMPLAINT:  Hx of Right Breast ductal carcinoma in situ; new left breast DCIS  CURRENT TREATMENT: anastrozole  INTERVAL HISTORY: Debra Henderson returns today for follow-up of her remote right breast noninvasive cancer, and her new left breast ductal carcinoma in situ.  She underwent left lumpectomy ([id number]) on 02/06/2017 with results showing: Breast, lumpectomy, left with ductal carcinoma in site, low grade. no invasive malignancy identified. focal involvement of adenosis by DCIS. Benign breast changes consisting of adenosis with calcification, sclerosing adenosine with calcifications, complex sclerosing lesion, intraductal papilloma, and an old fibroadenoma.   Since her last visit to the office, she completed radiation with dates of: 04/02/17-04/30/17 to the site/dose of 1) Left breast/ 40.05 Gy in 15 fractions. 2) Left breast boost/ 12 Gy in 6 fractions. She tolerated radiation well, adding it made her skin dry. She continues to use the cream that was given to her for relief.  She had a colonoscopy ([id number]) completed on 03/04/2017 with results of: Surgical [P], sigmoid, transverse and cecum, polyp (5) with tubular adenoma (s). High grade dysplasia is not identified. Surgical [P], sigmoid polyp with benign polypoid colorectal mucosa. There is no evidence of malignancy.    REVIEW OF SYSTEMS: Debra Henderson is doing well overall. She denies unusual headaches, visual changes, nausea, vomiting, or dizziness. There has been no unusual cough, phlegm production, or pleurisy. This been no change in  bowel or bladder habits. She denies unexplained fatigue or unexplained weight loss, bleeding, rash, or fever. A detailed review of systems was otherwise noncontributory.    RIGHT BREAST CANCER HISTORY: From the original intake note:   Debra Henderson underwent screening mammography in December 2013. Calcifications were noted in the right breast, requiring further evaluation.  Subsequently, a stereotactic right breast biopsy was obtained on 02/13/2012 which confirmed ductal carcinoma in situ with calcifications, low grade, ER and PR positive, both at 100%. [phone])  The patient underwent a right lumpectomy under the care of Dr. Marlou Starks on 04/22/2012. (Surgery have been delayed only slightly secondary to the patient's cardiac history and the requirements of restaging of the left common iliac artery prior to surgery.) Final pathology showed a low-grade ductal carcinoma in situ with a 0.1 cm focus of disease, and close to the posterior margin at 0.1 cm.  Atypical lobular hyperplasia was also noted. PTis, pNX. [phone]).  The patient was treated with adjuvant radiation therapy under the care of Dr. Pablo Ledger, completed 08/23/2012.  Her subsequent history is as detailed below.      PAST MEDICAL HISTORY: Past Medical History:  Diagnosis Date  . Asthma   . Breast cancer ([place]) 02/13/12   right upper outer- DCIS, ER/PR+  . Cancer [facility]) 2014   breast cancer  . Chronic kidney disease    renal artery stenosis  . Claudication [facility])    lower extremities  . Complication of anesthesia    ' It does not work " I am difficult to put tpo sleep  . Family history of anesthesia complication    my brother is difficult to put to sleep also  . GERD (gastroesophageal reflux disease)    otc  . History  of renal stent    LEA DUPLEX, 03/16/2012 - LEFT EIA DISTAL/COMMON FEMORAL ARTERY-demonstrated occlusive disease  . Hyperlipidemia   . Hypertension   . Incontinence   . Peripheral vascular disease ([place])    prior  stenting of left iliac artery  . Personal history of radiation therapy   . Radiation 08/02/12-08/23/12   Right breast 42.72 Gy x 16 fx  . Renal artery stenosis ([facility])    RENAL DOPPLER, 02/12/2011 - RIGHT RENAL ARTERY 60-99% diameter reduction, LEFT RENAL ARTERY AT STENT 60-99% diameter reduction    PAST SURGICAL HISTORY: Past Surgical History:  Procedure Laterality Date  . ANGIOPLASTY ILLIAC ARTERY  02/24/2012   Dr Gwenlyn Found  L iliac restent  . ATHERECTOMY N/A 02/24/2012   Procedure: ATHERECTOMY;  Surgeon: Lorretta Harp, MD;  Location: [facility] CATH LAB;  Service: Cardiovascular;  Laterality: N/A;  . BLADDER SUSPENSION  1980's  . bladder tack    . BREAST BIOPSY Right 05/2013  . BREAST BIOPSY Right 02/13/2012  . BREAST LUMPECTOMY Right 05/02/2012  . BREAST LUMPECTOMY WITH NEEDLE LOCALIZATION Right 04/22/2012   Procedure: RIGHT BREAST WIRE LOCALIZATION  LUMPECTOMY ;  Surgeon: Merrie Roof, MD;  Location: [place];  Service: General;  Laterality: Right;  . BREAST LUMPECTOMY WITH RADIOACTIVE SEED LOCALIZATION Left 02/06/2017   Procedure: BREAST LUMPECTOMY WITH RADIOACTIVE SEED LOCALIZATION;  Surgeon: Jovita Kussmaul, MD;  Location: [place];  Service: General;  Laterality: Left;  . fibroid breast     left breast-adenoma benign 1980's  . ILIAC ARTERY STENT  11/21/19/02   left   . NM MYOVIEW LTD  03/21/2010   normal myocardial perfusion study  . PERIPHERAL VASCULAR CATHETERIZATION Left 02/24/2012   Common iliac artery, 8x38 iCast Stent, resulting in a reduction from 80% in-stent restenosis to 0% residual  . PERIPHERAL VASCULAR CATHETERIZATION Left 08/04/2006   Renal 90% stenosis, 3.5x 13 drug-eluting stent resulting in a reduction of 90% in-stent restenosis to 0% residual  . PERIPHERAL VASCULAR CATHETERIZATION Left 05/22/2005   Renal 80% in-stent stenosis, 5x15 Aviator resulting in a reduction of 80% in-stent restenosis to 0% residual  . PERIPHERAL VASCULAR CATHETERIZATION Left 02/22/2002   Renal  in-stent restenosis, 5x15 Guidant rapid-exchange balloon resulting in reduction of a 95% in-stent restenosis to less than 20% residual  . PERIPHERAL VASCULAR CATHETERIZATION Left 12/10/2000   95% renal stenosis, 6x37mm Genesis Aviator balloon/stent deployed at 10 atmospheres resulting in a reduction  of 95% stenosis to 0% residual; Common iliac artery stenosis, P12x4 mounted on a 7x2 Powerflex balloon resulting in a reduction of 80% to 0% residual  . PERIPHERAL VASCULAR CATHETERIZATION Left 07/27/2000   95% renal stenosis, 62mm x 6cm Smart stent resulting in a reduction of 90-95%  to 0% residual  . stent /kidney  12/10/00,08/04/06   2002 R renal artery, 2008 L renal  . TUBAL LIGATION     1980's    FAMILY HISTORY (reviewed 06/07/2013) Family History  Problem Relation Age of Onset  . Cancer Mother        colon  . COPD Mother   . Cancer Father        brain  . Stroke Sister   . Heart attack Sister   Mother died at the age of 25. Father died at the age of 75. She has one older brother who has Crohn's disease. She has a younger sister who has multiple health issues secondary to having been a premature infant.  There is no known history of  breast or GYN cancer in the family.  GYNECOLOGIC HISTORY:   (Reviewed 06/07/2013) [place] P2;  she took birth control pills for approximately 2 years in her 16s with good tolerance. She went through menopause naturally, approximately age 23.  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 Her husband, Jori Moll, is retired.  Both have significant chronic medical problems.  They have 2 daughters, one who lives locally, another in [place]. She has 2 grandchildren, ages 38 months and 31 years.    ADVANCED DIRECTIVES: Not in place   HEALTH MAINTENANCE:  (Reviewed 06/07/2013) Social History   Tobacco Use  . Smoking status: Former Smoker    Packs/day: 1.00    Types: Cigarettes    Last attempt to quit: 02/24/1988    Years since  quitting: 29.2  . Smokeless tobacco: Never Used  . Tobacco comment: quit 1990  Substance Use Topics  . Alcohol use: No  . Drug use: No     Colonoscopy: Not on file  PAP: Not on file  Bone density: 2008, normal  Lipid panel: Not on file  Allergies  Allergen Reactions  . Tetracyclines & Related Nausea And Vomiting and Other (See Comments)    Vomiting blood  . Vytorin [Ezetimibe-Simvastatin] Other (See Comments)    Per Dr Adora Fridge note  . Chicken Allergy Diarrhea and Rash  . Eggs Or Egg-Derived Products Diarrhea and Rash  . Erythromycin Rash  . Lipitor [Atorvastatin] Rash and Other (See Comments)    Myalgias   . Penicillins Swelling and Rash  . Sulfa Antibiotics Swelling and Rash    Current Outpatient Medications  Medication Sig Dispense Refill  . aspirin 81 MG tablet Take 81 mg by mouth daily.    . clopidogrel (PLAVIX) 75 MG tablet TAKE1 TABLET EVERY DAY  3  . losartan-hydrochlorothiazide (HYZAAR) 50-12.5 MG per tablet TAKE 1 TABLET BY MOUTH EVERY DAY 30 tablet 0  . omega-3 acid ethyl esters (LOVAZA) 1 G capsule Take 1 g by mouth 2 (two) times daily.    . OXYBUTYNIN CHLORIDE PO Take by mouth.     Current Facility-Administered Medications  Medication Dose Route Frequency Provider Last Rate Last Dose  . 0.9 %  sodium chloride infusion  500 mL Intravenous Once Armbruster, Carlota Raspberry, MD        OBJECTIVE: Middle-aged white woman in no acute distress Vitals:   06/01/17 1449  BP: (!) 138/59  Pulse: 78  Resp: 18  Temp: (!) 78 F (25.6 C)  SpO2: 99%     Body mass index is 25.64 kg/m.    ECOG FS: 1 Filed Weights   06/01/17 1449  Weight: 131 lb 4.8 oz (59.6 kg)   Sclerae unicteric, EOMs intact Oropharynx clear and moist No cervical or supraclavicular adenopathy Lungs no rales or rhonchi Heart regular rate and rhythm Abd soft, nontender, positive bowel sounds MSK no focal spinal tenderness, no upper extremity lymphedema Neuro: nonfocal, well oriented, appropriate  affect Breasts: The right breast is status post remote lumpectomy.  There is no evidence of local recurrence.  The left breast is status post lumpectomy and recent radiation.  There is still some dry desquamation and discoloration, but no evidence of residual or recurrent disease.  Both axillae are benign  LAB RESULTS:  CMP    Lab Results  Component Value Date   WBC 9.1 02/03/2017   NEUTROABS 5.4 02/03/2017   HGB 11.4 (L) 02/03/2017   HCT 35.5 (L) 02/03/2017   MCV 95.9 02/03/2017  PLT 441 (H) 02/03/2017      Chemistry      Component Value Date/Time   NA 131 (L) 02/03/2017 0952   NA 138 10/03/2013 1505   K 3.2 (L) 02/03/2017 0952   K 3.2 (L) 10/03/2013 1505   CL 95 (L) 02/03/2017 0952   CL 100 05/20/2012 1454   CO2 24 02/03/2017 0952   CO2 27 10/03/2013 1505   BUN 15 02/03/2017 0952   BUN 7.2 10/03/2013 1505   CREATININE 0.88 02/03/2017 0952   CREATININE 0.8 10/03/2013 1505      Component Value Date/Time   CALCIUM 9.2 02/03/2017 0952   CALCIUM 9.5 10/03/2013 1505   ALKPHOS 68 02/03/2017 0952   ALKPHOS 76 10/03/2013 1505   AST 13 (L) 02/03/2017 0952   AST 11 10/03/2013 1505   ALT 10 (L) 02/03/2017 0952   ALT 9 10/03/2013 1505   BILITOT 0.4 02/03/2017 0952   BILITOT 0.31 10/03/2013 1505       STUDIES: Since her last visit, she underwent diagnostic bilateral mammography with CAD and tomography and left breast ultrasonography on 12/16/2016 at [facility] showing: breast density category B. Indeterminate left breast multi-cystic mass. Findings likely represent fibrocystic changes. No suspicious left axillary lymphadenopathy. Stable right breast postsurgical changes without mammographic evidence of malignancy.  She the proceeded to left breast biopsy ([id number]) on 12/25/2016 showing: low grade ductal carcinoma in situ. Prognostic indicators significant for: ER, 100% positive with strong staining intensity and PR, 60% positive with moderate staining intensity.     ASSESSMENT: 72 y.o. Debra Henderson woman:  1)  status post  right lumpectomy April 2014 for a low-grade ductal carcinoma in situ. Focally close to the posterior margin at 0.1 cm. ER +100%, PR +100%.  2) Status post radiation therapy under the care of Dr. Pablo Ledger, completed 08/23/2012  3) decided against antiestrogens June 2015  4)  multiple comorbidities including peripheral vascular disease with stenting of the left iliac artery; hypertension; claudication; and a history of renal artery stenosis, with stenting of the left renal artery.  5) right breast upper inner quadrant biopsy 06/17/2013 showed a complex sclerosing lesion  6) status post left breast upper outer quadrant biopsy 12/25/2016 showing ductal carcinoma in situ, low-grade, estrogen and progesterone receptor positive.  7) left lumpectomy 02/06/2017 ductal carcinoma in situ, grade 1, measuring 1.0 cm, with negative margins  8) adjuvant radiation 04/02/17-04/30/17 Site/dose: 1) Left breast/ 40.05 Gy in 15 fractions 2) Left breast boost/ 12 Gy in 6 fractions  9) anastrozole started 06/01/2017  PLAN: Debra Henderson has completed her local treatment for her new left-sided noninvasive breast cancer.  She is still recovering from the radiation but in general she has done terrific.  She is now ready to discuss antiestrogens. We  the difference between tamoxifen and anastrozole in detail. She understands that anastrozole and the aromatase inhibitors in general work by blocking estrogen production. Accordingly vaginal dryness, decrease in bone density, and of course hot flashes can result. The aromatase inhibitors can also negatively affect the cholesterol profile, although that is a minor effect. One out of 5 women on aromatase inhibitors we will feel "old and achy". This arthralgia/myalgia syndrome, which resembles fibromyalgia clinically, does resolve with stopping the medications. Accordingly this is not a reason to not try an aromatase  inhibitor but it is a frequent reason to stop it (in other words 20% of women will not be able to tolerate these medications).  Tamoxifen on the other hand does not block estrogen  production. It does not "take away a woman's estrogen". It blocks the estrogen receptor in breast cells. Like anastrozole, it can also cause hot flashes. As opposed to anastrozole, tamoxifen has many estrogen-like effects. It is technically an estrogen receptor modulator. This means that in some tissues tamoxifen works like estrogen-- for example it helps strengthen the bones. It tends to improve the cholesterol profile. It can cause thickening of the endometrial lining, and even endometrial polyps or rarely cancer of the uterus.(The risk of uterine cancer due to tamoxifen is one additional cancer per thousand women year). It can cause vaginal wetness or stickiness. It can cause blood clots through this estrogen-like effect--the risk of blood clots with tamoxifen is exactly the same as with birth control pills or hormone replacement.  Neither of these agents causes mood changes or weight gain, despite the popular belief that they can have these side effects. We have data from studies comparing either of these drugs with placebo, and in those cases the control group had the same amount of weight gain and depression as the group that took the drug.  After all this discussion, Debra Henderson would prefer to try anastrozole.  I went ahead and placed the prescription in for her.  I also wrote for a bone density to be done at the breast center sometime in the next 2 months which will be our new baseline.  I also suggest that she start vitamin D supplementation now.  She will call with any problems that may develop before next visit which will be in August.  If all goes well I will start seeing her on a once a year basis from that point     Magrinat, Virgie Dad, MD  06/01/17 2:50 PM Medical Oncology and Hematology [facility] [place], [place] [address] Tel. [phone]    Fax. [phone]  This document serves as a record of services personally performed by Chauncey Cruel, MD. It was created on his behalf by Margit Banda, a trained medical scribe. The creation of this record is based on the scribe's personal observations and the provider's statements to them.   I have reviewed the above documentation for accuracy and completeness, and I agree with the above.

## 2017-06-01 ENCOUNTER — Other Ambulatory Visit: Payer: Self-pay

## 2017-06-01 ENCOUNTER — Encounter: Payer: Self-pay | Admitting: Radiation Oncology

## 2017-06-01 ENCOUNTER — Ambulatory Visit
Admission: RE | Admit: 2017-06-01 | Discharge: 2017-06-01 | Disposition: A | Discharge status: Home / Self Care | Payer: PPO | Source: Ambulatory Visit | Attending: Radiation Oncology | Admitting: Radiation Oncology

## 2017-06-01 ENCOUNTER — Telehealth: Payer: Self-pay | Admitting: Oncology

## 2017-06-01 ENCOUNTER — Inpatient Hospital Stay: Payer: PPO | Attending: Oncology | Admitting: Oncology

## 2017-06-01 VITALS — BP 141/69 | HR 80 | Temp 98.4°F | Resp 20 | Wt 131.0 lb

## 2017-06-01 VITALS — BP 138/59 | HR 78 | Temp 78.0°F | Resp 18 | Ht 60.0 in | Wt 131.3 lb

## 2017-06-01 DIAGNOSIS — Z86000 Personal history of in-situ neoplasm of breast: Secondary | ICD-10-CM | POA: Diagnosis not present

## 2017-06-01 DIAGNOSIS — Z923 Personal history of irradiation: Secondary | ICD-10-CM | POA: Insufficient documentation

## 2017-06-01 DIAGNOSIS — D0512 Intraductal carcinoma in situ of left breast: Secondary | ICD-10-CM | POA: Insufficient documentation

## 2017-06-01 DIAGNOSIS — D0511 Intraductal carcinoma in situ of right breast: Secondary | ICD-10-CM

## 2017-06-01 DIAGNOSIS — Z79899 Other long term (current) drug therapy: Secondary | ICD-10-CM | POA: Diagnosis not present

## 2017-06-01 DIAGNOSIS — Z79811 Long term (current) use of aromatase inhibitors: Secondary | ICD-10-CM | POA: Insufficient documentation

## 2017-06-01 DIAGNOSIS — Z170 Estrogen receptor positive status [ER+]: Secondary | ICD-10-CM | POA: Diagnosis not present

## 2017-06-01 DIAGNOSIS — Z7982 Long term (current) use of aspirin: Secondary | ICD-10-CM | POA: Insufficient documentation

## 2017-06-01 MED ORDER — ANASTROZOLE 1 MG PO TABS: 1.0000 mg | ORAL_TABLET | Freq: Every day | ORAL | 4 refills | Status: DC

## 2017-06-01 NOTE — Telephone Encounter (Signed)
Gave patient AVs and calendar of upcoming august appointments.  °

## 2017-06-01 NOTE — Progress Notes (Signed)
Radiation Oncology         [phone] ________________________________  Name: Debra Henderson MRN: [id number]  Date: 06/01/2017  DOB: 02/14/1945  Follow-Up Visit Note  CC: [name], MD  Jovita Kussmaul, MD    ICD-10-CM   1. Ductal carcinoma in situ (DCIS) of right breast D05.11     Diagnosis:   72 y.o. female with Low grade DCIS of the left breast, ER+, PR+   Interval Since Last Radiation:  1 month Radiation treatment dates:  04/02/2017-04/30/2017 Site/dose:  1. Left breast / 40.05 Gy in 15 fractions 2. Left breast boost / 12 Gy in 6 fractions  Narrative:  The patient returns today for routine follow-up.  She reports moderate fatigue. She reports dull pain in her left breast area and occasional sharp shooting pains that last about 1 second. She denies any cough or breathing issues. She reports some discoloration to her left breast area. She states that her skin is dry, and she has a little peeling in her nipple area. She reports some itching in between her breasts. She states that she is using her sonafine cream. She saw Dr. Jana Hakim earlier today and was started on anastrozole.                                ALLERGIES:  is allergic to tetracyclines & related; vytorin [ezetimibe-simvastatin]; chicken allergy; eggs or egg-derived products; erythromycin; lipitor [atorvastatin]; penicillins; and sulfa antibiotics.  Meds: Current Outpatient Medications  Medication Sig Dispense Refill  . aspirin 81 MG tablet Take 81 mg by mouth daily.    . clopidogrel (PLAVIX) 75 MG tablet TAKE1 TABLET EVERY DAY  3  . omega-3 acid ethyl esters (LOVAZA) 1 G capsule Take 1 g by mouth 2 (two) times daily.    . OXYBUTYNIN CHLORIDE PO Take by mouth.    Marland Kitchen anastrozole (ARIMIDEX) 1 MG tablet Take 1 tablet (1 mg total) by mouth daily. (Patient not taking: Reported on 06/01/2017) 90 tablet 4  . losartan-hydrochlorothiazide (HYZAAR) 50-12.5 MG per tablet TAKE 1 TABLET BY MOUTH EVERY DAY (Patient not  taking: Reported on 06/01/2017) 30 tablet 0   Current Facility-Administered Medications  Medication Dose Route Frequency Provider Last Rate Last Dose  . 0.9 %  sodium chloride infusion  500 mL Intravenous Once Armbruster, Carlota Raspberry, MD        Physical Findings: The patient is in no acute distress. Patient is alert and oriented.  weight is 131 lb (59.4 kg). Her oral temperature is 98.4 F (36.9 C). Her blood pressure is 141/69 (abnormal) and her pulse is 80. Her respiration is 20 and oxygen saturation is 100%.   Lungs are clear to auscultation bilaterally. Heart has regular rate and rhythm. No palpable cervical, supraclavicular, or axillary adenopathy. Abdomen soft, non-tender, normal bowel sounds.  Breast Exam Right Breast: No palpable mass or nipple discharge. Tattoos present from prior radiation therapy. Left Breast: Patient continues to have some mild edema in the breast and hyperpigmentation changes. Patient's skin has healed well. No palpable or visible signs of recurrence.  Lab Findings: Lab Results  Component Value Date   WBC 9.1 02/03/2017   HGB 11.4 (L) 02/03/2017   HCT 35.5 (L) 02/03/2017   MCV 95.9 02/03/2017   PLT 441 (H) 02/03/2017    Radiographic Findings: No results found.  Impression:  Low grade DCIS of the left breast, ER+, PR+. The patient is recovering from the  effects of radiation.  No evidence of recurrence on clinical exam.   Plan:  PRN follow-up in radiation oncology. She will continue close f/u with med/onc and surgery.  ____________________________________  Blair Promise, PhD, MD  This document serves as a record of services personally performed by Gery Pray, MD. It was created on his behalf by Rae Lips, a trained medical scribe. The creation of this record is based on the scribe's personal observations and the provider's statements to them. This document has been checked and approved by the attending provider.

## 2017-06-01 NOTE — Progress Notes (Signed)
Patient states that she has sharp shooting pains and dull pain in her breast area. States that she has moderate fatigue.States that she has some discoloration to her breast area. States that her skin is dry.States that she has a little peeling in her nipple area. States that she has some itching between her breast area. Patient states that she is using her sonafine cream . Vitals:   06/01/17 1527  BP: (!) 141/69  Pulse: 80  Resp: 20  Temp: 98.4 F (36.9 C)  TempSrc: Oral  SpO2: 100%  Weight: 131 lb (59.4 kg)   Wt Readings from Last 3 Encounters:  06/01/17 131 lb (59.4 kg)  06/01/17 131 lb 4.8 oz (59.6 kg)  03/25/17 131 lb 9.6 oz (59.7 kg)

## 2017-07-15 ENCOUNTER — Ambulatory Visit
Admission: RE | Admit: 2017-07-15 | Discharge: 2017-07-15 | Disposition: A | Discharge status: Home / Self Care | Payer: PPO | Source: Ambulatory Visit | Attending: Oncology | Admitting: Oncology

## 2017-07-15 DIAGNOSIS — D0511 Intraductal carcinoma in situ of right breast: Secondary | ICD-10-CM

## 2017-07-15 DIAGNOSIS — D0512 Intraductal carcinoma in situ of left breast: Secondary | ICD-10-CM

## 2017-07-15 DIAGNOSIS — M810 Age-related osteoporosis without current pathological fracture: Secondary | ICD-10-CM | POA: Diagnosis not present

## 2017-07-15 DIAGNOSIS — Z780 Asymptomatic menopausal state: Secondary | ICD-10-CM | POA: Diagnosis not present

## 2017-07-29 DIAGNOSIS — Z1389 Encounter for screening for other disorder: Secondary | ICD-10-CM | POA: Diagnosis not present

## 2017-07-29 DIAGNOSIS — I10 Essential (primary) hypertension: Secondary | ICD-10-CM | POA: Diagnosis not present

## 2017-07-29 DIAGNOSIS — E782 Mixed hyperlipidemia: Secondary | ICD-10-CM | POA: Diagnosis not present

## 2017-07-29 DIAGNOSIS — Z6825 Body mass index (BMI) 25.0-25.9, adult: Secondary | ICD-10-CM | POA: Diagnosis not present

## 2017-07-29 DIAGNOSIS — Z0001 Encounter for general adult medical examination with abnormal findings: Secondary | ICD-10-CM | POA: Diagnosis not present

## 2017-09-09 DIAGNOSIS — D0512 Intraductal carcinoma in situ of left breast: Secondary | ICD-10-CM | POA: Diagnosis not present

## 2017-09-13 NOTE — Progress Notes (Signed)
[facility]  [phone] Fax:([phone]) [phone]     ID: Debra Henderson OB: March 29, 1945  MR#: [id number]  FWY#:[id number]  Patient Care Team: [name], MD as PCP - General (Internal Medicine) Jovita Kussmaul, MD as Consulting Physician (General Surgery) Magrinat, Virgie Dad, MD as Consulting Physician (Oncology) Gery Pray, MD as Consulting Physician (Radiation Oncology)    CHIEF COMPLAINT:  Hx of Right Breast ductal carcinoma in situ; new left breast DCIS  CURRENT TREATMENT: anastrozole; [zolendronate]  INTERVAL HISTORY: Debra Henderson returns today for follow-up of her remote right breast noninvasive cancer, and her new left breast ductal carcinoma in situ. She continues on anastrozole, with good tolerance. She denies issues with hot flashes or vaginal dryness. She obtains this at a reasonable cost.   Since her last visit, she completed a bone density on 07/15/2017 which showed a T score of -2.7 (osteoporosis).   REVIEW OF SYSTEMS: Debra Henderson reports that she needs to exercise more. She has some quick "lightening" pain in both breasts that happen a few times per year. She also had some shoulder stiffness. She never had physical therapy after her lumpectomy, and she would like to learn about ROM exercises. Her family is doing well. She denies unusual headaches, visual changes, nausea, vomiting, or dizziness. There has been no unusual cough, phlegm production, or pleurisy. There has been no change in bowel or bladder habits. She denies unexplained fatigue or unexplained weight loss, bleeding, rash, or fever. A detailed review of systems was otherwise stable.    RIGHT BREAST CANCER HISTORY: From the original intake note:   Debra Henderson underwent screening mammography in December 2013. Calcifications were noted in the right breast, requiring further evaluation.  Subsequently, a stereotactic right breast biopsy was obtained on 02/13/2012 which confirmed ductal carcinoma in  situ with calcifications, low grade, ER and PR positive, both at 100%. [phone])  The patient underwent a right lumpectomy under the care of Dr. Marlou Starks on 04/22/2012. (Surgery have been delayed only slightly secondary to the patient's cardiac history and the requirements of restaging of the left common iliac artery prior to surgery.) Final pathology showed a low-grade ductal carcinoma in situ with a 0.1 cm focus of disease, and close to the posterior margin at 0.1 cm.  Atypical lobular hyperplasia was also noted. PTis, pNX. [phone]).  The patient was treated with adjuvant radiation therapy under the care of Dr. Pablo Ledger, completed 08/23/2012.  Her subsequent history is as detailed below.      PAST MEDICAL HISTORY: Past Medical History:  Diagnosis Date  . Asthma   . Breast cancer (Debra Henderson) 02/13/12   right upper outer- DCIS, ER/PR+  . Cancer [facility]) 2014   breast cancer  . Chronic kidney disease    renal artery stenosis  . Claudication [facility])    lower extremities  . Complication of anesthesia    ' It does not work " I am difficult to put tpo sleep  . Family history of anesthesia complication    my brother is difficult to put to sleep also  . GERD (gastroesophageal reflux disease)    otc  . History of renal stent    LEA DUPLEX, 03/16/2012 - LEFT EIA DISTAL/COMMON FEMORAL ARTERY-demonstrated occlusive disease  . Hyperlipidemia   . Hypertension   . Incontinence   . Peripheral vascular disease ([place])    prior stenting of left iliac artery  . Personal history of radiation therapy   . Radiation 08/02/12-08/23/12   Right breast 42.72 Gy x 16 fx  .  Renal artery stenosis ([facility])    RENAL DOPPLER, 02/12/2011 - RIGHT RENAL ARTERY 60-99% diameter reduction, LEFT RENAL ARTERY AT STENT 60-99% diameter reduction    PAST SURGICAL HISTORY: Past Surgical History:  Procedure Laterality Date  . ANGIOPLASTY ILLIAC ARTERY  02/24/2012   Dr Gwenlyn Found  L iliac restent  . ATHERECTOMY N/A 02/24/2012   Procedure:  ATHERECTOMY;  Surgeon: Lorretta Harp, MD;  Location: [facility] CATH LAB;  Service: Cardiovascular;  Laterality: N/A;  . BLADDER SUSPENSION  1980's  . bladder tack    . BREAST BIOPSY Right 05/2013  . BREAST BIOPSY Right 02/13/2012  . BREAST LUMPECTOMY Right 05/02/2012  . BREAST LUMPECTOMY WITH NEEDLE LOCALIZATION Right 04/22/2012   Procedure: RIGHT BREAST WIRE LOCALIZATION  LUMPECTOMY ;  Surgeon: Merrie Roof, MD;  Location: [place];  Service: General;  Laterality: Right;  . BREAST LUMPECTOMY WITH RADIOACTIVE SEED LOCALIZATION Left 02/06/2017   Procedure: BREAST LUMPECTOMY WITH RADIOACTIVE SEED LOCALIZATION;  Surgeon: Jovita Kussmaul, MD;  Location: [place];  Service: General;  Laterality: Left;  . fibroid breast     left breast-adenoma benign 1980's  . ILIAC ARTERY STENT  11/21/19/02   left   . NM MYOVIEW LTD  03/21/2010   normal myocardial perfusion study  . PERIPHERAL VASCULAR CATHETERIZATION Left 02/24/2012   Common iliac artery, 8x38 iCast Stent, resulting in a reduction from 80% in-stent restenosis to 0% residual  . PERIPHERAL VASCULAR CATHETERIZATION Left 08/04/2006   Renal 90% stenosis, 3.5x 13 drug-eluting stent resulting in a reduction of 90% in-stent restenosis to 0% residual  . PERIPHERAL VASCULAR CATHETERIZATION Left 05/22/2005   Renal 80% in-stent stenosis, 5x15 Aviator resulting in a reduction of 80% in-stent restenosis to 0% residual  . PERIPHERAL VASCULAR CATHETERIZATION Left 02/22/2002   Renal in-stent restenosis, 5x15 Guidant rapid-exchange balloon resulting in reduction of a 95% in-stent restenosis to less than 20% residual  . PERIPHERAL VASCULAR CATHETERIZATION Left 12/10/2000   95% renal stenosis, 6x57mm Genesis Aviator balloon/stent deployed at 10 atmospheres resulting in a reduction  of 95% stenosis to 0% residual; Common iliac artery stenosis, P12x4 mounted on a 7x2 Powerflex balloon resulting in a reduction of 80% to 0% residual  . PERIPHERAL VASCULAR  CATHETERIZATION Left 07/27/2000   95% renal stenosis, 7mm x 6cm Smart stent resulting in a reduction of 90-95%  to 0% residual  . stent /kidney  12/10/00,08/04/06   2002 R renal artery, 2008 L renal  . TUBAL LIGATION     1980's    FAMILY HISTORY (reviewed 06/07/2013) Family History  Problem Relation Age of Onset  . Cancer Mother        colon  . COPD Mother   . Cancer Father        brain  . Stroke Sister   . Heart attack Sister   Mother died at the age of 79. Father died at the age of 6. She has one older brother who has Crohn's disease. She has a younger sister who has multiple health issues secondary to having been a premature infant.  There is no known history of breast or GYN cancer in the family.  GYNECOLOGIC HISTORY:   (Reviewed 06/07/2013) [place] P2;  she took birth control pills for approximately 2 years in her 19s with good tolerance. She went through menopause naturally, approximately age 46.  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 Her husband, Jori Moll, is retired.  Both have significant chronic medical problems.  They have 2  daughters, one who lives locally, another in [place]. She has 3 grandchildren, ages 73, 14, and 51.    ADVANCED DIRECTIVES: Not in place   HEALTH MAINTENANCE:  (Reviewed 06/07/2013) Social History   Tobacco Use  . Smoking status: Former Smoker    Packs/day: 1.00    Types: Cigarettes    Last attempt to quit: 02/24/1988    Years since quitting: 29.5  . Smokeless tobacco: Never Used  . Tobacco comment: quit 1990  Substance Use Topics  . Alcohol use: No  . Drug use: No     Colonoscopy:  03/04/2017/ polyp removal/ Dr. Havery Moros  PAP:   Bone density: 07/15/2017 showed a T-score of -2.7  Lipid panel:   Allergies  Allergen Reactions  . Tetracyclines & Related Nausea And Vomiting and Other (See Comments)    Vomiting blood  . Vytorin [Ezetimibe-Simvastatin] Other (See Comments)    Per Dr Adora Fridge note  . Chicken  Allergy Diarrhea and Rash  . Eggs Or Egg-Derived Products Diarrhea and Rash  . Erythromycin Rash  . Lipitor [Atorvastatin] Rash and Other (See Comments)    Myalgias   . Penicillins Swelling and Rash  . Sulfa Antibiotics Swelling and Rash    Current Outpatient Medications  Medication Sig Dispense Refill  . anastrozole (ARIMIDEX) 1 MG tablet Take 1 tablet (1 mg total) by mouth daily. (Patient not taking: Reported on 06/01/2017) 90 tablet 4  . aspirin 81 MG tablet Take 81 mg by mouth daily.    . clopidogrel (PLAVIX) 75 MG tablet TAKE1 TABLET EVERY DAY  3  . losartan-hydrochlorothiazide (HYZAAR) 50-12.5 MG per tablet TAKE 1 TABLET BY MOUTH EVERY DAY (Patient not taking: Reported on 06/01/2017) 30 tablet 0  . omega-3 acid ethyl esters (LOVAZA) 1 G capsule Take 1 g by mouth 2 (two) times daily.    . OXYBUTYNIN CHLORIDE PO Take by mouth.     No current facility-administered medications for this visit.     OBJECTIVE: Middle-aged white Henderson who appears stated age  36:   09/14/17 1414  BP: 133/64  Pulse: 81  Resp: 18  Temp: 97.7 F (36.5 C)  SpO2: 100%     Body mass index is 25.68 kg/m.    ECOG FS: 1 Filed Weights   09/14/17 1414  Weight: 131 lb 8 oz (59.6 kg)   Sclerae unicteric, pupils round and equal No cervical or supraclavicular adenopathy Lungs no rales or rhonchi Heart regular rate and rhythm Abd soft, nontender, positive bowel sounds MSK no focal spinal tenderness, no upper extremity lymphedema Neuro: nonfocal, well oriented, appropriate affect Breasts: The right breast is status post remote lumpectomy with no evidence of disease recurrence.  The left breast is status post lumpectomy and radiation.  There is still mild hyperpigmentation.  There are no palpable masses.  There are no skin or nipple changes of concern.  Both axillae are benign.  LAB RESULTS:  CMP    Lab Results  Component Value Date   WBC 9.1 02/03/2017   NEUTROABS 5.4 02/03/2017   HGB 11.4 (L)  02/03/2017   HCT 35.5 (L) 02/03/2017   MCV 95.9 02/03/2017   PLT 441 (H) 02/03/2017      Chemistry      Component Value Date/Time   NA 131 (L) 02/03/2017 0952   NA 138 10/03/2013 1505   K 3.2 (L) 02/03/2017 0952   K 3.2 (L) 10/03/2013 1505   CL 95 (L) 02/03/2017 0952   CL 100 05/20/2012 1454  CO2 24 02/03/2017 0952   CO2 27 10/03/2013 1505   BUN 15 02/03/2017 0952   BUN 7.2 10/03/2013 1505   CREATININE 0.88 02/03/2017 0952   CREATININE 0.8 10/03/2013 1505      Component Value Date/Time   CALCIUM 9.2 02/03/2017 0952   CALCIUM 9.5 10/03/2013 1505   ALKPHOS 68 02/03/2017 0952   ALKPHOS 76 10/03/2013 1505   AST 13 (L) 02/03/2017 0952   AST 11 10/03/2013 1505   ALT 10 (L) 02/03/2017 0952   ALT 9 10/03/2013 1505   BILITOT 0.4 02/03/2017 0952   BILITOT 0.31 10/03/2013 1505       STUDIES: She will be due for mammography in November 2019.  ASSESSMENT: 72 y.o. Debra Henderson:  1)  status post  right lumpectomy April 2014 for a low-grade ductal carcinoma in situ. Focally close to the posterior margin at 0.1 cm. ER +100%, PR +100%.  2) Status post radiation therapy under the care of Dr. Pablo Ledger, completed 08/23/2012  3) decided against antiestrogens June 2015  4)  multiple comorbidities including peripheral vascular disease with stenting of the left iliac artery; hypertension; claudication; and a history of renal artery stenosis, with stenting of the left renal artery.  5) right breast upper inner quadrant biopsy 06/17/2013 showed a complex sclerosing lesion  6) status post left breast upper outer quadrant biopsy 12/25/2016 showing ductal carcinoma in situ, low-grade, estrogen and progesterone receptor positive.  7) left lumpectomy 02/06/2017 ductal carcinoma in situ, grade 1, measuring 1.0 cm, with negative margins  8) adjuvant radiation 04/02/17-04/30/17 Site/dose: 1) Left breast/ 40.05 Gy in 15 fractions 2) Left breast boost/ 12 Gy in 6 fractions  9) anastrozole  started 06/01/2017  (a) bone density 07/15/2017 showed a T score of -2.7  PLAN: I spent approximately 30 minutes face to face with Debra Henderson with more than 50% of that time spent in counseling and coordination of care. Debra Henderson is tolerating anastrozole remarkably well and the plan will be to continue that a total of 5 years.  She does have osteoporosis, with a T score just under -2.5.  We discussed various options today.  She is already on vitamin D supplementation.  However she is not going to be able to do much weightbearing exercise because of claudication issues.  I think she would benefit from bisphosphonates and specifically zolendronate, which she could receive once a year when she sees me.  She does have poor dentition and she will be discussing that with her dentist Dr. Jannifer Franklin who she will be seeing shortly.  She would benefit from physical therapy to regain full range of motion of both her upper extremities.  This has been ordered.  Otherwise she will have mammography in November and she will see me again in early December.  If she decides to go ahead with the Z[place] she will have at that day.  I expect we will start seeing her on a once a year basis from that point.   Magrinat, Virgie Dad, MD  09/14/17 2:21 PM Medical Oncology and Hematology [facility] [address] [place], [place] [address] Tel. [phone]    Fax. [phone]  Alice Rieger, am acting as scribe for Chauncey Cruel MD.  I, Lurline Del MD, have reviewed the above documentation for accuracy and completeness, and I agree with the above.

## 2017-09-14 ENCOUNTER — Inpatient Hospital Stay: Payer: PPO | Attending: Oncology | Admitting: Oncology

## 2017-09-14 VITALS — BP 133/64 | HR 81 | Temp 97.7°F | Resp 18 | Ht 60.0 in | Wt 131.5 lb

## 2017-09-14 DIAGNOSIS — I739 Peripheral vascular disease, unspecified: Secondary | ICD-10-CM | POA: Diagnosis not present

## 2017-09-14 DIAGNOSIS — Z87891 Personal history of nicotine dependence: Secondary | ICD-10-CM | POA: Diagnosis not present

## 2017-09-14 DIAGNOSIS — Z923 Personal history of irradiation: Secondary | ICD-10-CM | POA: Diagnosis not present

## 2017-09-14 DIAGNOSIS — Z86000 Personal history of in-situ neoplasm of breast: Secondary | ICD-10-CM | POA: Diagnosis not present

## 2017-09-14 DIAGNOSIS — M810 Age-related osteoporosis without current pathological fracture: Secondary | ICD-10-CM | POA: Insufficient documentation

## 2017-09-14 DIAGNOSIS — D0512 Intraductal carcinoma in situ of left breast: Secondary | ICD-10-CM | POA: Diagnosis not present

## 2017-09-14 DIAGNOSIS — I119 Hypertensive heart disease without heart failure: Secondary | ICD-10-CM | POA: Diagnosis not present

## 2017-09-14 DIAGNOSIS — D0511 Intraductal carcinoma in situ of right breast: Secondary | ICD-10-CM

## 2017-09-14 DIAGNOSIS — M818 Other osteoporosis without current pathological fracture: Secondary | ICD-10-CM

## 2017-09-15 ENCOUNTER — Other Ambulatory Visit: Payer: Self-pay | Admitting: Oncology

## 2017-09-15 DIAGNOSIS — D0512 Intraductal carcinoma in situ of left breast: Secondary | ICD-10-CM

## 2017-12-21 ENCOUNTER — Ambulatory Visit
Admission: RE | Admit: 2017-12-21 | Discharge: 2017-12-21 | Disposition: A | Discharge status: Home / Self Care | Payer: PPO | Source: Ambulatory Visit | Attending: Oncology | Admitting: Oncology

## 2017-12-21 DIAGNOSIS — D0512 Intraductal carcinoma in situ of left breast: Secondary | ICD-10-CM

## 2017-12-21 DIAGNOSIS — D0511 Intraductal carcinoma in situ of right breast: Secondary | ICD-10-CM

## 2017-12-21 DIAGNOSIS — Z853 Personal history of malignant neoplasm of breast: Secondary | ICD-10-CM | POA: Diagnosis not present

## 2017-12-21 DIAGNOSIS — R928 Other abnormal and inconclusive findings on diagnostic imaging of breast: Secondary | ICD-10-CM | POA: Diagnosis not present

## 2017-12-21 DIAGNOSIS — M818 Other osteoporosis without current pathological fracture: Secondary | ICD-10-CM

## 2017-12-22 NOTE — Progress Notes (Signed)
[place]  [phone]) [phone] Fax:(336) [phone]     ID: ZARIANA STRUB OB: 07/07/1945  MR#: [id number]  KXF#:[id number]  Patient Care Team: [name], MD as PCP - General (Internal Medicine) Jovita Kussmaul, MD as Consulting Physician (General Surgery) Magrinat, Virgie Dad, MD as Consulting Physician (Oncology) Gery Pray, MD as Consulting Physician (Radiation Oncology)    CHIEF COMPLAINT:  Hx of Right Breast ductal carcinoma in situ; new left breast DCIS  CURRENT TREATMENT: anastrozole; zolendronate/Reclast  INTERVAL HISTORY: Leonia returns today for follow-up of her remote right breast noninvasive cancer.   The patient continues on anastrozole, which she is tolerating well. She is not having hot flashes.   Her last bone density screening on 07/15/2017, showed a T-score of -2.7, which is considered osteoporotic.   Since her last visit, she underwent a digital diagnostic bilateral mammogram with tomography on 12/21/2017, showing: Breast Density Category B. There are stable post lumpectomy changes on the right. There are interval post lumpectomy changes and postradiation changes on the left. There are no interval findings suspicious for malignancy in either breast.   REVIEW OF SYSTEMS: Jelisha is doing well overall. For Thanksgiving, she went shopping. Her family is doing well. The patient denies unusual headaches, visual changes, nausea, vomiting, or dizziness. There has been no unusual cough, phlegm production, or pleurisy. This been no change in bowel or bladder habits. The patient denies unexplained fatigue or unexplained weight loss, bleeding, rash, or fever. A detailed review of systems was otherwise noncontributory.    RIGHT BREAST CANCER HISTORY: From the original intake note:   Rielynn underwent screening mammography in December 2013. Calcifications were noted in the right breast, requiring further evaluation.  Subsequently, a stereotactic  right breast biopsy was obtained on 02/13/2012 which confirmed ductal carcinoma in situ with calcifications, low grade, ER and PR positive, both at 100%. [phone])  The patient underwent a right lumpectomy under the care of Dr. Marlou Starks on 04/22/2012. (Surgery have been delayed only slightly secondary to the patient's cardiac history and the requirements of restaging of the left common iliac artery prior to surgery.) Final pathology showed a low-grade ductal carcinoma in situ with a 0.1 cm focus of disease, and close to the posterior margin at 0.1 cm.  Atypical lobular hyperplasia was also noted. PTis, pNX. [id number]).  The patient was treated with adjuvant radiation therapy under the care of Dr. Pablo Ledger, completed 08/23/2012.  Her subsequent history is as detailed below.      PAST MEDICAL HISTORY: Past Medical History:  Diagnosis Date  . Asthma   . Breast cancer ([place]) 02/13/12   right upper outer- DCIS, ER/PR+  . Cancer [facility]) 2014   breast cancer  . Chronic kidney disease    renal artery stenosis  . Claudication [facility])    lower extremities  . Complication of anesthesia    ' It does not work " I am difficult to put tpo sleep  . Family history of anesthesia complication    my brother is difficult to put to sleep also  . GERD (gastroesophageal reflux disease)    otc  . History of renal stent    LEA DUPLEX, 03/16/2012 - LEFT EIA DISTAL/COMMON FEMORAL ARTERY-demonstrated occlusive disease  . Hyperlipidemia   . Hypertension   . Incontinence   . Peripheral vascular disease ([place])    prior stenting of left iliac artery  . Personal history of radiation therapy 2014   right breast  . Personal history of radiation therapy 2019  left breast  . Radiation 08/02/12-08/23/12   Right breast 42.72 Gy x 16 fx  . Renal artery stenosis ([facility])    RENAL DOPPLER, 02/12/2011 - RIGHT RENAL ARTERY 60-99% diameter reduction, LEFT RENAL ARTERY AT STENT 60-99% diameter reduction    PAST SURGICAL  HISTORY: Past Surgical History:  Procedure Laterality Date  . ANGIOPLASTY ILLIAC ARTERY  02/24/2012   Dr Gwenlyn Found  L iliac restent  . ATHERECTOMY N/A 02/24/2012   Procedure: ATHERECTOMY;  Surgeon: Lorretta Harp, MD;  Location: [facility] CATH LAB;  Service: Cardiovascular;  Laterality: N/A;  . BLADDER SUSPENSION  1980's  . bladder tack    . BREAST BIOPSY Right 05/2013  . BREAST BIOPSY Right 02/13/2012  . BREAST BIOPSY Left 12/25/2016  . BREAST LUMPECTOMY Right 05/02/2012  . BREAST LUMPECTOMY Left 02/06/2017  . BREAST LUMPECTOMY WITH NEEDLE LOCALIZATION Right 04/22/2012   Procedure: RIGHT BREAST WIRE LOCALIZATION  LUMPECTOMY ;  Surgeon: Merrie Roof, MD;  Location: [place];  Service: General;  Laterality: Right;  . BREAST LUMPECTOMY WITH RADIOACTIVE SEED LOCALIZATION Left 02/06/2017   Procedure: BREAST LUMPECTOMY WITH RADIOACTIVE SEED LOCALIZATION;  Surgeon: Jovita Kussmaul, MD;  Location: [place];  Service: General;  Laterality: Left;  . fibroid breast     left breast-adenoma benign 1980's  . ILIAC ARTERY STENT  11/21/19/02   left   . NM MYOVIEW LTD  03/21/2010   normal myocardial perfusion study  . PERIPHERAL VASCULAR CATHETERIZATION Left 02/24/2012   Common iliac artery, 8x38 iCast Stent, resulting in a reduction from 80% in-stent restenosis to 0% residual  . PERIPHERAL VASCULAR CATHETERIZATION Left 08/04/2006   Renal 90% stenosis, 3.5x 13 drug-eluting stent resulting in a reduction of 90% in-stent restenosis to 0% residual  . PERIPHERAL VASCULAR CATHETERIZATION Left 05/22/2005   Renal 80% in-stent stenosis, 5x15 Aviator resulting in a reduction of 80% in-stent restenosis to 0% residual  . PERIPHERAL VASCULAR CATHETERIZATION Left 02/22/2002   Renal in-stent restenosis, 5x15 Guidant rapid-exchange balloon resulting in reduction of a 95% in-stent restenosis to less than 20% residual  . PERIPHERAL VASCULAR CATHETERIZATION Left 12/10/2000   95% renal stenosis, 6x33mm Genesis Aviator  balloon/stent deployed at 10 atmospheres resulting in a reduction  of 95% stenosis to 0% residual; Common iliac artery stenosis, P12x4 mounted on a 7x2 Powerflex balloon resulting in a reduction of 80% to 0% residual  . PERIPHERAL VASCULAR CATHETERIZATION Left 07/27/2000   95% renal stenosis, 29mm x 6cm Smart stent resulting in a reduction of 90-95%  to 0% residual  . stent /kidney  12/10/00,08/04/06   2002 R renal artery, 2008 L renal  . TUBAL LIGATION     1980's    FAMILY HISTORY (reviewed 06/07/2013) Family History  Problem Relation Age of Onset  . Cancer Mother        colon  . COPD Mother   . Cancer Father        brain  . Stroke Sister   . Heart attack Sister    Mother died at the age of 33. Father died at the age of 17. She has one older brother who has Crohn's disease. She has a younger sister who has multiple health issues secondary to having been a premature infant.  There is no known history of breast or GYN cancer in the family.  GYNECOLOGIC HISTORY:   (Reviewed 06/07/2013) [place] P2;  she took birth control pills for approximately 2 years in her 70s with good tolerance. She went through menopause naturally,  approximately age 7.  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 Her husband, Jori Moll, is retired.  Both have significant chronic medical problems.  They have 2 daughters, one who lives locally, another in [place]. She has 3 grandchildren, ages 77, 40, and 29.    ADVANCED DIRECTIVES: Not in place   HEALTH MAINTENANCE:  (Reviewed 06/07/2013) Social History   Tobacco Use  . Smoking status: Former Smoker    Packs/day: 1.00    Types: Cigarettes    Last attempt to quit: 02/24/1988    Years since quitting: 29.8  . Smokeless tobacco: Never Used  . Tobacco comment: quit 1990  Substance Use Topics  . Alcohol use: No  . Drug use: No     Colonoscopy:  03/04/2017/ polyp removal/ Dr. Havery Moros  PAP:   Bone density: 07/15/2017 showed a T-score  of -2.7  Lipid panel:   Allergies  Allergen Reactions  . Tetracyclines & Related Nausea And Vomiting and Other (See Comments)    Vomiting blood  . Vytorin [Ezetimibe-Simvastatin] Other (See Comments)    Per Dr Adora Fridge note  . Chicken Allergy Diarrhea and Rash  . Eggs Or Egg-Derived Products Diarrhea and Rash  . Erythromycin Rash  . Lipitor [Atorvastatin] Rash and Other (See Comments)    Myalgias   . Penicillins Swelling and Rash  . Sulfa Antibiotics Swelling and Rash    Current Outpatient Medications  Medication Sig Dispense Refill  . anastrozole (ARIMIDEX) 1 MG tablet Take 1 tablet (1 mg total) by mouth daily. (Patient not taking: Reported on 06/01/2017) 90 tablet 4  . aspirin 81 MG tablet Take 81 mg by mouth daily.    . clopidogrel (PLAVIX) 75 MG tablet TAKE1 TABLET EVERY DAY  3  . losartan-hydrochlorothiazide (HYZAAR) 50-12.5 MG per tablet TAKE 1 TABLET BY MOUTH EVERY DAY (Patient not taking: Reported on 06/01/2017) 30 tablet 0  . omega-3 acid ethyl esters (LOVAZA) 1 G capsule Take 1 g by mouth 2 (two) times daily.    . OXYBUTYNIN CHLORIDE PO Take by mouth.     No current facility-administered medications for this visit.     OBJECTIVE: Middle-aged white woman in no acute distress Vitals:   12/23/17 1207  BP: (!) 123/58  Pulse: 85  Resp: 18  Temp: 98 F (36.7 C)  SpO2: 98%     Body mass index is 25.9 kg/m.    ECOG FS: 1 Filed Weights   12/23/17 1207  Weight: 132 lb 9.6 oz (60.1 kg)   Sclerae unicteric, EOMs intact No cervical or supraclavicular adenopathy Lungs no rales or rhonchi Heart regular rate and rhythm Abd soft, nontender, positive bowel sounds MSK no focal spinal tenderness, no upper extremity lymphedema Neuro: nonfocal, well oriented, appropriate affect Breasts: The right breast has undergone lumpectomy remotely.  There is no evidence of disease recurrence.  The left breast is status post more recent lumpectomy and radiation.  There is no evidence of  disease recurrence.  Both axillae are benign.  LAB RESULTS:  CMP    Lab Results  Component Value Date   WBC 7.7 12/23/2017   NEUTROABS 5.5 12/23/2017   HGB 11.6 (L) 12/23/2017   HCT 34.1 (L) 12/23/2017   MCV 97.7 12/23/2017   PLT 317 12/23/2017      Chemistry      Component Value Date/Time   NA 131 (L) 02/03/2017 0952   NA 138 10/03/2013 1505   K 3.2 (L) 02/03/2017 0952   K 3.2 (L) 10/03/2013  1505   CL 95 (L) 02/03/2017 0952   CL 100 05/20/2012 1454   CO2 24 02/03/2017 0952   CO2 27 10/03/2013 1505   BUN 15 02/03/2017 0952   BUN 7.2 10/03/2013 1505   CREATININE 0.88 02/03/2017 0952   CREATININE 0.8 10/03/2013 1505      Component Value Date/Time   CALCIUM 9.2 02/03/2017 0952   CALCIUM 9.5 10/03/2013 1505   ALKPHOS 68 02/03/2017 0952   ALKPHOS 76 10/03/2013 1505   AST 13 (L) 02/03/2017 0952   AST 11 10/03/2013 1505   ALT 10 (L) 02/03/2017 0952   ALT 9 10/03/2013 1505   BILITOT 0.4 02/03/2017 0952   BILITOT 0.31 10/03/2013 1505       STUDIES: Mm Diag Breast Tomo Bilateral  Result Date: 12/21/2017 CLINICAL DATA:  Status post right lumpectomy and radiation therapy for breast cancer in 2014 and left lumpectomy and radiation therapy for breast cancer January 2019. EXAM: DIGITAL DIAGNOSTIC BILATERAL MAMMOGRAM WITH CAD AND TOMO COMPARISON:  Previous exam(s). ACR Breast Density Category b: There are scattered areas of fibroglandular density. FINDINGS: Stable post lumpectomy changes on the right. Interval post lumpectomy and postradiation changes on the left. No interval findings suspicious for malignancy in either breast. Mammographic images were processed with CAD. IMPRESSION: No evidence of malignancy. RECOMMENDATION: Bilateral diagnostic mammogram in 1 year. I have discussed the findings and recommendations with the patient. Results were also provided in writing at the conclusion of the visit. If applicable, a reminder letter will be sent to the patient regarding the next  appointment. BI-RADS CATEGORY  2: Benign. Electronically Signed   By: Claudie Revering M.D.   On: 12/21/2017 15:21    ASSESSMENT: 72 y.o. [place] woman:  1)  status post  right lumpectomy April 2014 for a low-grade ductal carcinoma in situ. Focally close to the posterior margin at 0.1 cm. ER +100%, PR +100%.  2) Status post radiation therapy under the care of Dr. Pablo Ledger, completed 08/23/2012  3) decided against antiestrogens June 2015  4)  multiple comorbidities including peripheral vascular disease with stenting of the left iliac artery; hypertension; claudication; and a history of renal artery stenosis, with stenting of the left renal artery.  5) right breast upper inner quadrant biopsy 06/17/2013 showed a complex sclerosing lesion  6) status post left breast upper outer quadrant biopsy 12/25/2016 showing ductal carcinoma in situ, low-grade, estrogen and progesterone receptor positive.  7) left lumpectomy 02/06/2017 ductal carcinoma in situ, grade 1, measuring 1.0 cm, with negative margins  8) adjuvant radiation 04/02/17-04/30/17 Site/dose: 1) Left breast/ 40.05 Gy in 15 fractions 2) Left breast boost/ 12 Gy in 6 fractions  9) anastrozole started 06/01/2017  (a) bone density 07/15/2017 showed a T score of -2.7  (b) zolendronate started 12/24/2017  PLAN: Ms. Christoper Fabian is now almost a year out from definitive surgery for her left breast cancer with no evidence of disease activity.  This is very favorable.  She is tolerating anastrozole with no side effects and the plan is to continue that for a total of 5 years.  She does have osteoporosis.  We again discussed the possible side effects toxicities and complications of zoledronate, including some aches and pains in her bones for a couple of days, possibly low calcium symptoms, and the rare cases of osteonecrosis of the jaw.  She tells me she is not planning to have any teeth pulled at any time and she does keep up with her  dentist.  Accordingly we are  starting zolendronate today.  We will repeat it a year from now when she returns to see me.  She will have a bone density 2 years from the last 1 and we hope to see some improvement at that point  She knows to call for any other issues that may develop before the next visit.  Magrinat, Virgie Dad, MD  12/23/17 12:21 PM Medical Oncology and Hematology [facility] [address] [place], [place] [address] Tel. [phone]    Fax. [phone]   I, Jacqualyn Posey am acting as a Education administrator for Chauncey Cruel, MD.   I, Lurline Del MD, have reviewed the above documentation for accuracy and completeness, and I agree with the above.

## 2017-12-23 ENCOUNTER — Inpatient Hospital Stay: Payer: PPO

## 2017-12-23 ENCOUNTER — Inpatient Hospital Stay: Payer: PPO | Attending: Oncology | Admitting: Oncology

## 2017-12-23 ENCOUNTER — Telehealth: Payer: Self-pay | Admitting: Oncology

## 2017-12-23 VITALS — BP 123/58 | HR 85 | Temp 98.0°F | Resp 18 | Ht 60.0 in | Wt 132.6 lb

## 2017-12-23 DIAGNOSIS — D0511 Intraductal carcinoma in situ of right breast: Secondary | ICD-10-CM

## 2017-12-23 DIAGNOSIS — I119 Hypertensive heart disease without heart failure: Secondary | ICD-10-CM

## 2017-12-23 DIAGNOSIS — D0512 Intraductal carcinoma in situ of left breast: Secondary | ICD-10-CM

## 2017-12-23 DIAGNOSIS — Z170 Estrogen receptor positive status [ER+]: Secondary | ICD-10-CM | POA: Diagnosis not present

## 2017-12-23 DIAGNOSIS — M810 Age-related osteoporosis without current pathological fracture: Secondary | ICD-10-CM | POA: Diagnosis not present

## 2017-12-23 DIAGNOSIS — I739 Peripheral vascular disease, unspecified: Secondary | ICD-10-CM | POA: Diagnosis not present

## 2017-12-23 DIAGNOSIS — M818 Other osteoporosis without current pathological fracture: Secondary | ICD-10-CM

## 2017-12-23 DIAGNOSIS — Z86000 Personal history of in-situ neoplasm of breast: Secondary | ICD-10-CM | POA: Diagnosis not present

## 2017-12-23 DIAGNOSIS — Z87891 Personal history of nicotine dependence: Secondary | ICD-10-CM | POA: Insufficient documentation

## 2017-12-23 LAB — COMPREHENSIVE METABOLIC PANEL
ALT: 6 U/L (ref 0–44)
AST: 9 U/L — ABNORMAL LOW (ref 15–41)
Albumin: 3.6 g/dL (ref 3.5–5.0)
Alkaline Phosphatase: 71 U/L (ref 38–126)
Anion gap: 11 (ref 5–15)
BUN: 10 mg/dL (ref 8–23)
CO2: 23 mmol/L (ref 22–32)
Calcium: 9.5 mg/dL (ref 8.9–10.3)
Chloride: 103 mmol/L (ref 98–111)
Creatinine, Ser: 0.81 mg/dL (ref 0.44–1.00)
GFR calc Af Amer: >60 mL/min (ref >60)
GFR calc non Af Amer: >60 mL/min (ref >60)
Glucose, Bld: 123 mg/dL — ABNORMAL HIGH (ref 70–99)
Potassium: 3.8 mmol/L (ref 3.5–5.1)
Sodium: 137 mmol/L (ref 135–145)
Total Bilirubin: <0.2 mg/dL — ABNORMAL LOW (ref 0.3–1.2)
Total Protein: 7.3 g/dL (ref 6.5–8.1)

## 2017-12-23 LAB — CBC WITH DIFFERENTIAL/PLATELET
Abs Immature Granulocytes: 0.04 10*3/uL (ref 0.00–0.07)
Basophils Absolute: 0 10*3/uL (ref 0.0–0.1)
Basophils Relative: 0 %
Eosinophils Absolute: 0.1 10*3/uL (ref 0.0–0.5)
Eosinophils Relative: 1 %
HCT: 34.1 % — ABNORMAL LOW (ref 36.0–46.0)
Hemoglobin: 11.6 g/dL — ABNORMAL LOW (ref 12.0–15.0)
Immature Granulocytes: 1 %
Lymphocytes Relative: 19 %
Lymphs Abs: 1.5 10*3/uL (ref 0.7–4.0)
MCH: 33.2 pg (ref 26.0–34.0)
MCHC: 34 g/dL (ref 30.0–36.0)
MCV: 97.7 fL (ref 80.0–100.0)
Monocytes Absolute: 0.6 10*3/uL (ref 0.1–1.0)
Monocytes Relative: 8 %
Neutro Abs: 5.5 10*3/uL (ref 1.7–7.7)
Neutrophils Relative %: 71 %
Platelets: 317 10*3/uL (ref 150–400)
RBC: 3.49 MIL/uL — ABNORMAL LOW (ref 3.87–5.11)
RDW: 13.2 % (ref 11.5–15.5)
WBC: 7.7 10*3/uL (ref 4.0–10.5)
nRBC: 0 % (ref 0.0–0.2)

## 2017-12-23 MED ORDER — ZOLEDRONIC ACID 4 MG/100ML IV SOLN
4.0000 mg | Freq: Once | INTRAVENOUS | Status: AC
  Administered 2017-12-23: 4 mg via INTRAVENOUS
  Filled 2017-12-23: qty 100

## 2017-12-23 MED ORDER — SODIUM CHLORIDE 0.9 % IV SOLN
INTRAVENOUS | Status: DC
  Administered 2017-12-23: 13:00:00 via INTRAVENOUS
  Filled 2017-12-23: qty 250

## 2017-12-23 NOTE — Patient Instructions (Signed)

## 2017-12-23 NOTE — Telephone Encounter (Signed)
Gave patient avs and calendar.   °

## 2018-06-09 DIAGNOSIS — R3129 Other microscopic hematuria: Secondary | ICD-10-CM | POA: Diagnosis not present

## 2018-06-09 DIAGNOSIS — N3281 Overactive bladder: Secondary | ICD-10-CM | POA: Diagnosis not present

## 2018-06-09 DIAGNOSIS — Z0001 Encounter for general adult medical examination with abnormal findings: Secondary | ICD-10-CM | POA: Diagnosis not present

## 2018-06-09 DIAGNOSIS — R32 Unspecified urinary incontinence: Secondary | ICD-10-CM | POA: Diagnosis not present

## 2018-06-09 DIAGNOSIS — Z681 Body mass index (BMI) 19 or less, adult: Secondary | ICD-10-CM | POA: Diagnosis not present

## 2018-06-09 DIAGNOSIS — Z1389 Encounter for screening for other disorder: Secondary | ICD-10-CM | POA: Diagnosis not present

## 2018-06-13 ENCOUNTER — Other Ambulatory Visit: Payer: Self-pay | Admitting: Oncology

## 2018-06-16 DIAGNOSIS — Z0000 Encounter for general adult medical examination without abnormal findings: Secondary | ICD-10-CM | POA: Diagnosis not present

## 2018-06-16 DIAGNOSIS — E7849 Other hyperlipidemia: Secondary | ICD-10-CM | POA: Diagnosis not present

## 2018-06-16 DIAGNOSIS — R7309 Other abnormal glucose: Secondary | ICD-10-CM | POA: Diagnosis not present

## 2018-12-22 NOTE — Progress Notes (Signed)
Debra Henderson  [phone] Fax:(336) [phone]     ID: Debra Henderson OB: 12/12/1945  MR#: [id number]  [id number]  Patient Care Team: [name], MD as PCP - General (Internal Medicine) Jovita Kussmaul, MD as Consulting Physician (General Surgery) Avanish Cerullo, Virgie Dad, MD as Consulting Physician (Oncology) Gery Pray, MD as Consulting Physician (Radiation Oncology)    CHIEF COMPLAINT:  Hx of Right Breast ductal carcinoma in situ; new left breast DCIS  CURRENT TREATMENT: anastrozole; zoledronate yearly   INTERVAL HISTORY: Debra Henderson returns today for follow-up of her remote right breast noninvasive cancer.   The patient continues on anastrozole.  She tolerates it well, with no hot flashes or other side effects that she is aware of.  She thinks the cost is more than $10 a month but she is not sure  Her last bone density screening on 07/15/2017, showed a T-score of -2.7, which is considered osteoporotic.   She was started on zolendronate at her last visit here on 12/23/2017.  She tolerated that well, with no aches or pains or symptoms of hypocalcemia.  Since her last visit, she has not undergone any additional studies. Her most recent mammography was on 12/21/2017 at [facility].   REVIEW OF SYSTEMS: Debra Henderson tells me mostly when she does this follow around her husband who has a Investment banker, corporate.  The defibrillator has been going off a lot and this is a great concern to both of them.  She tells me that he is not planning to disconnected but that if he ever has a stroke he would want to disconnected at that time.  Apparently his family has a history of strokes.  She herself is otherwise not exercising.  They are keeping appropriate pandemic precautions.  A detailed review of systems today was stable   RIGHT BREAST CANCER HISTORY: From the original intake note:   Debra Henderson underwent screening mammography in December 2013. Calcifications were  noted in the right breast, requiring further evaluation.  Subsequently, a stereotactic right breast biopsy was obtained on 02/13/2012 which confirmed ductal carcinoma in situ with calcifications, low grade, ER and PR positive, both at 100%. [phone])  The patient underwent a right lumpectomy under the care of Dr. Marlou Starks on 04/22/2012. (Surgery have been delayed only slightly secondary to the patient's cardiac history and the requirements of restaging of the left common iliac artery prior to surgery.) Final pathology showed a low-grade ductal carcinoma in situ with a 0.1 cm focus of disease, and close to the posterior margin at 0.1 cm.  Atypical lobular hyperplasia was also noted. PTis, pNX. [phone]).  The patient was treated with adjuvant radiation therapy under the care of Dr. Pablo Ledger, completed 08/23/2012.  Her subsequent history is as detailed below.     PAST MEDICAL HISTORY: Past Medical History:  Diagnosis Date  . Asthma   . Breast cancer ([place]) 02/13/12   right upper outer- DCIS, ER/PR+  . Cancer [facility]) 2014   breast cancer  . Chronic kidney disease    renal artery stenosis  . Claudication [facility])    lower extremities  . Complication of anesthesia    ' It does not work " I am difficult to put tpo sleep  . Family history of anesthesia complication    my brother is difficult to put to sleep also  . GERD (gastroesophageal reflux disease)    otc  . History of renal stent    LEA DUPLEX, 03/16/2012 - LEFT EIA DISTAL/COMMON FEMORAL ARTERY-demonstrated occlusive disease  .  Hyperlipidemia   . Hypertension   . Incontinence   . Peripheral vascular disease ([place])    prior stenting of left iliac artery  . Personal history of radiation therapy 2014   right breast  . Personal history of radiation therapy 2019   left breast  . Radiation 08/02/12-08/23/12   Right breast 42.72 Gy x 16 fx  . Renal artery stenosis ([facility])    RENAL DOPPLER, 02/12/2011 - RIGHT RENAL ARTERY 60-99% diameter  reduction, LEFT RENAL ARTERY AT STENT 60-99% diameter reduction    PAST SURGICAL HISTORY: Past Surgical History:  Procedure Laterality Date  . ANGIOPLASTY ILLIAC ARTERY  02/24/2012   Dr Gwenlyn Found  L iliac restent  . ATHERECTOMY N/A 02/24/2012   Procedure: ATHERECTOMY;  Surgeon: Lorretta Harp, MD;  Location: [facility] CATH LAB;  Service: Cardiovascular;  Laterality: N/A;  . BLADDER SUSPENSION  1980's  . bladder tack    . BREAST BIOPSY Right 05/2013  . BREAST BIOPSY Right 02/13/2012  . BREAST BIOPSY Left 12/25/2016  . BREAST LUMPECTOMY Right 05/02/2012  . BREAST LUMPECTOMY Left 02/06/2017  . BREAST LUMPECTOMY WITH NEEDLE LOCALIZATION Right 04/22/2012   Procedure: RIGHT BREAST WIRE LOCALIZATION  LUMPECTOMY ;  Surgeon: Merrie Roof, MD;  Location: [facility];  Service: General;  Laterality: Right;  . BREAST LUMPECTOMY WITH RADIOACTIVE SEED LOCALIZATION Left 02/06/2017   Procedure: BREAST LUMPECTOMY WITH RADIOACTIVE SEED LOCALIZATION;  Surgeon: Jovita Kussmaul, MD;  Location: [place];  Service: General;  Laterality: Left;  . fibroid breast     left breast-adenoma benign 1980's  . ILIAC ARTERY STENT  11/21/19/02   left   . NM MYOVIEW LTD  03/21/2010   normal myocardial perfusion study  . PERIPHERAL VASCULAR CATHETERIZATION Left 02/24/2012   Common iliac artery, 8x38 iCast Stent, resulting in a reduction from 80% in-stent restenosis to 0% residual  . PERIPHERAL VASCULAR CATHETERIZATION Left 08/04/2006   Renal 90% stenosis, 3.5x 13 drug-eluting stent resulting in a reduction of 90% in-stent restenosis to 0% residual  . PERIPHERAL VASCULAR CATHETERIZATION Left 05/22/2005   Renal 80% in-stent stenosis, 5x15 Aviator resulting in a reduction of 80% in-stent restenosis to 0% residual  . PERIPHERAL VASCULAR CATHETERIZATION Left 02/22/2002   Renal in-stent restenosis, 5x15 Guidant rapid-exchange balloon resulting in reduction of a 95% in-stent restenosis to less than 20% residual  . PERIPHERAL VASCULAR  CATHETERIZATION Left 12/10/2000   95% renal stenosis, 6x15mm Genesis Aviator balloon/stent deployed at 10 atmospheres resulting in a reduction  of 95% stenosis to 0% residual; Common iliac artery stenosis, P12x4 mounted on a 7x2 Powerflex balloon resulting in a reduction of 80% to 0% residual  . PERIPHERAL VASCULAR CATHETERIZATION Left 07/27/2000   95% renal stenosis, 40mm x 6cm Smart stent resulting in a reduction of 90-95%  to 0% residual  . stent /kidney  12/10/00,08/04/06   2002 R renal artery, 2008 L renal  . TUBAL LIGATION     1980's    FAMILY HISTORY (reviewed 06/07/2013) Family History  Problem Relation Age of Onset  . Cancer Mother        colon  . COPD Mother   . Cancer Father        brain  . Stroke Sister   . Heart attack Sister    Mother died at the age of 62. Father died at the age of 22. She has one older brother who has Crohn's disease. She has a younger sister who has multiple health issues secondary to having been  a premature infant.  There is no known history of breast or GYN cancer in the family.   GYNECOLOGIC HISTORY:   (Reviewed 06/07/2013) [place] P2;  she took birth control pills for approximately 2 years in her 15s with good tolerance. She went through menopause naturally, approximately age 47.   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 Her husband, Debra Henderson, is retired.  Both have significant chronic medical problems.  They have 2 daughters, one who lives locally, another in [place]. She has 3 grandchildren, ages 18, 75, and 39.    ADVANCED DIRECTIVES: Not in place   HEALTH MAINTENANCE:  Social History   Tobacco Use  . Smoking status: Former Smoker    Packs/day: 1.00    Types: Cigarettes    Quit date: 02/24/1988    Years since quitting: 30.8  . Smokeless tobacco: Never Used  . Tobacco comment: quit 1990  Substance Use Topics  . Alcohol use: No  . Drug use: No     Colonoscopy:  03/04/2017/ polyp removal/ Dr. Havery Moros   PAP:   Bone density: 07/15/2017 showed a T-score of -2.7  Lipid panel:   Allergies  Allergen Reactions  . Tetracyclines & Related Nausea And Vomiting and Other (See Comments)    Vomiting blood  . Vytorin [Ezetimibe-Simvastatin] Other (See Comments)    Per Dr Adora Fridge note  . Chicken Allergy Diarrhea and Rash  . Eggs Or Egg-Derived Products Diarrhea and Rash  . Erythromycin Rash  . Lipitor [Atorvastatin] Rash and Other (See Comments)    Myalgias   . Penicillins Swelling and Rash  . Sulfa Antibiotics Swelling and Rash    Current Outpatient Medications  Medication Sig Dispense Refill  . anastrozole (ARIMIDEX) 1 MG tablet TAKE 1 TABLET BY MOUTH EVERY DAY 90 tablet 2  . aspirin 81 MG tablet Take 81 mg by mouth daily.    . clopidogrel (PLAVIX) 75 MG tablet TAKE1 TABLET EVERY DAY  3  . losartan-hydrochlorothiazide (HYZAAR) 50-12.5 MG per tablet TAKE 1 TABLET BY MOUTH EVERY DAY (Patient not taking: Reported on 06/01/2017) 30 tablet 0  . omega-3 acid ethyl esters (LOVAZA) 1 G capsule Take 1 g by mouth 2 (two) times daily.    . OXYBUTYNIN CHLORIDE PO Take by mouth.     No current facility-administered medications for this visit.     OBJECTIVE: Middle-aged white woman who appears stated age 49:   12/23/18 1406  BP: (!) 135/49  Pulse: 95  Resp: 18  Temp: 98.7 F (37.1 C)  SpO2: 100%     Body mass index is 25.08 kg/m.    ECOG FS: 1 Filed Weights   12/23/18 1406  Weight: 128 lb 6.4 oz (58.2 kg)    Sclerae unicteric, EOMs intact Wearing a mask No cervical or supraclavicular adenopathy Lungs no rales or rhonchi Heart regular rate and rhythm Abd soft, nontender, positive bowel sounds MSK no focal spinal tenderness, no upper extremity lymphedema Neuro: nonfocal, well oriented, appropriate affect Breasts: The right breast is status post remote lumpectomy, with no evidence of disease recurrence.  The left breast is status post lumpectomy and radiation.  There is no evidence of  disease recurrence.  Both axillae are benign.  LAB RESULTS:  CMP   Lab Results  Component Value Date   WBC 6.6 12/23/2018   NEUTROABS 4.5 12/23/2018   HGB 11.5 (L) 12/23/2018   HCT 34.0 (L) 12/23/2018   MCV 93.4 12/23/2018   PLT 348 12/23/2018  Chemistry      Component Value Date/Time   NA 132 (L) 12/23/2018 1353   NA 138 10/03/2013 1505   K 3.3 (L) 12/23/2018 1353   K 3.2 (L) 10/03/2013 1505   CL 96 (L) 12/23/2018 1353   CL 100 05/20/2012 1454   CO2 24 12/23/2018 1353   CO2 27 10/03/2013 1505   BUN 9 12/23/2018 1353   BUN 7.2 10/03/2013 1505   CREATININE 0.82 12/23/2018 1353   CREATININE 0.8 10/03/2013 1505      Component Value Date/Time   CALCIUM 9.1 12/23/2018 1353   CALCIUM 9.5 10/03/2013 1505   ALKPHOS 66 12/23/2018 1353   ALKPHOS 76 10/03/2013 1505   AST 11 (L) 12/23/2018 1353   AST 11 10/03/2013 1505   ALT 8 12/23/2018 1353   ALT 9 10/03/2013 1505   BILITOT <0.2 (L) 12/23/2018 1353   BILITOT 0.31 10/03/2013 1505      STUDIES: No results found.    ASSESSMENT: 73 y.o. [place] woman:  1)  status post  right lumpectomy April 2014 for a low-grade ductal carcinoma in situ. Focally close to the posterior margin at 0.1 cm. ER +100%, PR +100%.  2) Status post radiation therapy under the care of Dr. Pablo Ledger, completed 08/23/2012  3) decided against antiestrogens June 2015  4)  multiple comorbidities including peripheral vascular disease with stenting of the left iliac artery; hypertension; claudication; and a history of renal artery stenosis, with stenting of the left renal artery.  5) right breast upper inner quadrant biopsy 06/17/2013 showed a complex sclerosing lesion  6) status post left breast upper outer quadrant biopsy 12/25/2016 showing ductal carcinoma in situ, low-grade, estrogen and progesterone receptor positive.  7) left lumpectomy 02/06/2017 ductal carcinoma in situ, grade 1, measuring 1.0 cm, with negative margins  8) adjuvant radiation  04/02/17-04/30/17 Site/dose: 1) Left breast/ 40.05 Gy in 15 fractions 2) Left breast boost/ 12 Gy in 6 fractions  9) anastrozole started 06/01/2017  (a) bone density 07/15/2017 showed a T score of -2.7  (b) zoledronate started 12/24/2017, repeated yearly   PLAN: Debra Henderson is tolerating the anastrozole well and the plan is to continue that to a total of 5 years.  She is now just about 2 years out from her left lumpectomy with no evidence of disease recurrence, which is very favorable  She did well with zoledronate last year and we are repeating that today.  I have asked her to take a little extra calcium to prevent hypocalcemia issues.  We will repeat her bone density in June 2021 and she will have her mammography at that time.  She would prefer to wait until then partly so as not to have to go twice and partly because by then she will have had her vaccine hopefully and there will be less concerned regarding the current pandemic  She knows to call for any other issues that may develop before her next visit here.  Debra Henderson, Virgie Dad, MD  12/23/18 2:37 PM Medical Oncology and Hematology [facility] [place], [place] [address] Tel. [phone]    Fax. [phone]   I, Wilburn Mylar, am acting as scribe for Dr. Virgie Dad. Debra Henderson.  I, Lurline Del MD, have reviewed the above documentation for accuracy and completeness, and I agree with the above.

## 2018-12-23 ENCOUNTER — Other Ambulatory Visit: Payer: Self-pay

## 2018-12-23 ENCOUNTER — Inpatient Hospital Stay (HOSPITAL_BASED_OUTPATIENT_CLINIC_OR_DEPARTMENT_OTHER): Payer: PPO | Admitting: Oncology

## 2018-12-23 ENCOUNTER — Inpatient Hospital Stay: Payer: PPO

## 2018-12-23 ENCOUNTER — Inpatient Hospital Stay: Payer: PPO | Attending: Oncology

## 2018-12-23 VITALS — BP 135/49 | HR 95 | Temp 98.7°F | Resp 18 | Ht 60.0 in | Wt 128.4 lb

## 2018-12-23 DIAGNOSIS — D0512 Intraductal carcinoma in situ of left breast: Secondary | ICD-10-CM

## 2018-12-23 DIAGNOSIS — M810 Age-related osteoporosis without current pathological fracture: Secondary | ICD-10-CM | POA: Diagnosis not present

## 2018-12-23 DIAGNOSIS — I739 Peripheral vascular disease, unspecified: Secondary | ICD-10-CM

## 2018-12-23 DIAGNOSIS — J410 Simple chronic bronchitis: Secondary | ICD-10-CM

## 2018-12-23 DIAGNOSIS — M818 Other osteoporosis without current pathological fracture: Secondary | ICD-10-CM

## 2018-12-23 DIAGNOSIS — D0511 Intraductal carcinoma in situ of right breast: Secondary | ICD-10-CM | POA: Diagnosis not present

## 2018-12-23 LAB — CBC WITH DIFFERENTIAL/PLATELET
Abs Immature Granulocytes: 0.03 10*3/uL (ref 0.00–0.07)
Basophils Absolute: 0 10*3/uL (ref 0.0–0.1)
Basophils Relative: 0 %
Eosinophils Absolute: 0 10*3/uL (ref 0.0–0.5)
Eosinophils Relative: 1 %
HCT: 34 % — ABNORMAL LOW (ref 36.0–46.0)
Hemoglobin: 11.5 g/dL — ABNORMAL LOW (ref 12.0–15.0)
Immature Granulocytes: 1 %
Lymphocytes Relative: 24 %
Lymphs Abs: 1.6 10*3/uL (ref 0.7–4.0)
MCH: 31.6 pg (ref 26.0–34.0)
MCHC: 33.8 g/dL (ref 30.0–36.0)
MCV: 93.4 fL (ref 80.0–100.0)
Monocytes Absolute: 0.5 10*3/uL (ref 0.1–1.0)
Monocytes Relative: 7 %
Neutro Abs: 4.5 10*3/uL (ref 1.7–7.7)
Neutrophils Relative %: 67 %
Platelets: 348 10*3/uL (ref 150–400)
RBC: 3.64 MIL/uL — ABNORMAL LOW (ref 3.87–5.11)
RDW: 13.7 % (ref 11.5–15.5)
WBC: 6.6 10*3/uL (ref 4.0–10.5)
nRBC: 0 % (ref 0.0–0.2)

## 2018-12-23 LAB — COMPREHENSIVE METABOLIC PANEL
ALT: 8 U/L (ref 0–44)
AST: 11 U/L — ABNORMAL LOW (ref 15–41)
Albumin: 3.6 g/dL (ref 3.5–5.0)
Alkaline Phosphatase: 66 U/L (ref 38–126)
Anion gap: 12 (ref 5–15)
BUN: 9 mg/dL (ref 8–23)
CO2: 24 mmol/L (ref 22–32)
Calcium: 9.1 mg/dL (ref 8.9–10.3)
Chloride: 96 mmol/L — ABNORMAL LOW (ref 98–111)
Creatinine, Ser: 0.82 mg/dL (ref 0.44–1.00)
GFR calc Af Amer: >60 mL/min (ref >60)
GFR calc non Af Amer: >60 mL/min (ref >60)
Glucose, Bld: 176 mg/dL — ABNORMAL HIGH (ref 70–99)
Potassium: 3.3 mmol/L — ABNORMAL LOW (ref 3.5–5.1)
Sodium: 132 mmol/L — ABNORMAL LOW (ref 135–145)
Total Bilirubin: <0.2 mg/dL — ABNORMAL LOW (ref 0.3–1.2)
Total Protein: 7 g/dL (ref 6.5–8.1)

## 2018-12-23 MED ORDER — ZOLEDRONIC ACID 4 MG/100ML IV SOLN
INTRAVENOUS | Status: AC
  Filled 2018-12-23: qty 100

## 2018-12-23 MED ORDER — ANASTROZOLE 1 MG PO TABS: 1.0000 mg | ORAL_TABLET | Freq: Every day | ORAL | 4 refills | Status: DC

## 2018-12-23 MED ORDER — SODIUM CHLORIDE 0.9 % IV SOLN
INTRAVENOUS | Status: DC
  Administered 2018-12-23: 15:00:00 via INTRAVENOUS
  Filled 2018-12-23: qty 250

## 2018-12-23 MED ORDER — ZOLEDRONIC ACID 4 MG/100ML IV SOLN
4.0000 mg | Freq: Once | INTRAVENOUS | Status: AC
  Administered 2018-12-23: 4 mg via INTRAVENOUS

## 2018-12-23 NOTE — Addendum Note (Signed)
Addended by: Chauncey Cruel on: 12/23/2018 02:55 PM   Modules accepted: Orders

## 2018-12-23 NOTE — Patient Instructions (Signed)
Zoledronic Acid injection (Hypercalcemia, Oncology) What is this medicine? ZOLEDRONIC ACID (ZOE le dron ik AS id) lowers the amount of calcium loss from bone. It is used to treat too much calcium in your blood from cancer. It is also used to prevent complications of cancer that has spread to the bone. This medicine may be used for other purposes; ask your health care provider or pharmacist if you have questions. COMMON BRAND NAME(S): Zometa What should I tell my health care provider before I take this medicine? They need to know if you have any of these conditions:  aspirin-sensitive asthma  cancer, especially if you are receiving medicines used to treat cancer  dental disease or wear dentures  infection  kidney disease  receiving corticosteroids like dexamethasone or prednisone  an unusual or allergic reaction to zoledronic acid, other medicines, foods, dyes, or preservatives  pregnant or trying to get pregnant  breast-feeding How should I use this medicine? This medicine is for infusion into a vein. It is given by a health care professional in a hospital or clinic setting. Talk to your pediatrician regarding the use of this medicine in children. Special care may be needed. Overdosage: If you think you have taken too much of this medicine contact a poison control center or emergency room at once. NOTE: This medicine is only for you. Do not share this medicine with others. What if I miss a dose? It is important not to miss your dose. Call your doctor or health care professional if you are unable to keep an appointment. What may interact with this medicine?  certain antibiotics given by injection  NSAIDs, medicines for pain and inflammation, like ibuprofen or naproxen  some diuretics like bumetanide, furosemide  teriparatide  thalidomide This list may not describe all possible interactions. Give your health care provider a list of all the medicines, herbs, non-prescription  drugs, or dietary supplements you use. Also tell them if you smoke, drink alcohol, or use illegal drugs. Some items may interact with your medicine. What should I watch for while using this medicine? Visit your doctor or health care professional for regular checkups. It may be some time before you see the benefit from this medicine. Do not stop taking your medicine unless your doctor tells you to. Your doctor may order blood tests or other tests to see how you are doing. Women should inform their doctor if they wish to become pregnant or think they might be pregnant. There is a potential for serious side effects to an unborn child. Talk to your health care professional or pharmacist for more information. You should make sure that you get enough calcium and vitamin D while you are taking this medicine. Discuss the foods you eat and the vitamins you take with your health care professional. Some people who take this medicine have severe bone, joint, and/or muscle pain. This medicine may also increase your risk for jaw problems or a broken thigh bone. Tell your doctor right away if you have severe pain in your jaw, bones, joints, or muscles. Tell your doctor if you have any pain that does not go away or that gets worse. Tell your dentist and dental surgeon that you are taking this medicine. You should not have major dental surgery while on this medicine. See your dentist to have a dental exam and fix any dental problems before starting this medicine. Take good care of your teeth while on this medicine. Make sure you see your dentist for regular follow-up   appointments. What side effects may I notice from receiving this medicine? Side effects that you should report to your doctor or health care professional as soon as possible:  allergic reactions like skin rash, itching or hives, swelling of the face, lips, or tongue  anxiety, confusion, or depression  breathing problems  changes in vision  eye  pain  feeling faint or lightheaded, falls  jaw pain, especially after dental work  mouth sores  muscle cramps, stiffness, or weakness  redness, blistering, peeling or loosening of the skin, including inside the mouth  trouble passing urine or change in the amount of urine Side effects that usually do not require medical attention (report to your doctor or health care professional if they continue or are bothersome):  bone, joint, or muscle pain  constipation  diarrhea  fever  hair loss  irritation at site where injected  loss of appetite  nausea, vomiting  stomach upset  trouble sleeping  trouble swallowing  weak or tired This list may not describe all possible side effects. Call your doctor for medical advice about side effects. You may report side effects to FDA at 1-800-FDA-1088. Where should I keep my medicine? This drug is given in a hospital or clinic and will not be stored at home. NOTE: This sheet is a summary. It may not cover all possible information. If you have questions about this medicine, talk to your doctor, pharmacist, or health care provider.  2020 Elsevier/Gold Standard (2013-06-04 14:19:39)  

## 2018-12-24 ENCOUNTER — Telehealth: Payer: Self-pay | Admitting: Oncology

## 2018-12-24 NOTE — Telephone Encounter (Signed)
I talk with patient regarding schedule  

## 2019-04-20 DIAGNOSIS — I700 Atherosclerosis of aorta: Secondary | ICD-10-CM | POA: Diagnosis not present

## 2019-04-20 DIAGNOSIS — I10 Essential (primary) hypertension: Secondary | ICD-10-CM | POA: Diagnosis not present

## 2019-04-20 DIAGNOSIS — Z87891 Personal history of nicotine dependence: Secondary | ICD-10-CM | POA: Diagnosis not present

## 2019-04-20 DIAGNOSIS — I701 Atherosclerosis of renal artery: Secondary | ICD-10-CM | POA: Diagnosis not present

## 2019-06-17 DIAGNOSIS — L2089 Other atopic dermatitis: Secondary | ICD-10-CM | POA: Diagnosis not present

## 2019-06-17 DIAGNOSIS — Z6824 Body mass index (BMI) 24.0-24.9, adult: Secondary | ICD-10-CM | POA: Diagnosis not present

## 2019-06-17 DIAGNOSIS — Z0001 Encounter for general adult medical examination with abnormal findings: Secondary | ICD-10-CM | POA: Diagnosis not present

## 2019-06-17 DIAGNOSIS — E782 Mixed hyperlipidemia: Secondary | ICD-10-CM | POA: Diagnosis not present

## 2019-06-17 DIAGNOSIS — I10 Essential (primary) hypertension: Secondary | ICD-10-CM | POA: Diagnosis not present

## 2019-06-17 DIAGNOSIS — Z1389 Encounter for screening for other disorder: Secondary | ICD-10-CM | POA: Diagnosis not present

## 2019-07-01 DIAGNOSIS — E871 Hypo-osmolality and hyponatremia: Secondary | ICD-10-CM | POA: Diagnosis not present

## 2019-07-20 DIAGNOSIS — I10 Essential (primary) hypertension: Secondary | ICD-10-CM | POA: Diagnosis not present

## 2019-07-20 DIAGNOSIS — I701 Atherosclerosis of renal artery: Secondary | ICD-10-CM | POA: Diagnosis not present

## 2019-07-20 DIAGNOSIS — E7849 Other hyperlipidemia: Secondary | ICD-10-CM | POA: Diagnosis not present

## 2019-07-20 DIAGNOSIS — I700 Atherosclerosis of aorta: Secondary | ICD-10-CM | POA: Diagnosis not present

## 2019-08-19 DIAGNOSIS — I700 Atherosclerosis of aorta: Secondary | ICD-10-CM | POA: Diagnosis not present

## 2019-08-19 DIAGNOSIS — I10 Essential (primary) hypertension: Secondary | ICD-10-CM | POA: Diagnosis not present

## 2019-08-19 DIAGNOSIS — E7849 Other hyperlipidemia: Secondary | ICD-10-CM | POA: Diagnosis not present

## 2019-08-19 DIAGNOSIS — I701 Atherosclerosis of renal artery: Secondary | ICD-10-CM | POA: Diagnosis not present

## 2019-09-23 ENCOUNTER — Ambulatory Visit
Admission: RE | Admit: 2019-09-23 | Discharge: 2019-09-23 | Disposition: A | Discharge status: Home / Self Care | Payer: PPO | Source: Ambulatory Visit | Attending: Oncology | Admitting: Oncology

## 2019-09-23 ENCOUNTER — Other Ambulatory Visit: Payer: Self-pay

## 2019-09-23 DIAGNOSIS — R928 Other abnormal and inconclusive findings on diagnostic imaging of breast: Secondary | ICD-10-CM | POA: Diagnosis not present

## 2019-09-23 DIAGNOSIS — D0511 Intraductal carcinoma in situ of right breast: Secondary | ICD-10-CM

## 2019-09-23 DIAGNOSIS — I739 Peripheral vascular disease, unspecified: Secondary | ICD-10-CM

## 2019-09-23 DIAGNOSIS — D0512 Intraductal carcinoma in situ of left breast: Secondary | ICD-10-CM

## 2019-09-23 DIAGNOSIS — J410 Simple chronic bronchitis: Secondary | ICD-10-CM

## 2019-09-23 DIAGNOSIS — Z853 Personal history of malignant neoplasm of breast: Secondary | ICD-10-CM | POA: Diagnosis not present

## 2019-11-24 DIAGNOSIS — Z01419 Encounter for gynecological examination (general) (routine) without abnormal findings: Secondary | ICD-10-CM | POA: Diagnosis not present

## 2019-11-24 DIAGNOSIS — Z124 Encounter for screening for malignant neoplasm of cervix: Secondary | ICD-10-CM | POA: Diagnosis not present

## 2019-12-01 ENCOUNTER — Ambulatory Visit
Admission: RE | Admit: 2019-12-01 | Discharge: 2019-12-01 | Disposition: A | Discharge status: Home / Self Care | Payer: PPO | Source: Ambulatory Visit | Attending: Oncology | Admitting: Oncology

## 2019-12-01 ENCOUNTER — Other Ambulatory Visit: Payer: Self-pay

## 2019-12-01 DIAGNOSIS — M810 Age-related osteoporosis without current pathological fracture: Secondary | ICD-10-CM | POA: Diagnosis not present

## 2019-12-01 DIAGNOSIS — I739 Peripheral vascular disease, unspecified: Secondary | ICD-10-CM

## 2019-12-01 DIAGNOSIS — D0511 Intraductal carcinoma in situ of right breast: Secondary | ICD-10-CM

## 2019-12-01 DIAGNOSIS — Z780 Asymptomatic menopausal state: Secondary | ICD-10-CM | POA: Diagnosis not present

## 2019-12-01 DIAGNOSIS — J410 Simple chronic bronchitis: Secondary | ICD-10-CM

## 2019-12-01 DIAGNOSIS — D0512 Intraductal carcinoma in situ of left breast: Secondary | ICD-10-CM

## 2019-12-25 NOTE — Progress Notes (Signed)
[place]  [phone] [phone]     ID: Debra Henderson OB: 12/09/1945  MR#: [id number]  WHQ#:[id number]  Patient Care Team: [name], MD as PCP - General (Internal Medicine) Jovita Kussmaul, MD as Consulting Physician (General Surgery) Davaughn Hillyard, Virgie Dad, MD as Consulting Physician (Oncology) Gery Pray, MD as Consulting Physician (Radiation Oncology)    CHIEF COMPLAINT:  Hx of Right Breast ductal carcinoma in situ; new left breast DCIS  CURRENT TREATMENT: anastrozole; zoledronate yearly   INTERVAL HISTORY: Debra Henderson returns today for follow-up of her remote right breast noninvasive cancer.   The patient continues on anastrozole.  She tolerates it well, with no hot flashes or other side effects that she is aware of.    She also continues on yearly zolendronate.  She has had no symptoms of hypoglycemia related to this.  She has not yet been scheduled for that this month.  Since her last visit, she underwent bilateral diagnostic mammography with tomography at [facility] on 09/23/2019 showing: breast density category B; no evidence of malignancy in either breast.  She also underwent bone density screening on 12/01/2019 showing a T-score of -2.7, which is unchanged from 2 years prior   REVIEW OF SYSTEMS: Debra Henderson sometimes has cramps in her breasts.  These can, at no particular time.  They only last a minute or 2.  She does pressure on massage and the problem resolves.  They are not frequent enough for her to want to do anything about it.  She does not walk for exercise but she tells me they sold their building and there clearing it out and so she is doing a little bit of work as far as that is concerned.  She had a quiet Thanksgiving's at her daughters.  She is very concerned that her husband might not make it through Christmas.  A detailed review of systems today was otherwise stable.   COVID 19 VACCINATION STATUS: Status post  Moderna x2, most recently April 2021   RIGHT BREAST CANCER HISTORY: From the original intake note:   Debra Henderson underwent screening mammography in December 2013. Calcifications were noted in the right breast, requiring further evaluation.  Subsequently, a stereotactic right breast biopsy was obtained on 02/13/2012 which confirmed ductal carcinoma in situ with calcifications, low grade, ER and PR positive, both at 100%. [phone])  The patient underwent a right lumpectomy under the care of Dr. Marlou Starks on 04/22/2012. (Surgery have been delayed only slightly secondary to the patient's cardiac history and the requirements of restaging of the left common iliac artery prior to surgery.) Final pathology showed a low-grade ductal carcinoma in situ with a 0.1 cm focus of disease, and close to the posterior margin at 0.1 cm.  Atypical lobular hyperplasia was also noted. PTis, pNX. [phone]).  The patient was treated with adjuvant radiation therapy under the care of Dr. Pablo Ledger, completed 08/23/2012.  Her subsequent history is as detailed below.     PAST MEDICAL HISTORY: Past Medical History:  Diagnosis Date  . Asthma   . Breast cancer ([place]) 02/13/12   right upper outer- DCIS, ER/PR+  . Cancer [facility]) 2014   breast cancer  . Chronic kidney disease    renal artery stenosis  . Claudication [facility])    lower extremities  . Complication of anesthesia    ' It does not work " I am difficult to put tpo sleep  . Family history of anesthesia complication    my brother is difficult to  put to sleep also  . GERD (gastroesophageal reflux disease)    otc  . History of renal stent    LEA DUPLEX, 03/16/2012 - LEFT EIA DISTAL/COMMON FEMORAL ARTERY-demonstrated occlusive disease  . Hyperlipidemia   . Hypertension   . Incontinence   . Peripheral vascular disease ([place])    prior stenting of left iliac artery  . Personal history of radiation therapy 2014   right breast  . Personal history of radiation therapy  2019   left breast  . Radiation 08/02/12-08/23/12   Right breast 42.72 Gy x 16 fx  . Renal artery stenosis ([facility])    RENAL DOPPLER, 02/12/2011 - RIGHT RENAL ARTERY 60-99% diameter reduction, LEFT RENAL ARTERY AT STENT 60-99% diameter reduction    PAST SURGICAL HISTORY: Past Surgical History:  Procedure Laterality Date  . ANGIOPLASTY ILLIAC ARTERY  02/24/2012   Dr Gwenlyn Found  L iliac restent  . ATHERECTOMY N/A 02/24/2012   Procedure: ATHERECTOMY;  Surgeon: Lorretta Harp, MD;  Location: [facility] CATH LAB;  Service: Cardiovascular;  Laterality: N/A;  . BLADDER SUSPENSION  1980's  . bladder tack    . BREAST BIOPSY Right 05/2013  . BREAST BIOPSY Right 02/13/2012  . BREAST BIOPSY Left 12/25/2016  . BREAST LUMPECTOMY Right 05/02/2012  . BREAST LUMPECTOMY Left 02/06/2017  . BREAST LUMPECTOMY WITH NEEDLE LOCALIZATION Right 04/22/2012   Procedure: RIGHT BREAST WIRE LOCALIZATION  LUMPECTOMY ;  Surgeon: Merrie Roof, MD;  Location: [place];  Service: General;  Laterality: Right;  . BREAST LUMPECTOMY WITH RADIOACTIVE SEED LOCALIZATION Left 02/06/2017   Procedure: BREAST LUMPECTOMY WITH RADIOACTIVE SEED LOCALIZATION;  Surgeon: Jovita Kussmaul, MD;  Location: [place];  Service: General;  Laterality: Left;  . fibroid breast     left breast-adenoma benign 1980's  . ILIAC ARTERY STENT  11/21/19/02   left   . NM MYOVIEW LTD  03/21/2010   normal myocardial perfusion study  . PERIPHERAL VASCULAR CATHETERIZATION Left 02/24/2012   Common iliac artery, 8x38 iCast Stent, resulting in a reduction from 80% in-stent restenosis to 0% residual  . PERIPHERAL VASCULAR CATHETERIZATION Left 08/04/2006   Renal 90% stenosis, 3.5x 13 drug-eluting stent resulting in a reduction of 90% in-stent restenosis to 0% residual  . PERIPHERAL VASCULAR CATHETERIZATION Left 05/22/2005   Renal 80% in-stent stenosis, 5x15 Aviator resulting in a reduction of 80% in-stent restenosis to 0% residual  . PERIPHERAL VASCULAR CATHETERIZATION Left  02/22/2002   Renal in-stent restenosis, 5x15 Guidant rapid-exchange balloon resulting in reduction of a 95% in-stent restenosis to less than 20% residual  . PERIPHERAL VASCULAR CATHETERIZATION Left 12/10/2000   95% renal stenosis, 6x17mm Genesis Aviator balloon/stent deployed at 10 atmospheres resulting in a reduction  of 95% stenosis to 0% residual; Common iliac artery stenosis, P12x4 mounted on a 7x2 Powerflex balloon resulting in a reduction of 80% to 0% residual  . PERIPHERAL VASCULAR CATHETERIZATION Left 07/27/2000   95% renal stenosis, 28mm x 6cm Smart stent resulting in a reduction of 90-95%  to 0% residual  . stent /kidney  12/10/00,08/04/06   2002 R renal artery, 2008 L renal  . TUBAL LIGATION     1980's    FAMILY HISTORY (reviewed 06/07/2013) Family History  Problem Relation Age of Onset  . Cancer Mother        colon  . COPD Mother   . Cancer Father        brain  . Stroke Sister   . Heart attack Sister    Mother  died at the age of 44. Father died at the age of 70. She has one older brother who has Crohn's disease. She has a younger sister who has multiple health issues secondary to having been a premature infant.  There is no known history of breast or GYN cancer in the family.   GYNECOLOGIC HISTORY:   (Reviewed 06/07/2013) [place] P2;  she took birth control pills for approximately 2 years in her 13s with good tolerance. She went through menopause naturally, approximately age 67.   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 Her husband, Jori Moll, is retired.  He has asbestosis. They have 2 daughters, one who lives locally, another in [place].  The patient has 3 grandchildren, ages 53, 49, and 87.    ADVANCED DIRECTIVES: In the absence of any documents to the contrary the patient's husband is her healthcare power of attorney   HEALTH MAINTENANCE:  Social History   Tobacco Use  . Smoking status: Former Smoker    Packs/day: 1.00    Types:  Cigarettes    Quit date: 02/24/1988    Years since quitting: 31.8  . Smokeless tobacco: Never Used  . Tobacco comment: quit 1990  Vaping Use  . Vaping Use: Never used  Substance Use Topics  . Alcohol use: No  . Drug use: No     Colonoscopy:  03/04/2017/ polyp removal/ Dr. Havery Moros  PAP:   Bone density: 07/15/2017 showed a T-score of -2.7  Lipid panel:   Allergies  Allergen Reactions  . Tetracyclines & Related Nausea And Vomiting and Other (See Comments)    Vomiting blood  . Vytorin [Ezetimibe-Simvastatin] Other (See Comments)    Per Dr Adora Fridge note  . Chicken Allergy Diarrhea and Rash  . Eggs Or Egg-Derived Products Diarrhea and Rash  . Erythromycin Rash  . Lipitor [Atorvastatin] Rash and Other (See Comments)    Myalgias   . Penicillins Swelling and Rash  . Sulfa Antibiotics Swelling and Rash    Current Outpatient Medications  Medication Sig Dispense Refill  . anastrozole (ARIMIDEX) 1 MG tablet Take 1 tablet (1 mg total) by mouth daily. 90 tablet 4  . aspirin 81 MG tablet Take 81 mg by mouth daily.    . clopidogrel (PLAVIX) 75 MG tablet TAKE1 TABLET EVERY DAY  3  . losartan-hydrochlorothiazide (HYZAAR) 50-12.5 MG per tablet TAKE 1 TABLET BY MOUTH EVERY DAY (Patient not taking: Reported on 06/01/2017) 30 tablet 0  . omega-3 acid ethyl esters (LOVAZA) 1 G capsule Take 1 g by mouth 2 (two) times daily.    . OXYBUTYNIN CHLORIDE PO Take by mouth.     No current facility-administered medications for this visit.    OBJECTIVE: White Henderson who appears stated age 9:   12/26/19 1400  BP: 126/60  Pulse: 93  Resp: 18  Temp: 98.6 F (37 C)  SpO2: 100%     Body mass index is 24.43 kg/m.    ECOG FS: 1 Filed Weights   12/26/19 1400  Weight: 125 lb 1.6 oz (56.7 kg)    Sclerae unicteric, EOMs intact Wearing a mask No cervical or supraclavicular adenopathy Lungs no rales or rhonchi Heart regular rate and rhythm Abd soft, nontender, positive bowel sounds MSK no focal  spinal tenderness, no upper extremity lymphedema Neuro: nonfocal, well oriented, appropriate affect Breasts: Status post bilateral lumpectomies.  There is no evidence of disease recurrence.  Both axillae are benign.   LAB RESULTS:  CMP   Lab Results  Component Value Date   WBC 7.7 12/26/2019   NEUTROABS 4.9 12/26/2019   HGB 12.3 12/26/2019   HCT 36.6 12/26/2019   MCV 96.3 12/26/2019   PLT 372 12/26/2019      Chemistry      Component Value Date/Time   NA 129 (L) 12/26/2019 1338   NA 138 10/03/2013 1505   K 3.7 12/26/2019 1338   K 3.2 (L) 10/03/2013 1505   CL 94 (L) 12/26/2019 1338   CL 100 05/20/2012 1454   CO2 28 12/26/2019 1338   CO2 27 10/03/2013 1505   BUN 9 12/26/2019 1338   BUN 7.2 10/03/2013 1505   CREATININE 0.82 12/26/2019 1338   CREATININE 0.8 10/03/2013 1505      Component Value Date/Time   CALCIUM 9.6 12/26/2019 1338   CALCIUM 9.5 10/03/2013 1505   ALKPHOS 67 12/26/2019 1338   ALKPHOS 76 10/03/2013 1505   AST 12 (L) 12/26/2019 1338   AST 11 10/03/2013 1505   ALT 8 12/26/2019 1338   ALT 9 10/03/2013 1505   BILITOT 0.3 12/26/2019 1338   BILITOT 0.31 10/03/2013 1505      STUDIES: DG Bone Density  Result Date: 12/02/2019 EXAM: DUAL X-RAY ABSORPTIOMETRY (DXA) FOR BONE MINERAL DENSITY IMPRESSION: Referring Physician:  Chauncey Cruel Your patient completed a BMD test using Lunar IDXA DXA system ( analysis version: 16 ) manufactured by [employer]. Technologist: AW PATIENT: Name: Debra Henderson, Debra Henderson Patient ID: [id number] Birth Date: 02-20-1945 Height: 59.5 in. Sex: Female Measured: 12/01/2019 Weight: 127.0 lbs. Indications: Advanced Age, Breast Cancer History, Caucasian, Estrogen Deficient, Family History of Osteoporosis, Postmenopausal Fractures: nose Treatments: Calcium (E943.0), Vitamin D (E933.5) ASSESSMENT: The BMD measured at Femur Neck Left is 0.660 g/cm2 with a T-score of -2.7. This patient is considered osteoporotic according to [facility]) criteria. The scan quality is good. L-3 was excluded due to degenerative changes. Site Region Measured Date Measured Age YA BMD Significant CHANGE T-score DualFemur Neck Left  12/01/2019    72.5         -2.7    0.660 g/cm2 DualFemur Neck Left  07/15/2017    70.1         -2.5    0.696 g/cm2 AP Spine L1-L3 12/01/2019 72.5 -2.3 0.896 g/cm2 * AP Spine  L1-L3      07/15/2017    70.1         -2.7    0.846 g/cm2 DualFemur Total Mean 12/01/2019    72.5         -2.3    0.716 g/cm2 DualFemur Total Mean 07/15/2017    70.1         -2.2    0.728 g/cm2 World Health Organization [facility]) criteria for post-menopausal, Caucasian Women: Normal       T-score at or above -1 SD Osteopenia   T-score between -1 and -2.5 SD Osteoporosis T-score at or below -2.5 SD RECOMMENDATION: 1. All patients should optimize calcium and vitamin D intake. 2. Consider FDA approved medical therapies in postmenopausal women and men aged 56 years and older, based on the following: a. A hip or vertebral (clinical or morphometric) fracture b. T- score < or = -2.5 at the femoral neck or spine after appropriate evaluation to exclude secondary causes c. Low bone mass (T-score between -1.0 and -2.5 at the femoral neck or spine) and a 10 year probability of a hip fracture > or = 3% or a 10 year probability of a major osteoporosis-related fracture >  or = 20% based on the [place]-adapted WHO algorithm d. Clinician judgment and/or patient preferences may indicate treatment for people with 10-year fracture probabilities above or below these levels FOLLOW-UP: Patients with diagnosis of osteoporosis or at high risk for fracture should have regular bone mineral density tests. For patients eligible for Medicare, routine testing is allowed once every 2 years. The testing frequency can be increased to one year for patients who have rapidly progressing disease, those who are receiving or discontinuing medical therapy to restore bone mass, or have additional risk  factors. I have reviewed this report and agree with the above findings. [facility] Radiology Electronically Signed   By: Lowella Grip III M.D.   On: 12/02/2019 07:46      ASSESSMENT: 74 y.o. Debra Henderson:  1)  status post  right lumpectomy April 2014 for a low-grade ductal carcinoma in situ. Focally close to the posterior margin at 0.1 cm. ER +100%, PR +100%.  2) Status post radiation therapy under the care of Dr. Pablo Ledger, completed 08/23/2012  3) decided against antiestrogens June 2015  4)  multiple comorbidities including peripheral vascular disease with stenting of the left iliac artery; hypertension; claudication; and a history of renal artery stenosis, with stenting of the left renal artery.  5) right breast upper inner quadrant biopsy 06/17/2013 showed a complex sclerosing lesion  6) status post left breast upper outer quadrant biopsy 12/25/2016 showing ductal carcinoma in situ, low-grade, estrogen and progesterone receptor positive.  7) left lumpectomy 02/06/2017 ductal carcinoma in situ, grade 1, measuring 1.0 cm, with negative margins  8) adjuvant radiation 04/02/17-04/30/17 Site/dose: 1) Left breast/ 40.05 Gy in 15 fractions 2) Left breast boost/ 12 Gy in 6 fractions  9) anastrozole started 06/01/2017  (a) bone density 07/15/2017 showed a T score of -2.7  (b) zoledronate started 12/24/2017, repeated yearly  (c) repeat bone density 12/02/2019 stable with T- 2.7   PLAN: Debra Henderson is now just about 3 years out from definitive surgery for her most recent noninvasive breast cancer, with no evidence of disease recurrence.  This is very favorable.  She is tolerating anastrozole well and the plan is to continue that a total of 5 years.  Her bone density is stable.  We are going to continue Zometa every December.  She will receive a dose 01/05/2020.  She will then see me again in 1 year.  I wished her husband at the best through the holidays as he has significant pulmonary  and cardiac disease and she is very concerned he might not make it much beyond Christmas.  Total encounter time 25 minutes.*    Romy Ipock, Virgie Dad, MD  12/26/19 2:42 PM Medical Oncology and Hematology [facility] [place], [place] [address] Tel. [phone]    Fax. [phone]   I, Wilburn Mylar, am acting as scribe for Dr. Virgie Dad. Debra Henderson.  I, Lurline Del MD, have reviewed the above documentation for accuracy and completeness, and I agree with the above.   *Total Encounter Time as defined by the Centers for Medicare and Medicaid Services includes, in addition to the face-to-face time of a patient visit (documented in the note above) non-face-to-face time: obtaining and reviewing outside history, ordering and reviewing medications, tests or procedures, care coordination (communications with other health care professionals or caregivers) and documentation in the medical record.

## 2019-12-26 ENCOUNTER — Other Ambulatory Visit: Payer: Self-pay

## 2019-12-26 ENCOUNTER — Inpatient Hospital Stay: Payer: PPO

## 2019-12-26 ENCOUNTER — Inpatient Hospital Stay: Payer: PPO | Attending: Oncology | Admitting: Oncology

## 2019-12-26 VITALS — BP 126/60 | HR 93 | Temp 98.6°F | Resp 18 | Ht 60.0 in | Wt 125.1 lb

## 2019-12-26 DIAGNOSIS — Z923 Personal history of irradiation: Secondary | ICD-10-CM | POA: Diagnosis not present

## 2019-12-26 DIAGNOSIS — D0512 Intraductal carcinoma in situ of left breast: Secondary | ICD-10-CM | POA: Insufficient documentation

## 2019-12-26 DIAGNOSIS — Z87891 Personal history of nicotine dependence: Secondary | ICD-10-CM | POA: Diagnosis not present

## 2019-12-26 DIAGNOSIS — M810 Age-related osteoporosis without current pathological fracture: Secondary | ICD-10-CM | POA: Diagnosis not present

## 2019-12-26 DIAGNOSIS — I10 Essential (primary) hypertension: Secondary | ICD-10-CM | POA: Insufficient documentation

## 2019-12-26 DIAGNOSIS — D0511 Intraductal carcinoma in situ of right breast: Secondary | ICD-10-CM

## 2019-12-26 DIAGNOSIS — M818 Other osteoporosis without current pathological fracture: Secondary | ICD-10-CM

## 2019-12-26 DIAGNOSIS — I739 Peripheral vascular disease, unspecified: Secondary | ICD-10-CM | POA: Insufficient documentation

## 2019-12-26 DIAGNOSIS — Z86000 Personal history of in-situ neoplasm of breast: Secondary | ICD-10-CM | POA: Insufficient documentation

## 2019-12-26 LAB — COMPREHENSIVE METABOLIC PANEL
ALT: 8 U/L (ref 0–44)
AST: 12 U/L — ABNORMAL LOW (ref 15–41)
Albumin: 3.8 g/dL (ref 3.5–5.0)
Alkaline Phosphatase: 67 U/L (ref 38–126)
Anion gap: 7 (ref 5–15)
BUN: 9 mg/dL (ref 8–23)
CO2: 28 mmol/L (ref 22–32)
Calcium: 9.6 mg/dL (ref 8.9–10.3)
Chloride: 94 mmol/L — ABNORMAL LOW (ref 98–111)
Creatinine, Ser: 0.82 mg/dL (ref 0.44–1.00)
GFR, Estimated: >60 mL/min (ref >60)
Glucose, Bld: 97 mg/dL (ref 70–99)
Potassium: 3.7 mmol/L (ref 3.5–5.1)
Sodium: 129 mmol/L — ABNORMAL LOW (ref 135–145)
Total Bilirubin: 0.3 mg/dL (ref 0.3–1.2)
Total Protein: 7.8 g/dL (ref 6.5–8.1)

## 2019-12-26 LAB — CBC WITH DIFFERENTIAL/PLATELET
Abs Immature Granulocytes: 0.03 10*3/uL (ref 0.00–0.07)
Basophils Absolute: 0 10*3/uL (ref 0.0–0.1)
Basophils Relative: 0 %
Eosinophils Absolute: 0 10*3/uL (ref 0.0–0.5)
Eosinophils Relative: 0 %
HCT: 36.6 % (ref 36.0–46.0)
Hemoglobin: 12.3 g/dL (ref 12.0–15.0)
Immature Granulocytes: 0 %
Lymphocytes Relative: 24 %
Lymphs Abs: 1.8 10*3/uL (ref 0.7–4.0)
MCH: 32.4 pg (ref 26.0–34.0)
MCHC: 33.6 g/dL (ref 30.0–36.0)
MCV: 96.3 fL (ref 80.0–100.0)
Monocytes Absolute: 0.8 10*3/uL (ref 0.1–1.0)
Monocytes Relative: 11 %
Neutro Abs: 4.9 10*3/uL (ref 1.7–7.7)
Neutrophils Relative %: 65 %
Platelets: 372 10*3/uL (ref 150–400)
RBC: 3.8 MIL/uL — ABNORMAL LOW (ref 3.87–5.11)
RDW: 13 % (ref 11.5–15.5)
WBC: 7.7 10*3/uL (ref 4.0–10.5)
nRBC: 0 % (ref 0.0–0.2)

## 2019-12-26 MED ORDER — ANASTROZOLE 1 MG PO TABS
1.0000 mg | ORAL_TABLET | Freq: Every day | ORAL | 4 refills | Status: DC
Start: 2019-12-26 — End: 2021-01-21

## 2019-12-27 ENCOUNTER — Telehealth: Payer: Self-pay | Admitting: Oncology

## 2019-12-27 NOTE — Telephone Encounter (Signed)
Scheduled appts per 12/6 los. Left voicemail with appt date and time.

## 2020-01-05 ENCOUNTER — Other Ambulatory Visit: Payer: Self-pay

## 2020-01-05 ENCOUNTER — Ambulatory Visit: Payer: PPO

## 2020-01-05 ENCOUNTER — Inpatient Hospital Stay: Payer: PPO

## 2020-01-05 VITALS — BP 138/59 | HR 88 | Temp 97.8°F | Resp 18

## 2020-01-05 DIAGNOSIS — D0511 Intraductal carcinoma in situ of right breast: Secondary | ICD-10-CM

## 2020-01-05 DIAGNOSIS — M818 Other osteoporosis without current pathological fracture: Secondary | ICD-10-CM

## 2020-01-05 DIAGNOSIS — D0512 Intraductal carcinoma in situ of left breast: Secondary | ICD-10-CM | POA: Diagnosis not present

## 2020-01-05 MED ORDER — ZOLEDRONIC ACID 4 MG/100ML IV SOLN
INTRAVENOUS | Status: AC
  Filled 2020-01-05: qty 100

## 2020-01-05 MED ORDER — ZOLEDRONIC ACID 4 MG/100ML IV SOLN
4.0000 mg | Freq: Once | INTRAVENOUS | Status: AC
  Administered 2020-01-05: 4 mg via INTRAVENOUS

## 2020-01-05 MED ORDER — SODIUM CHLORIDE 0.9 % IV SOLN
Freq: Once | INTRAVENOUS | Status: AC
  Filled 2020-01-05: qty 250

## 2020-01-05 NOTE — Patient Instructions (Signed)
Zoledronic Acid injection (Hypercalcemia, Oncology) What is this medicine? ZOLEDRONIC ACID (ZOE le dron ik AS id) lowers the amount of calcium loss from bone. It is used to treat too much calcium in your blood from cancer. It is also used to prevent complications of cancer that has spread to the bone. This medicine may be used for other purposes; ask your health care provider or pharmacist if you have questions. COMMON BRAND NAME(S): Zometa What should I tell my health care provider before I take this medicine? They need to know if you have any of these conditions:  aspirin-sensitive asthma  cancer, especially if you are receiving medicines used to treat cancer  dental disease or wear dentures  infection  kidney disease  receiving corticosteroids like dexamethasone or prednisone  an unusual or allergic reaction to zoledronic acid, other medicines, foods, dyes, or preservatives  pregnant or trying to get pregnant  breast-feeding How should I use this medicine? This medicine is for infusion into a vein. It is given by a health care professional in a hospital or clinic setting. Talk to your pediatrician regarding the use of this medicine in children. Special care may be needed. Overdosage: If you think you have taken too much of this medicine contact a poison control center or emergency room at once. NOTE: This medicine is only for you. Do not share this medicine with others. What if I miss a dose? It is important not to miss your dose. Call your doctor or health care professional if you are unable to keep an appointment. What may interact with this medicine?  certain antibiotics given by injection  NSAIDs, medicines for pain and inflammation, like ibuprofen or naproxen  some diuretics like bumetanide, furosemide  teriparatide  thalidomide This list may not describe all possible interactions. Give your health care provider a list of all the medicines, herbs, non-prescription  drugs, or dietary supplements you use. Also tell them if you smoke, drink alcohol, or use illegal drugs. Some items may interact with your medicine. What should I watch for while using this medicine? Visit your doctor or health care professional for regular checkups. It may be some time before you see the benefit from this medicine. Do not stop taking your medicine unless your doctor tells you to. Your doctor may order blood tests or other tests to see how you are doing. Women should inform their doctor if they wish to become pregnant or think they might be pregnant. There is a potential for serious side effects to an unborn child. Talk to your health care professional or pharmacist for more information. You should make sure that you get enough calcium and vitamin D while you are taking this medicine. Discuss the foods you eat and the vitamins you take with your health care professional. Some people who take this medicine have severe bone, joint, and/or muscle pain. This medicine may also increase your risk for jaw problems or a broken thigh bone. Tell your doctor right away if you have severe pain in your jaw, bones, joints, or muscles. Tell your doctor if you have any pain that does not go away or that gets worse. Tell your dentist and dental surgeon that you are taking this medicine. You should not have major dental surgery while on this medicine. See your dentist to have a dental exam and fix any dental problems before starting this medicine. Take good care of your teeth while on this medicine. Make sure you see your dentist for regular follow-up   appointments. What side effects may I notice from receiving this medicine? Side effects that you should report to your doctor or health care professional as soon as possible:  allergic reactions like skin rash, itching or hives, swelling of the face, lips, or tongue  anxiety, confusion, or depression  breathing problems  changes in vision  eye  pain  feeling faint or lightheaded, falls  jaw pain, especially after dental work  mouth sores  muscle cramps, stiffness, or weakness  redness, blistering, peeling or loosening of the skin, including inside the mouth  trouble passing urine or change in the amount of urine Side effects that usually do not require medical attention (report to your doctor or health care professional if they continue or are bothersome):  bone, joint, or muscle pain  constipation  diarrhea  fever  hair loss  irritation at site where injected  loss of appetite  nausea, vomiting  stomach upset  trouble sleeping  trouble swallowing  weak or tired This list may not describe all possible side effects. Call your doctor for medical advice about side effects. You may report side effects to FDA at 1-800-FDA-1088. Where should I keep my medicine? This drug is given in a hospital or clinic and will not be stored at home. NOTE: This sheet is a summary. It may not cover all possible information. If you have questions about this medicine, talk to your doctor, pharmacist, or health care provider.  2020 Elsevier/Gold Standard (2013-06-04 14:19:39)  

## 2020-02-18 DIAGNOSIS — E7849 Other hyperlipidemia: Secondary | ICD-10-CM | POA: Diagnosis not present

## 2020-02-18 DIAGNOSIS — I701 Atherosclerosis of renal artery: Secondary | ICD-10-CM | POA: Diagnosis not present

## 2020-02-18 DIAGNOSIS — I10 Essential (primary) hypertension: Secondary | ICD-10-CM | POA: Diagnosis not present

## 2020-02-18 DIAGNOSIS — I700 Atherosclerosis of aorta: Secondary | ICD-10-CM | POA: Diagnosis not present

## 2020-03-13 DIAGNOSIS — E7849 Other hyperlipidemia: Secondary | ICD-10-CM | POA: Diagnosis not present

## 2020-03-13 DIAGNOSIS — L2089 Other atopic dermatitis: Secondary | ICD-10-CM | POA: Diagnosis not present

## 2020-03-13 DIAGNOSIS — Z0001 Encounter for general adult medical examination with abnormal findings: Secondary | ICD-10-CM | POA: Diagnosis not present

## 2020-03-13 DIAGNOSIS — I700 Atherosclerosis of aorta: Secondary | ICD-10-CM | POA: Diagnosis not present

## 2020-03-13 DIAGNOSIS — I10 Essential (primary) hypertension: Secondary | ICD-10-CM | POA: Diagnosis not present

## 2020-03-13 DIAGNOSIS — R3121 Asymptomatic microscopic hematuria: Secondary | ICD-10-CM | POA: Diagnosis not present

## 2020-03-13 DIAGNOSIS — I739 Peripheral vascular disease, unspecified: Secondary | ICD-10-CM | POA: Diagnosis not present

## 2020-03-13 DIAGNOSIS — Z1331 Encounter for screening for depression: Secondary | ICD-10-CM | POA: Diagnosis not present

## 2020-03-13 DIAGNOSIS — Z6824 Body mass index (BMI) 24.0-24.9, adult: Secondary | ICD-10-CM | POA: Diagnosis not present

## 2020-03-13 DIAGNOSIS — M810 Age-related osteoporosis without current pathological fracture: Secondary | ICD-10-CM | POA: Diagnosis not present

## 2020-03-13 DIAGNOSIS — D0591 Unspecified type of carcinoma in situ of right breast: Secondary | ICD-10-CM | POA: Diagnosis not present

## 2020-03-16 DIAGNOSIS — Z1389 Encounter for screening for other disorder: Secondary | ICD-10-CM | POA: Diagnosis not present

## 2020-03-16 DIAGNOSIS — Z6824 Body mass index (BMI) 24.0-24.9, adult: Secondary | ICD-10-CM | POA: Diagnosis not present

## 2020-03-16 DIAGNOSIS — Z0001 Encounter for general adult medical examination with abnormal findings: Secondary | ICD-10-CM | POA: Diagnosis not present

## 2020-03-19 DIAGNOSIS — I739 Peripheral vascular disease, unspecified: Secondary | ICD-10-CM | POA: Diagnosis not present

## 2020-03-19 DIAGNOSIS — Z136 Encounter for screening for cardiovascular disorders: Secondary | ICD-10-CM | POA: Diagnosis not present

## 2020-03-19 DIAGNOSIS — R0989 Other specified symptoms and signs involving the circulatory and respiratory systems: Secondary | ICD-10-CM | POA: Diagnosis not present

## 2020-03-19 DIAGNOSIS — I10 Essential (primary) hypertension: Secondary | ICD-10-CM | POA: Diagnosis not present

## 2020-03-19 DIAGNOSIS — Z87891 Personal history of nicotine dependence: Secondary | ICD-10-CM | POA: Diagnosis not present

## 2020-03-28 ENCOUNTER — Other Ambulatory Visit: Payer: Self-pay

## 2020-03-28 ENCOUNTER — Other Ambulatory Visit (HOSPITAL_COMMUNITY): Payer: Self-pay | Admitting: Physician Assistant

## 2020-03-28 ENCOUNTER — Ambulatory Visit (HOSPITAL_COMMUNITY)
Admission: RE | Admit: 2020-03-28 | Discharge: 2020-03-28 | Disposition: A | Discharge status: Home / Self Care | Payer: PPO | Source: Ambulatory Visit | Attending: Physician Assistant | Admitting: Physician Assistant

## 2020-03-28 DIAGNOSIS — M7989 Other specified soft tissue disorders: Secondary | ICD-10-CM | POA: Diagnosis not present

## 2020-03-28 DIAGNOSIS — F329 Major depressive disorder, single episode, unspecified: Secondary | ICD-10-CM | POA: Diagnosis not present

## 2020-03-28 DIAGNOSIS — M79674 Pain in right toe(s): Secondary | ICD-10-CM | POA: Diagnosis not present

## 2020-03-28 DIAGNOSIS — M25521 Pain in right elbow: Secondary | ICD-10-CM | POA: Insufficient documentation

## 2020-03-28 DIAGNOSIS — M25531 Pain in right wrist: Secondary | ICD-10-CM

## 2020-03-28 DIAGNOSIS — M25562 Pain in left knee: Secondary | ICD-10-CM | POA: Diagnosis not present

## 2020-03-28 DIAGNOSIS — Z6824 Body mass index (BMI) 24.0-24.9, adult: Secondary | ICD-10-CM | POA: Diagnosis not present

## 2020-03-28 DIAGNOSIS — S52121A Displaced fracture of head of right radius, initial encounter for closed fracture: Secondary | ICD-10-CM | POA: Diagnosis not present

## 2020-03-30 ENCOUNTER — Encounter: Payer: Self-pay | Admitting: Gastroenterology

## 2020-03-30 DIAGNOSIS — S52124A Nondisplaced fracture of head of right radius, initial encounter for closed fracture: Secondary | ICD-10-CM | POA: Diagnosis not present

## 2020-03-30 DIAGNOSIS — M25521 Pain in right elbow: Secondary | ICD-10-CM | POA: Diagnosis not present

## 2020-04-10 DIAGNOSIS — S52124A Nondisplaced fracture of head of right radius, initial encounter for closed fracture: Secondary | ICD-10-CM | POA: Diagnosis not present

## 2020-05-03 DIAGNOSIS — S52124A Nondisplaced fracture of head of right radius, initial encounter for closed fracture: Secondary | ICD-10-CM | POA: Diagnosis not present

## 2020-05-17 ENCOUNTER — Encounter: Payer: Self-pay | Admitting: *Deleted

## 2020-05-18 ENCOUNTER — Ambulatory Visit (INDEPENDENT_AMBULATORY_CARE_PROVIDER_SITE_OTHER): Payer: PPO | Admitting: Cardiology

## 2020-05-18 ENCOUNTER — Encounter: Payer: Self-pay | Admitting: Cardiology

## 2020-05-18 ENCOUNTER — Other Ambulatory Visit: Payer: Self-pay

## 2020-05-18 VITALS — BP 118/60 | HR 80 | Ht 60.0 in | Wt 119.0 lb

## 2020-05-18 DIAGNOSIS — I6523 Occlusion and stenosis of bilateral carotid arteries: Secondary | ICD-10-CM | POA: Diagnosis not present

## 2020-05-18 DIAGNOSIS — R55 Syncope and collapse: Secondary | ICD-10-CM | POA: Diagnosis not present

## 2020-05-18 NOTE — Progress Notes (Signed)
Clinical Summary Debra Henderson is a 75 y.o.female seen today as a new consult for the following medical problems  1. Syncope - 2-3 episodes  - first episode roughly 6 months ago - was standing at kitchen sink. Started trembling, next thing she knew was on the floor. Poor oral intake.  - next episode few months later. She was stepping on a stool, when she got to the 2nd step and had a fall. - occasional dizziness at times, last episode about 6 week ago - no palpitaitons.   - water large glass x 2, 2-3 cokes per day, sweet tea.     2. PAD status post left renal artery stenting back in 2004 and restenting because of in-stent restenosis. She's also had left iliac stenting   3. HTN   4. Hyperlipidemia  5. Carotid bruit  Husband passed recently.  Past Medical History:  Diagnosis Date  . Asthma   . 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 Cancer [facility]) 2014   breast cancer  . Chronic kidney disease    renal artery stenosis  . Claudication [facility])    lower extremities  . Complication of anesthesia    ' It does not work " I am difficult to put tpo sleep  . Family history of anesthesia complication    my brother is difficult to put to sleep also  . GERD (gastroesophageal reflux disease)    otc  . History of renal stent    LEA DUPLEX, 03/16/2012 - LEFT EIA DISTAL/COMMON FEMORAL ARTERY-demonstrated occlusive disease  . Hyperlipidemia   . Hypertension   . Incontinence   . Peripheral vascular disease ([place])    prior stenting of left iliac artery  . Personal history of radiation therapy 2014   right breast  . Personal history of radiation therapy 2019   left breast  . Radiation 08/02/12-08/23/12   Right breast 42.72 Gy x 16 fx  . Renal artery stenosis ([facility])    RENAL DOPPLER, 02/12/2011 - RIGHT RENAL ARTERY 60-99% diameter reduction, LEFT RENAL ARTERY AT STENT 60-99% diameter reduction     Allergies  Allergen Reactions  . Tetracyclines & Related  Nausea And Vomiting and Other (See Comments)    Vomiting blood  . Vytorin [Ezetimibe-Simvastatin] Other (See Comments)    Per Dr Adora Fridge note  . Chicken Allergy Diarrhea and Rash  . Eggs Or Egg-Derived Products Diarrhea and Rash  . Erythromycin Rash  . Lipitor [Atorvastatin] Rash and Other (See Comments)    Myalgias   . Penicillins Swelling and Rash  . Sulfa Antibiotics Swelling and Rash     Current Outpatient Medications  Medication Sig Dispense Refill  . anastrozole (ARIMIDEX) 1 MG tablet Take 1 tablet (1 mg total) by mouth daily. 90 tablet 4  . clopidogrel (PLAVIX) 75 MG tablet TAKE1 TABLET EVERY DAY  3  . olmesartan-hydrochlorothiazide (BENICAR HCT) 40-12.5 MG tablet Take 1 tablet by mouth daily.    Marland Kitchen omega-3 acid ethyl esters (LOVAZA) 1 G capsule Take 1 g by mouth 2 (two) times daily.    . OXYBUTYNIN CHLORIDE PO Take by mouth.    . pravastatin (PRAVACHOL) 10 MG tablet Take 10 mg by mouth daily.     No current facility-administered medications for this visit.     Past Surgical History:  Procedure Laterality Date  . ANGIOPLASTY ILLIAC ARTERY  02/24/2012   Dr Gwenlyn Found  L iliac restent  . ATHERECTOMY N/A 02/24/2012  Procedure: ATHERECTOMY;  Surgeon: Lorretta Harp, MD;  Location: [facility] CATH LAB;  Service: Cardiovascular;  Laterality: N/A;  . BLADDER SUSPENSION  1980's  . bladder tack    . BREAST BIOPSY Right 05/2013  . BREAST BIOPSY Right 02/13/2012  . BREAST BIOPSY Left 12/25/2016  . BREAST LUMPECTOMY Right 05/02/2012  . BREAST LUMPECTOMY Left 02/06/2017  . BREAST LUMPECTOMY WITH NEEDLE LOCALIZATION Right 04/22/2012   Procedure: RIGHT BREAST WIRE LOCALIZATION  LUMPECTOMY ;  Surgeon: Merrie Roof, MD;  Location: [place];  Service: General;  Laterality: Right;  . BREAST LUMPECTOMY WITH RADIOACTIVE SEED LOCALIZATION Left 02/06/2017   Procedure: BREAST LUMPECTOMY WITH RADIOACTIVE SEED LOCALIZATION;  Surgeon: Jovita Kussmaul, MD;  Location: [place];  Service:  General;  Laterality: Left;  . fibroid breast     left breast-adenoma benign 1980's  . ILIAC ARTERY STENT  11/21/19/02   left   . NM MYOVIEW LTD  03/21/2010   normal myocardial perfusion study  . PERIPHERAL VASCULAR CATHETERIZATION Left 02/24/2012   Common iliac artery, 8x38 iCast Stent, resulting in a reduction from 80% in-stent restenosis to 0% residual  . PERIPHERAL VASCULAR CATHETERIZATION Left 08/04/2006   Renal 90% stenosis, 3.5x 13 drug-eluting stent resulting in a reduction of 90% in-stent restenosis to 0% residual  . PERIPHERAL VASCULAR CATHETERIZATION Left 05/22/2005   Renal 80% in-stent stenosis, 5x15 Aviator resulting in a reduction of 80% in-stent restenosis to 0% residual  . PERIPHERAL VASCULAR CATHETERIZATION Left 02/22/2002   Renal in-stent restenosis, 5x15 Guidant rapid-exchange balloon resulting in reduction of a 95% in-stent restenosis to less than 20% residual  . PERIPHERAL VASCULAR CATHETERIZATION Left 12/10/2000   95% renal stenosis, 6x28mm Genesis Aviator balloon/stent deployed at 10 atmospheres resulting in a reduction  of 95% stenosis to 0% residual; Common iliac artery stenosis, P12x4 mounted on a 7x2 Powerflex balloon resulting in a reduction of 80% to 0% residual  . PERIPHERAL VASCULAR CATHETERIZATION Left 07/27/2000   95% renal stenosis, 16mm x 6cm Smart stent resulting in a reduction of 90-95%  to 0% residual  . stent /kidney  12/10/00,08/04/06   2002 R renal artery, 2008 L renal  . TUBAL LIGATION     1980's     Allergies  Allergen Reactions  . Tetracyclines & Related Nausea And Vomiting and Other (See Comments)    Vomiting blood  . Vytorin [Ezetimibe-Simvastatin] Other (See Comments)    Per Dr Adora Fridge note  . Chicken Allergy Diarrhea and Rash  . Eggs Or Egg-Derived Products Diarrhea and Rash  . Erythromycin Rash  . Lipitor [Atorvastatin] Rash and Other (See Comments)    Myalgias   . Penicillins Swelling and Rash  . Sulfa Antibiotics Swelling and Rash       Family History  Problem Relation Age of Onset  . Cancer Mother        colon  . COPD Mother   . Cancer Father        brain  . Stroke Sister   . Heart attack Sister      Social History Debra Henderson reports that she quit smoking about 32 years ago. Her smoking use included cigarettes. She smoked 1.00 pack per day. She has never used smokeless tobacco. Debra Henderson reports no history of alcohol use.   Review of Systems CONSTITUTIONAL: No weight loss, fever, chills, weakness or fatigue.  HEENT: Eyes: No visual loss, blurred vision, double vision or yellow sclerae.No hearing loss, sneezing, congestion, runny nose or sore throat.  SKIN: No rash or itching.  CARDIOVASCULAR: no chest pain, no palpitatoins RESPIRATORY: No shortness of breath, cough or sputum.  GASTROINTESTINAL: No anorexia, nausea, vomiting or diarrhea. No abdominal pain or blood.  GENITOURINARY: No burning on urination, no polyuria NEUROLOGICAL: per hpi  MUSCULOSKELETAL: No muscle, back pain, joint pain or stiffness.  LYMPHATICS: No enlarged nodes. No history of splenectomy.  PSYCHIATRIC: No history of depression or anxiety.  ENDOCRINOLOGIC: No reports of sweating, cold or heat intolerance. No polyuria or polydipsia.  Marland Kitchen   Physical Examination Today's Vitals   05/18/20 1330  BP: 118/60  Pulse: 80  SpO2: 98%  Weight: 119 lb (54 kg)  Height: 5' (1.524 m)   Body mass index is 23.24 kg/m.  Gen: resting comfortably, no acute distress HEENT: no scleral icterus, pupils equal round and reactive, no palptable cervical adenopathy,  CV: RRR, no m/r/g lno jvd Resp: Clear to auscultation bilaterally GI: abdomen is soft, non-tender, non-distended, normal bowel sounds, no hepatosplenomegaly MSK: extremities are warm, no edema.  Skin: warm, no rash Neuro:  no focal deficits Psych: appropriate affect     Assessment and Plan  1. Syncope - 2 episodes, last one occurring a few months ago - unclear etiology  based on history. Her orthostatics are essentailyl abnormal today with SBP dropping 18 points with standing. Orthostatic syncope would fit with description of episodes. Episodes overall not highly suggestive of cardiogenic syncope. Its unclear if the episode on the stool truly was syncope or just losing her footing.  - encouraged increased aggressive hydration - if recurrent episodes different in nature could consider home monitor. I don't see an indication for echo at this time  EKG shows NSR  2. Carotid stenosis - I don't hear much of a bruit on exam but had some mild plaque back in 2014 and needs a repeat study, will order carotid US   F/u 6 months   Arnoldo Lenis, M.D.

## 2020-05-18 NOTE — Patient Instructions (Signed)
Your physician recommends that you schedule a follow-up appointment in: 6 MONTHS WITH DR BRANCH  Your physician recommends that you continue on your current medications as directed. Please refer to the Current Medication list given to you today.  Your physician has requested that you have a carotid duplex. This test is an ultrasound of the carotid arteries in your neck. It looks at blood flow through these arteries that supply the brain with blood. Allow one hour for this exam. There are no restrictions or special instructions.  Thank you for choosing [facility]!!    

## 2020-05-24 DIAGNOSIS — H52223 Regular astigmatism, bilateral: Secondary | ICD-10-CM | POA: Diagnosis not present

## 2020-05-24 DIAGNOSIS — H25013 Cortical age-related cataract, bilateral: Secondary | ICD-10-CM | POA: Diagnosis not present

## 2020-06-01 DIAGNOSIS — S52124A Nondisplaced fracture of head of right radius, initial encounter for closed fracture: Secondary | ICD-10-CM | POA: Diagnosis not present

## 2020-06-27 DIAGNOSIS — H2513 Age-related nuclear cataract, bilateral: Secondary | ICD-10-CM | POA: Diagnosis not present

## 2020-06-27 DIAGNOSIS — H25043 Posterior subcapsular polar age-related cataract, bilateral: Secondary | ICD-10-CM | POA: Diagnosis not present

## 2020-06-27 DIAGNOSIS — H25013 Cortical age-related cataract, bilateral: Secondary | ICD-10-CM | POA: Diagnosis not present

## 2020-06-27 DIAGNOSIS — H02831 Dermatochalasis of right upper eyelid: Secondary | ICD-10-CM | POA: Diagnosis not present

## 2020-06-27 DIAGNOSIS — H2511 Age-related nuclear cataract, right eye: Secondary | ICD-10-CM | POA: Diagnosis not present

## 2020-07-10 DIAGNOSIS — H2511 Age-related nuclear cataract, right eye: Secondary | ICD-10-CM | POA: Diagnosis not present

## 2020-07-10 DIAGNOSIS — H25012 Cortical age-related cataract, left eye: Secondary | ICD-10-CM | POA: Diagnosis not present

## 2020-07-10 DIAGNOSIS — H25042 Posterior subcapsular polar age-related cataract, left eye: Secondary | ICD-10-CM | POA: Diagnosis not present

## 2020-07-10 DIAGNOSIS — H2512 Age-related nuclear cataract, left eye: Secondary | ICD-10-CM | POA: Diagnosis not present

## 2020-07-10 DIAGNOSIS — H2513 Age-related nuclear cataract, bilateral: Secondary | ICD-10-CM | POA: Diagnosis not present

## 2020-07-17 DIAGNOSIS — H2512 Age-related nuclear cataract, left eye: Secondary | ICD-10-CM | POA: Diagnosis not present

## 2020-07-17 DIAGNOSIS — H2513 Age-related nuclear cataract, bilateral: Secondary | ICD-10-CM | POA: Diagnosis not present

## 2020-09-25 ENCOUNTER — Other Ambulatory Visit: Payer: Self-pay | Admitting: Oncology

## 2020-09-25 DIAGNOSIS — Z9889 Other specified postprocedural states: Secondary | ICD-10-CM

## 2020-10-08 ENCOUNTER — Ambulatory Visit
Admission: RE | Admit: 2020-10-08 | Discharge: 2020-10-08 | Disposition: A | Discharge status: Home / Self Care | Payer: PPO | Source: Ambulatory Visit | Attending: Oncology | Admitting: Oncology

## 2020-10-08 ENCOUNTER — Other Ambulatory Visit: Payer: Self-pay

## 2020-10-08 DIAGNOSIS — R922 Inconclusive mammogram: Secondary | ICD-10-CM | POA: Diagnosis not present

## 2020-10-08 DIAGNOSIS — Z9889 Other specified postprocedural states: Secondary | ICD-10-CM

## 2020-10-08 DIAGNOSIS — Z853 Personal history of malignant neoplasm of breast: Secondary | ICD-10-CM | POA: Diagnosis not present

## 2020-12-03 DIAGNOSIS — R87619 Unspecified abnormal cytological findings in specimens from cervix uteri: Secondary | ICD-10-CM | POA: Diagnosis not present

## 2020-12-03 DIAGNOSIS — N871 Moderate cervical dysplasia: Secondary | ICD-10-CM | POA: Diagnosis not present

## 2020-12-26 ENCOUNTER — Other Ambulatory Visit: Payer: PPO

## 2020-12-26 ENCOUNTER — Ambulatory Visit: Payer: PPO | Admitting: Oncology

## 2021-01-19 ENCOUNTER — Other Ambulatory Visit: Payer: Self-pay | Admitting: Oncology

## 2021-01-21 NOTE — Telephone Encounter (Signed)
Per last note, continue for 5 years. Gardiner Rhyme, RN

## 2021-03-25 ENCOUNTER — Other Ambulatory Visit: Payer: Self-pay

## 2021-03-25 ENCOUNTER — Telehealth: Payer: Self-pay | Admitting: Adult Health

## 2021-03-25 DIAGNOSIS — D0511 Intraductal carcinoma in situ of right breast: Secondary | ICD-10-CM

## 2021-03-25 NOTE — Telephone Encounter (Signed)
Scheduled per 3/6 in basket, pt has been called and confirmed  ?

## 2021-03-26 ENCOUNTER — Inpatient Hospital Stay: Payer: PPO

## 2021-03-26 ENCOUNTER — Inpatient Hospital Stay: Payer: PPO | Admitting: Adult Health

## 2021-06-25 DIAGNOSIS — E782 Mixed hyperlipidemia: Secondary | ICD-10-CM | POA: Diagnosis not present

## 2021-06-25 DIAGNOSIS — K589 Irritable bowel syndrome without diarrhea: Secondary | ICD-10-CM | POA: Diagnosis not present

## 2021-06-25 DIAGNOSIS — M810 Age-related osteoporosis without current pathological fracture: Secondary | ICD-10-CM | POA: Diagnosis not present

## 2021-06-25 DIAGNOSIS — Z0001 Encounter for general adult medical examination with abnormal findings: Secondary | ICD-10-CM | POA: Diagnosis not present

## 2021-06-25 DIAGNOSIS — I10 Essential (primary) hypertension: Secondary | ICD-10-CM | POA: Diagnosis not present

## 2021-06-25 DIAGNOSIS — Z1331 Encounter for screening for depression: Secondary | ICD-10-CM | POA: Diagnosis not present

## 2021-06-25 DIAGNOSIS — I739 Peripheral vascular disease, unspecified: Secondary | ICD-10-CM | POA: Diagnosis not present

## 2021-06-25 DIAGNOSIS — I700 Atherosclerosis of aorta: Secondary | ICD-10-CM | POA: Diagnosis not present

## 2021-06-25 DIAGNOSIS — Z6822 Body mass index (BMI) 22.0-22.9, adult: Secondary | ICD-10-CM | POA: Diagnosis not present

## 2021-06-25 DIAGNOSIS — I701 Atherosclerosis of renal artery: Secondary | ICD-10-CM | POA: Diagnosis not present

## 2021-07-16 DIAGNOSIS — Z0001 Encounter for general adult medical examination with abnormal findings: Secondary | ICD-10-CM | POA: Diagnosis not present

## 2021-07-16 DIAGNOSIS — I10 Essential (primary) hypertension: Secondary | ICD-10-CM | POA: Diagnosis not present

## 2021-07-25 ENCOUNTER — Telehealth: Payer: Self-pay | Admitting: Adult Health

## 2021-07-25 NOTE — Telephone Encounter (Signed)
Rescheduled appointment per provider (meeting). Left voicemail regarding the appointment change.

## 2021-08-01 ENCOUNTER — Other Ambulatory Visit: Payer: PPO

## 2021-08-01 ENCOUNTER — Ambulatory Visit: Payer: PPO | Admitting: Adult Health

## 2021-08-05 ENCOUNTER — Encounter: Payer: Self-pay | Admitting: Oncology

## 2021-08-12 ENCOUNTER — Encounter: Payer: Self-pay | Admitting: Adult Health

## 2021-08-12 ENCOUNTER — Inpatient Hospital Stay: Payer: PPO | Attending: Adult Health

## 2021-08-12 ENCOUNTER — Inpatient Hospital Stay (HOSPITAL_BASED_OUTPATIENT_CLINIC_OR_DEPARTMENT_OTHER): Payer: PPO | Admitting: Adult Health

## 2021-08-12 ENCOUNTER — Other Ambulatory Visit: Payer: Self-pay

## 2021-08-12 VITALS — BP 115/57 | HR 84 | Temp 98.1°F | Resp 17 | Ht 60.0 in | Wt 112.0 lb

## 2021-08-12 DIAGNOSIS — D0512 Intraductal carcinoma in situ of left breast: Secondary | ICD-10-CM

## 2021-08-12 DIAGNOSIS — D0511 Intraductal carcinoma in situ of right breast: Secondary | ICD-10-CM

## 2021-08-12 DIAGNOSIS — Z86000 Personal history of in-situ neoplasm of breast: Secondary | ICD-10-CM | POA: Diagnosis not present

## 2021-08-12 DIAGNOSIS — M810 Age-related osteoporosis without current pathological fracture: Secondary | ICD-10-CM | POA: Diagnosis not present

## 2021-08-12 DIAGNOSIS — E2839 Other primary ovarian failure: Secondary | ICD-10-CM | POA: Diagnosis not present

## 2021-08-12 DIAGNOSIS — D649 Anemia, unspecified: Secondary | ICD-10-CM | POA: Insufficient documentation

## 2021-08-12 DIAGNOSIS — Z923 Personal history of irradiation: Secondary | ICD-10-CM | POA: Insufficient documentation

## 2021-08-12 DIAGNOSIS — Z87891 Personal history of nicotine dependence: Secondary | ICD-10-CM | POA: Insufficient documentation

## 2021-08-12 LAB — CMP (CANCER CENTER ONLY)
ALT: 7 U/L (ref 0–44)
AST: 10 U/L — ABNORMAL LOW (ref 15–41)
Albumin: 4 g/dL (ref 3.5–5.0)
Alkaline Phosphatase: 62 U/L (ref 38–126)
Anion gap: 6 (ref 5–15)
BUN: 7 mg/dL — ABNORMAL LOW (ref 8–23)
CO2: 29 mmol/L (ref 22–32)
Calcium: 9.3 mg/dL (ref 8.9–10.3)
Chloride: 94 mmol/L — ABNORMAL LOW (ref 98–111)
Creatinine: 0.67 mg/dL (ref 0.44–1.00)
GFR, Estimated: >60 mL/min (ref >60)
Glucose, Bld: 102 mg/dL — ABNORMAL HIGH (ref 70–99)
Potassium: 3.5 mmol/L (ref 3.5–5.1)
Sodium: 129 mmol/L — ABNORMAL LOW (ref 135–145)
Total Bilirubin: 0.3 mg/dL (ref 0.3–1.2)
Total Protein: 7.3 g/dL (ref 6.5–8.1)

## 2021-08-12 LAB — CBC WITH DIFFERENTIAL (CANCER CENTER ONLY)
Abs Immature Granulocytes: 0.04 10*3/uL (ref 0.00–0.07)
Basophils Absolute: 0 10*3/uL (ref 0.0–0.1)
Basophils Relative: 0 %
Eosinophils Absolute: 0 10*3/uL (ref 0.0–0.5)
Eosinophils Relative: 0 %
HCT: 30.4 % — ABNORMAL LOW (ref 36.0–46.0)
Hemoglobin: 10.3 g/dL — ABNORMAL LOW (ref 12.0–15.0)
Immature Granulocytes: 1 %
Lymphocytes Relative: 18 %
Lymphs Abs: 1.3 10*3/uL (ref 0.7–4.0)
MCH: 30.4 pg (ref 26.0–34.0)
MCHC: 33.9 g/dL (ref 30.0–36.0)
MCV: 89.7 fL (ref 80.0–100.0)
Monocytes Absolute: 0.5 10*3/uL (ref 0.1–1.0)
Monocytes Relative: 7 %
Neutro Abs: 5.4 10*3/uL (ref 1.7–7.7)
Neutrophils Relative %: 74 %
Platelet Count: 371 10*3/uL (ref 150–400)
RBC: 3.39 MIL/uL — ABNORMAL LOW (ref 3.87–5.11)
RDW: 14.8 % (ref 11.5–15.5)
WBC Count: 7.3 10*3/uL (ref 4.0–10.5)
nRBC: 0 % (ref 0.0–0.2)

## 2021-08-12 NOTE — Progress Notes (Signed)
[facility] Follow up:    [name], [name]   DIAGNOSIS:  Cancer Staging  Ductal carcinoma in situ (DCIS) of left breast Staging form: Breast, AJCC 8th Edition - Pathologic: Stage 0 (pTis (DCIS), pN0, cM0) - Unsigned   SUMMARY OF ONCOLOGIC HISTORY: Debra Henderson woman:   1)  status post  right lumpectomy April 2014 for a low-grade ductal carcinoma in situ. Focally close to the posterior margin at 0.1 cm. ER +100%, PR +100%.   2) Status post radiation therapy under the care of Dr. Pablo Ledger, completed 08/23/2012   3) decided against antiestrogens June 2015   4)  multiple comorbidities including peripheral vascular disease with stenting of the left iliac artery; hypertension; claudication; and a history of renal artery stenosis, with stenting of the left renal artery.   5) right breast upper inner quadrant biopsy 06/17/2013 showed a complex sclerosing lesion   6) status post left breast upper outer quadrant biopsy 12/25/2016 showing ductal carcinoma in situ, low-grade, estrogen and progesterone receptor positive.   7) left lumpectomy 02/06/2017 ductal carcinoma in situ, grade 1, measuring 1.0 cm, with negative margins   8) adjuvant radiation 04/02/17-04/30/17  Site/dose: 1) Left breast/ 40.05 Gy in 15 fractions 2) Left breast boost/ 12 Gy in 6 fractions   9) anastrozole started 06/01/2017             (a) bone density 07/15/2017 showed a T score of -2.7             (b) zoledronate started 12/24/2017, repeated yearly             (c) repeat bone density 12/02/2019 stable with T- 2.7  CURRENT THERAPY: Anastrozole/annual Zoledronate  INTERVAL HISTORY: Debra Henderson 76 y.o. female returns for f/u of her non invasive breast cancer.   She underwent bilateral diagnostic mammogram on 10/08/2020 that showed no evidence of malignancy and breast density category B. Her most recent bone density testing occurred on 12/01/2019 and  showed osteoprosis with a t score of -2.7 in the left femur and AP spine.    She is doing well today.  She is tolerating anastrozole well and has no concerns about it.  She denies any new issues today.     Patient Active Problem List   Diagnosis Date Noted   Osteoporosis 09/14/2017   Essential hypertension 02/04/2017   Hyperlipidemia 02/04/2017   Ductal carcinoma in situ (DCIS) of left breast 01/27/2017   Chronic bronchitis ([place]) 01/27/2017   Ductal carcinoma in situ (DCIS) of right breast 10/03/2013   PAD (peripheral artery disease) ([place]) 02/25/2012   Claudication in peripheral vascular disease.  Left lower extremity. 02/25/2012    is allergic to tetracyclines & related, vytorin [ezetimibe-simvastatin], chicken allergy, eggs or egg-derived products, erythromycin, lipitor [atorvastatin], penicillins, and sulfa antibiotics.  MEDICAL HISTORY: Past Medical History:  Diagnosis Date   Asthma    Breast cancer ([place]) 02/13/12   right upper outer- DCIS, ER/PR+   Cancer ([place]) 2014   breast cancer   Chronic kidney disease    renal artery stenosis   Claudication ([facility])    lower extremities   Complication of anesthesia    ' It does not work " I am difficult to put tpo sleep   Family history of anesthesia complication    my brother is difficult to put to sleep also   GERD (gastroesophageal reflux disease)    otc   History of renal stent  LEA DUPLEX, 03/16/2012 - LEFT EIA DISTAL/COMMON FEMORAL ARTERY-demonstrated occlusive disease   Hyperlipidemia    Hypertension    Incontinence    Peripheral vascular disease ([place])    prior stenting of left iliac artery   Personal history of radiation therapy 2014   right breast   Personal history of radiation therapy 2019   left breast   Radiation 08/02/12-08/23/12   Right breast 42.72 Gy x 16 fx   Renal artery stenosis ([facility])    RENAL DOPPLER, 02/12/2011 - RIGHT RENAL ARTERY 60-99% diameter reduction, LEFT RENAL ARTERY AT STENT 60-99% diameter  reduction    SURGICAL HISTORY: Past Surgical History:  Procedure Laterality Date   ANGIOPLASTY ILLIAC ARTERY  02/24/2012   Dr Gwenlyn Found  L iliac restent   ATHERECTOMY N/A 02/24/2012   Procedure: ATHERECTOMY;  Surgeon: Lorretta Harp, MD;  [facility] C[facility] LAB;  Service: Cardiovascular;  Laterality: N/A;   BLADDER SUSPENSION  1980's   bladder tack     BREAST BIOPSY Right 05/2013   BREAST BIOPSY Right 02/13/2012   BREAST BIOPSY Left 12/25/2016   BREAST LUMPECTOMY Right 05/02/2012   BREAST LUMPECTOMY Left 02/06/2017   BREAST LUMPECTOMY WITH NEEDLE LOCALIZATION Right 04/22/2012   Procedure: RIGHT BREAST WIRE LOCALIZATION  LUMPECTOMY ;  Surgeon: Merrie Roof, MD;  Location: [place];  Service: General;  Laterality: Right;   BREAST LUMPECTOMY WITH RADIOACTIVE SEED LOCALIZATION Left 02/06/2017   Procedure: BREAST LUMPECTOMY WITH RADIOACTIVE SEED LOCALIZATION;  Surgeon: Jovita Kussmaul, MD;  Location: [place];  Service: General;  Laterality: Left;   fibroid breast     left breast-adenoma benign 1980's   ILIAC ARTERY STENT  11/21/19/02   left    NM MYOVIEW LTD  03/21/2010   normal myocardial perfusion study   PERIPHERAL VASCULAR CATHETERIZATION Left 02/24/2012   Common iliac artery, 8x38 iCast Stent, resulting in a reduction from 80% in-stent restenosis to 0% residual   PERIPHERAL VASCULAR CATHETERIZATION Left 08/04/2006   Renal 90% stenosis, 3.5x 13 drug-eluting stent resulting in a reduction of 90% in-stent restenosis to 0% residual   PERIPHERAL VASCULAR CATHETERIZATION Left 05/22/2005   Renal 80% in-stent stenosis, 5x15 Aviator resulting in a reduction of 80% in-stent restenosis to 0% residual   PERIPHERAL VASCULAR CATHETERIZATION Left 02/22/2002   Renal in-stent restenosis, 5x15 Guidant rapid-exchange balloon resulting in reduction of a 95% in-stent restenosis to less than 20% residual   PERIPHERAL VASCULAR CATHETERIZATION Left 12/10/2000   95% renal stenosis, 6x77m Genesis Aviator  balloon/stent deployed at 10 atmospheres resulting in a reduction  of 95% stenosis to 0% residual; Common iliac artery stenosis, P12x4 mounted on a 7x2 Powerflex balloon resulting in a reduction of 80% to 0% residual   PERIPHERAL VASCULAR CATHETERIZATION Left 07/27/2000   95% renal stenosis, 776mx 6cm Smart stent resulting in a reduction of 90-95%  to 0% residual   stent /kidney  12/10/00,08/04/06   2002 R renal artery, 2008 L renal   TUBAL LIGATION     1980's    SOCIAL HISTORY: Social History   Socioeconomic History   Marital status: Married    Spouse name: Not on file   Number of children: Not on file   Years of education: Not on file   Highest education level: Not on file  Occupational History   Not on file  Tobacco Use   Smoking status: Former    Packs/day: 1.00    Types: Cigarettes    Quit date: 02/24/1988  Years since quitting: 33.4   Smokeless tobacco: Never   Tobacco comments:    quit 1990  Vaping Use   Vaping Use: Never used  Substance and Sexual Activity   Alcohol use: No   Drug use: No   Sexual activity: Never    Birth control/protection: Post-menopausal    Comment: menarche age 30, P11, menopause 26, no HRT  Other Topics Concern   Not on file  Social History Narrative   Not on file   Social Determinants of Health   Financial Resource Strain: Not on file  Food Insecurity: Not on file  Transportation Needs: Not on file  Physical Activity: Not on file  Stress: Not on file  Social Connections: Not on file  Intimate Partner Violence: Not on file    FAMILY HISTORY: Family History  Problem Relation Age of Onset   Cancer Mother        colon   COPD Mother    Cancer Father        brain   Stroke Sister    Heart attack Sister     Review of Systems  Constitutional:  Negative for appetite change, chills, fatigue, fever and unexpected weight change.  HENT:   Negative for hearing loss, lump/mass and trouble swallowing.   Eyes:  Negative for eye problems  and icterus.  Respiratory:  Negative for chest tightness, cough and shortness of breath.   Cardiovascular:  Negative for chest pain, leg swelling and palpitations.  Gastrointestinal:  Negative for abdominal distention, abdominal pain, constipation, diarrhea, nausea and vomiting.  Endocrine: Negative for hot flashes.  Genitourinary:  Negative for difficulty urinating.   Musculoskeletal:  Negative for arthralgias.  Skin:  Negative for itching and rash.  Neurological:  Negative for dizziness, extremity weakness, headaches and numbness.  Hematological:  Negative for adenopathy. Does not bruise/bleed easily.  Psychiatric/Behavioral:  Negative for depression. The patient is not nervous/anxious.       PHYSICAL EXAMINATION  ECOG PERFORMANCE STATUS: 1 - Symptomatic but completely ambulatory  Vitals:   08/12/21 1210  BP: (!) 115/57  Pulse: 84  Resp: 17  Temp: 98.1 F (36.7 C)  SpO2: 95%    Physical Exam Constitutional:      General: She is not in acute distress.    Appearance: Normal appearance. She is not toxic-appearing.  HENT:     Head: Normocephalic and atraumatic.  Eyes:     General: No scleral icterus. Cardiovascular:     Rate and Rhythm: Normal rate and regular rhythm.     Pulses: Normal pulses.     Heart sounds: Normal heart sounds.  Pulmonary:     Effort: Pulmonary effort is normal.     Breath sounds: Normal breath sounds.  Chest:     Comments: Left breast s/p lumpectomy and radiation, no sign of local recurrence, right breast s/p lumpectomy and radiation no sign of local recurrence.   Left breast does have a thickened overgrown keratosis on left breast Abdominal:     General: Abdomen is flat. Bowel sounds are normal. There is no distension.     Palpations: Abdomen is soft.     Tenderness: There is no abdominal tenderness.  Musculoskeletal:        General: No swelling.     Cervical back: Neck supple.  Lymphadenopathy:     Cervical: No cervical adenopathy.   Skin:    General: Skin is warm and dry.     Findings: No rash.  Neurological:     General:  No focal deficit present.     Mental Status: She is alert.  Psychiatric:        Mood and Affect: Mood normal.        Behavior: Behavior normal.     LABORATORY DATA:  CBC    Component Value Date/Time   WBC 7.3 08/12/2021 1155   WBC 7.7 12/26/2019 1338   RBC 3.39 (L) 08/12/2021 1155   HGB 10.3 (L) 08/12/2021 1155   HGB 13.4 10/03/2013 1504   HCT 30.4 (L) 08/12/2021 1155   HCT 40.2 10/03/2013 1504   PLT 371 08/12/2021 1155   PLT 277 10/03/2013 1504   MCV 89.7 08/12/2021 1155   MCV 98.0 10/03/2013 1504   MCH 30.4 08/12/2021 1155   MCHC 33.9 08/12/2021 1155   RDW 14.8 08/12/2021 1155   RDW 14.0 10/03/2013 1504   LYMPHSABS 1.3 08/12/2021 1155   LYMPHSABS 2.3 10/03/2013 1504   MONOABS 0.5 08/12/2021 1155   MONOABS 0.7 10/03/2013 1504   EOSABS 0.0 08/12/2021 1155   EOSABS 0.1 10/03/2013 1504   BASOSABS 0.0 08/12/2021 1155   BASOSABS 0.0 10/03/2013 1504    CMP     Component Value Date/Time   NA 129 (L) 08/12/2021 1155   NA 138 10/03/2013 1505   K 3.5 08/12/2021 1155   K 3.2 (L) 10/03/2013 1505   CL 94 (L) 08/12/2021 1155   CL 100 05/20/2012 1454   CO2 29 08/12/2021 1155   CO2 27 10/03/2013 1505   GLUCOSE 102 (H) 08/12/2021 1155   GLUCOSE 93 10/03/2013 1505   GLUCOSE 94 05/20/2012 1454   BUN 7 (L) 08/12/2021 1155   BUN 7.2 10/03/2013 1505   CREATININE 0.67 08/12/2021 1155   CREATININE 0.8 10/03/2013 1505   CALCIUM 9.3 08/12/2021 1155   CALCIUM 9.5 10/03/2013 1505   PROT 7.3 08/12/2021 1155   PROT 7.5 10/03/2013 1505   ALBUMIN 4.0 08/12/2021 1155   ALBUMIN 3.5 10/03/2013 1505   AST 10 (L) 08/12/2021 1155   AST 11 10/03/2013 1505   ALT 7 08/12/2021 1155   ALT 9 10/03/2013 1505   ALKPHOS 62 08/12/2021 1155   ALKPHOS 76 10/03/2013 1505   BILITOT 0.3 08/12/2021 1155   BILITOT 0.31 10/03/2013 1505   GFRNONAA >60 08/12/2021 1155   GFRAA >60 12/23/2018 1353       ASSESSMENT and THERAPY PLAN:   Ductal carcinoma in situ (DCIS) of left breast Debra Henderson is a 76 year old with h/o right breast DCIS diagnosed in 04/2012 followed by left breast DCIS diagnosed in 2018 s/p lumpectomy radiation and antiestrogen therapy with anastrozole that started 05/2017.    Debra Henderson has no clinical or radiographic sign of breast cancer recurrence.  She continues on Anastrozole with good tolerance.  She will be due for repeat mammogram in 09/2021 which I recommended she continue.  She is also due for bone density testing in 11/2021--I placed orders for this today.  She is receiving Zoledronate annually and will return in 1-2 weeks for this.    She has a decreased sodium and chloride level.  My nurse is faxing these results to Dr. Gerarda Fraction for his input.  Debra Henderson also has a normocytic anemia with a hemoglobin of 10.3.  I have ordered repeat labs for when she returns in 2 weeks to explore this further.  We will see her back in 1-2 weeks for labs and Zoledronate, and in 1 year and 2 weeks for her follow up.     All questions were answered.  The patient knows to call the clinic with any problems, questions or concerns. We can certainly see the patient much sooner if necessary.  Total encounter time:30 minutes*in face-to-face visit time, chart review, lab review, care coordination, order entry, and documentation of the encounter time.    Wilber Bihari, NP 08/12/21 1:00 PM Medical Oncology and Hematology [facility] [place], [place] [address] Tel. [phone]    Fax. [phone]  *Total Encounter Time as defined by the Centers for Medicare and Medicaid Services includes, in addition to the face-to-face time of a patient visit (documented in the note above) non-face-to-face time: obtaining and reviewing outside history, ordering and reviewing medications, tests or procedures, care coordination (communications with other health care professionals or  caregivers) and documentation in the medical record.

## 2021-08-12 NOTE — Assessment & Plan Note (Addendum)
Debra Henderson is a 76 year old with h/o right breast DCIS diagnosed in 04/2012 followed by left breast DCIS diagnosed in 2018 s/p lumpectomy radiation and antiestrogen therapy with anastrozole that started 05/2017.    Debra Henderson has no clinical or radiographic sign of breast cancer recurrence.  She continues on Anastrozole with good tolerance.  She will be due for repeat mammogram in 09/2021 which I recommended she continue.  She is also due for bone density testing in 11/2021--I placed orders for this today.  She is receiving Zoledronate annually and will return in 1-2 weeks for this.    She has a decreased sodium and chloride level.  My nurse is faxing these results to Dr. Gerarda Fraction for his input.  Debra Henderson also has a normocytic anemia with a hemoglobin of 10.3.  I have ordered repeat labs for when she returns in 2 weeks to explore this further.  We will see her back in 1-2 weeks for labs and Zoledronate, and in 1 year and 2 weeks for her follow up.

## 2021-08-13 ENCOUNTER — Telehealth: Payer: Self-pay | Admitting: Adult Health

## 2021-08-13 NOTE — Telephone Encounter (Signed)
Scheduled appointment per 7/24 los. Per [facility], she wants the patient to get infusion/lab in 1-2 weeks; however the patient will be out of town until 8/17. Patient was scheduled for lab/infusion on 8/22. Unable to schedule the patients infusion for 2024 due to the template/dates not being released at this time. Patient states she will call back in 6 months to get her infusion scheduled.

## 2021-08-23 DIAGNOSIS — E871 Hypo-osmolality and hyponatremia: Secondary | ICD-10-CM | POA: Diagnosis not present

## 2021-08-23 DIAGNOSIS — D649 Anemia, unspecified: Secondary | ICD-10-CM | POA: Diagnosis not present

## 2021-09-10 ENCOUNTER — Inpatient Hospital Stay: Payer: PPO | Admitting: Adult Health

## 2021-09-10 ENCOUNTER — Inpatient Hospital Stay: Payer: PPO | Attending: Adult Health

## 2021-09-10 ENCOUNTER — Other Ambulatory Visit: Payer: Self-pay

## 2021-09-10 VITALS — BP 112/62 | HR 72 | Temp 98.0°F | Resp 18

## 2021-09-10 DIAGNOSIS — M810 Age-related osteoporosis without current pathological fracture: Secondary | ICD-10-CM | POA: Insufficient documentation

## 2021-09-10 DIAGNOSIS — D0512 Intraductal carcinoma in situ of left breast: Secondary | ICD-10-CM | POA: Insufficient documentation

## 2021-09-10 DIAGNOSIS — M818 Other osteoporosis without current pathological fracture: Secondary | ICD-10-CM

## 2021-09-10 DIAGNOSIS — D649 Anemia, unspecified: Secondary | ICD-10-CM

## 2021-09-10 DIAGNOSIS — Z79899 Other long term (current) drug therapy: Secondary | ICD-10-CM | POA: Diagnosis not present

## 2021-09-10 DIAGNOSIS — E2839 Other primary ovarian failure: Secondary | ICD-10-CM

## 2021-09-10 LAB — CBC WITH DIFFERENTIAL (CANCER CENTER ONLY)
Abs Immature Granulocytes: 0.03 10*3/uL (ref 0.00–0.07)
Basophils Absolute: 0 10*3/uL (ref 0.0–0.1)
Basophils Relative: 0 %
Eosinophils Absolute: 0 10*3/uL (ref 0.0–0.5)
Eosinophils Relative: 0 %
HCT: 31.7 % — ABNORMAL LOW (ref 36.0–46.0)
Hemoglobin: 10.9 g/dL — ABNORMAL LOW (ref 12.0–15.0)
Immature Granulocytes: 0 %
Lymphocytes Relative: 22 %
Lymphs Abs: 1.6 10*3/uL (ref 0.7–4.0)
MCH: 29.8 pg (ref 26.0–34.0)
MCHC: 34.4 g/dL (ref 30.0–36.0)
MCV: 86.6 fL (ref 80.0–100.0)
Monocytes Absolute: 0.7 10*3/uL (ref 0.1–1.0)
Monocytes Relative: 10 %
Neutro Abs: 4.6 10*3/uL (ref 1.7–7.7)
Neutrophils Relative %: 68 %
Platelet Count: 372 10*3/uL (ref 150–400)
RBC: 3.66 MIL/uL — ABNORMAL LOW (ref 3.87–5.11)
RDW: 14.8 % (ref 11.5–15.5)
WBC Count: 7 10*3/uL (ref 4.0–10.5)
nRBC: 0 % (ref 0.0–0.2)

## 2021-09-10 LAB — CMP (CANCER CENTER ONLY)
ALT: 8 U/L (ref 0–44)
AST: 11 U/L — ABNORMAL LOW (ref 15–41)
Albumin: 4.2 g/dL (ref 3.5–5.0)
Alkaline Phosphatase: 60 U/L (ref 38–126)
Anion gap: 5 (ref 5–15)
BUN: 12 mg/dL (ref 8–23)
CO2: 29 mmol/L (ref 22–32)
Calcium: 9.6 mg/dL (ref 8.9–10.3)
Chloride: 92 mmol/L — ABNORMAL LOW (ref 98–111)
Creatinine: 0.72 mg/dL (ref 0.44–1.00)
GFR, Estimated: >60 mL/min (ref >60)
Glucose, Bld: 93 mg/dL (ref 70–99)
Potassium: 4 mmol/L (ref 3.5–5.1)
Sodium: 126 mmol/L — ABNORMAL LOW (ref 135–145)
Total Bilirubin: 0.3 mg/dL (ref 0.3–1.2)
Total Protein: 7.3 g/dL (ref 6.5–8.1)

## 2021-09-10 LAB — FOLATE: Folate: 16.7 ng/mL (ref >5.9)

## 2021-09-10 LAB — VITAMIN B12: Vitamin B-12: 178 pg/mL — ABNORMAL LOW (ref 180–914)

## 2021-09-10 LAB — IRON AND IRON BINDING CAPACITY (CC-WL,HP ONLY)
Iron: 443 ug/dL — ABNORMAL HIGH (ref 28–170)
Saturation Ratios: 92 % — ABNORMAL HIGH (ref 10.4–31.8)
TIBC: 480 ug/dL — ABNORMAL HIGH (ref 250–450)
UIBC: 37 ug/dL — ABNORMAL LOW (ref 148–442)

## 2021-09-10 LAB — RETIC PANEL
Immature Retic Fract: 11.6 % (ref 2.3–15.9)
RBC.: 3.68 MIL/uL — ABNORMAL LOW (ref 3.87–5.11)
Retic Count, Absolute: 54.8 10*3/uL (ref 19.0–186.0)
Retic Ct Pct: 1.5 % (ref 0.4–3.1)
Reticulocyte Hemoglobin: 33.3 pg (ref >27.9)

## 2021-09-10 MED ORDER — ZOLEDRONIC ACID 4 MG/100ML IV SOLN
4.0000 mg | Freq: Once | INTRAVENOUS | Status: AC
  Administered 2021-09-10: 4 mg via INTRAVENOUS
  Filled 2021-09-10: qty 100

## 2021-09-10 MED ORDER — SODIUM CHLORIDE 0.9 % IV SOLN: INTRAVENOUS | Status: DC

## 2021-09-10 NOTE — Patient Instructions (Signed)
Zoledronic Acid injection (Hypercalcemia, Oncology) What is this medicine? ZOLEDRONIC ACID (ZOE le dron ik AS id) lowers the amount of calcium loss from bone. It is used to treat too much calcium in your blood from cancer. It is also used to prevent complications of cancer that has spread to the bone. This medicine may be used for other purposes; ask your health care provider or pharmacist if you have questions. COMMON BRAND NAME(S): Zometa What should I tell my health care provider before I take this medicine? They need to know if you have any of these conditions:  aspirin-sensitive asthma  cancer, especially if you are receiving medicines used to treat cancer  dental disease or wear dentures  infection  kidney disease  receiving corticosteroids like dexamethasone or prednisone  an unusual or allergic reaction to zoledronic acid, other medicines, foods, dyes, or preservatives  pregnant or trying to get pregnant  breast-feeding How should I use this medicine? This medicine is for infusion into a vein. It is given by a health care professional in a hospital or clinic setting. Talk to your pediatrician regarding the use of this medicine in children. Special care may be needed. Overdosage: If you think you have taken too much of this medicine contact a poison control center or emergency room at once. NOTE: This medicine is only for you. Do not share this medicine with others. What if I miss a dose? It is important not to miss your dose. Call your doctor or health care professional if you are unable to keep an appointment. What may interact with this medicine?  certain antibiotics given by injection  NSAIDs, medicines for pain and inflammation, like ibuprofen or naproxen  some diuretics like bumetanide, furosemide  teriparatide  thalidomide This list may not describe all possible interactions. Give your health care provider a list of all the medicines, herbs, non-prescription  drugs, or dietary supplements you use. Also tell them if you smoke, drink alcohol, or use illegal drugs. Some items may interact with your medicine. What should I watch for while using this medicine? Visit your doctor or health care professional for regular checkups. It may be some time before you see the benefit from this medicine. Do not stop taking your medicine unless your doctor tells you to. Your doctor may order blood tests or other tests to see how you are doing. Women should inform their doctor if they wish to become pregnant or think they might be pregnant. There is a potential for serious side effects to an unborn child. Talk to your health care professional or pharmacist for more information. You should make sure that you get enough calcium and vitamin D while you are taking this medicine. Discuss the foods you eat and the vitamins you take with your health care professional. Some people who take this medicine have severe bone, joint, and/or muscle pain. This medicine may also increase your risk for jaw problems or a broken thigh bone. Tell your doctor right away if you have severe pain in your jaw, bones, joints, or muscles. Tell your doctor if you have any pain that does not go away or that gets worse. Tell your dentist and dental surgeon that you are taking this medicine. You should not have major dental surgery while on this medicine. See your dentist to have a dental exam and fix any dental problems before starting this medicine. Take good care of your teeth while on this medicine. Make sure you see your dentist for regular follow-up   appointments. What side effects may I notice from receiving this medicine? Side effects that you should report to your doctor or health care professional as soon as possible:  allergic reactions like skin rash, itching or hives, swelling of the face, lips, or tongue  anxiety, confusion, or depression  breathing problems  changes in vision  eye  pain  feeling faint or lightheaded, falls  jaw pain, especially after dental work  mouth sores  muscle cramps, stiffness, or weakness  redness, blistering, peeling or loosening of the skin, including inside the mouth  trouble passing urine or change in the amount of urine Side effects that usually do not require medical attention (report to your doctor or health care professional if they continue or are bothersome):  bone, joint, or muscle pain  constipation  diarrhea  fever  hair loss  irritation at site where injected  loss of appetite  nausea, vomiting  stomach upset  trouble sleeping  trouble swallowing  weak or tired This list may not describe all possible side effects. Call your doctor for medical advice about side effects. You may report side effects to FDA at 1-800-FDA-1088. Where should I keep my medicine? This drug is given in a hospital or clinic and will not be stored at home. NOTE: This sheet is a summary. It may not cover all possible information. If you have questions about this medicine, talk to your doctor, pharmacist, or health care provider.  2020 Elsevier/Gold Standard (2013-06-04 14:19:39)  

## 2021-09-11 LAB — FERRITIN: Ferritin: 55 ng/mL (ref 11–307)

## 2021-09-16 ENCOUNTER — Encounter: Payer: Self-pay | Admitting: Adult Health

## 2021-09-16 NOTE — Progress Notes (Signed)
This encounter was created in error - please disregard.

## 2021-09-17 ENCOUNTER — Other Ambulatory Visit: Payer: Self-pay | Admitting: Adult Health

## 2021-09-17 ENCOUNTER — Inpatient Hospital Stay (HOSPITAL_BASED_OUTPATIENT_CLINIC_OR_DEPARTMENT_OTHER): Payer: PPO | Admitting: Adult Health

## 2021-09-17 DIAGNOSIS — E538 Deficiency of other specified B group vitamins: Secondary | ICD-10-CM | POA: Diagnosis not present

## 2021-09-17 DIAGNOSIS — D0512 Intraductal carcinoma in situ of left breast: Secondary | ICD-10-CM

## 2021-09-17 DIAGNOSIS — Z1231 Encounter for screening mammogram for malignant neoplasm of breast: Secondary | ICD-10-CM

## 2021-09-17 NOTE — Progress Notes (Signed)
[facility] Follow up:    Debra Henderson, Debra Henderson [id number]   DIAGNOSIS:  Cancer Staging  Ductal carcinoma in situ (DCIS) of left breast Staging form: Breast, AJCC 8th Edition - Pathologic: Stage 0 (pTis (DCIS), pN0, cM0) - Unsigned I connected with Debra Henderson on 09/21/21 at  2:45 PM EDT by telephone and verified that I am speaking with the correct person using two identifiers.  I discussed the limitations, risks, security and privacy concerns of performing an evaluation and management service by telephone and the availability of in person appointments.  I also discussed with the patient that there may be a patient responsible charge related to this service. The patient expressed understanding and agreed to proceed.  Patient location: home Provider location: [facility] provider office  SUMMARY OF ONCOLOGIC HISTORY: Debra Henderson woman:   1)  status post  right lumpectomy April 2014 for a low-grade ductal carcinoma in situ. Focally close to the posterior margin at 0.1 cm. ER +100%, PR +100%.   2) Status post radiation therapy under the care of Dr. Pablo Ledger, completed 08/23/2012   3) decided against antiestrogens June 2015   4)  multiple comorbidities including peripheral vascular disease with stenting of the left iliac artery; hypertension; claudication; and a history of renal artery stenosis, with stenting of the left renal artery.   5) right breast upper inner quadrant biopsy 06/17/2013 showed a complex sclerosing lesion   6) status post left breast upper outer quadrant biopsy 12/25/2016 showing ductal carcinoma in situ, low-grade, estrogen and progesterone receptor positive.   7) left lumpectomy 02/06/2017 ductal carcinoma in situ, grade 1, measuring 1.0 cm, with negative margins   8) adjuvant radiation 04/02/17-04/30/17  Site/dose: 1) Left breast/ 40.05 Gy in 15 fractions 2) Left breast boost/ 12 Gy in 6 fractions   9) anastrozole  started 06/01/2017             (a) bone density 07/15/2017 showed a T score of -2.7             (b) zoledronate started 12/24/2017, repeated yearly             (c) repeat bone density 12/02/2019 stable with T- 2.7    CURRENT THERAPY: Anastrozole/zoledronate  INTERVAL HISTORY: Debra Henderson 76 y.o. female returns for f/u and to discuss her results from her lab testing.  She has a vitamin b12 level of 178.  She denies any ETOH intake.  She is fatigued.  She denies any other issues, and received Zoledronate last week without any issues.    Her CMP was forwarded to her PCP due to hyponatremia for awareness and evaluation if any meds they are prescribing could be related.        Patient Active Problem List   Diagnosis Date Noted   B12 deficiency 09/21/2021   Osteoporosis 09/14/2017   Essential hypertension 02/04/2017   Hyperlipidemia 02/04/2017   Ductal carcinoma in situ (DCIS) of left breast 01/27/2017   Chronic bronchitis ([place]) 01/27/2017   Ductal carcinoma in situ (DCIS) of right breast 10/03/2013   PAD (peripheral artery disease) ([place]) 02/25/2012   Claudication in peripheral vascular disease.  Left lower extremity. 02/25/2012    is allergic to tetracyclines & related, vytorin [ezetimibe-simvastatin], chicken allergy, eggs or egg-derived products, erythromycin, lipitor [atorvastatin], penicillins, and sulfa antibiotics.  MEDICAL HISTORY: Past Medical History:  Diagnosis Date   Asthma    Breast cancer ([place]) 02/13/12   right upper outer- DCIS, ER/PR+  Cancer [facility]) 2014   breast cancer   Chronic kidney disease    renal artery stenosis   Claudication [facility])    lower extremities   Complication of anesthesia    ' It does not work " I am difficult to put tpo sleep   Family history of anesthesia complication    my brother is difficult to put to sleep also   GERD (gastroesophageal reflux disease)    otc   History of renal stent    LEA DUPLEX, 03/16/2012 - LEFT EIA  DISTAL/COMMON FEMORAL ARTERY-demonstrated occlusive disease   Hyperlipidemia    Hypertension    Incontinence    Peripheral vascular disease ([place])    prior stenting of left iliac artery   Personal history of radiation therapy 2014   right breast   Personal history of radiation therapy 2019   left breast   Radiation 08/02/12-08/23/12   Right breast 42.72 Gy x 16 fx   Renal artery stenosis ([facility])    RENAL DOPPLER, 02/12/2011 - RIGHT RENAL ARTERY 60-99% diameter reduction, LEFT RENAL ARTERY AT STENT 60-99% diameter reduction    SURGICAL HISTORY: Past Surgical History:  Procedure Laterality Date   ANGIOPLASTY ILLIAC ARTERY  02/24/2012   Dr Gwenlyn Found  L iliac restent   ATHERECTOMY N/A 02/24/2012   Procedure: ATHERECTOMY;  Surgeon: Lorretta Harp, MD;  Location: [facility] CATH LAB;  Service: Cardiovascular;  Laterality: N/A;   BLADDER SUSPENSION  1980's   bladder tack     BREAST BIOPSY Right 05/2013   BREAST BIOPSY Right 02/13/2012   BREAST BIOPSY Left 12/25/2016   BREAST LUMPECTOMY Right 05/02/2012   BREAST LUMPECTOMY Left 02/06/2017   BREAST LUMPECTOMY WITH NEEDLE LOCALIZATION Right 04/22/2012   Procedure: RIGHT BREAST WIRE LOCALIZATION  LUMPECTOMY ;  Surgeon: Merrie Roof, MD;  Location: [place];  Service: General;  Laterality: Right;   BREAST LUMPECTOMY WITH RADIOACTIVE SEED LOCALIZATION Left 02/06/2017   Procedure: BREAST LUMPECTOMY WITH RADIOACTIVE SEED LOCALIZATION;  Surgeon: Jovita Kussmaul, MD;  Location: [place];  Service: General;  Laterality: Left;   fibroid breast     left breast-adenoma benign 1980's   ILIAC ARTERY STENT  11/21/19/02   left    NM MYOVIEW LTD  03/21/2010   normal myocardial perfusion study   PERIPHERAL VASCULAR CATHETERIZATION Left 02/24/2012   Common iliac artery, 8x38 iCast Stent, resulting in a reduction from 80% in-stent restenosis to 0% residual   PERIPHERAL VASCULAR CATHETERIZATION Left 08/04/2006   Renal 90% stenosis, 3.5x 13 drug-eluting stent  resulting in a reduction of 90% in-stent restenosis to 0% residual   PERIPHERAL VASCULAR CATHETERIZATION Left 05/22/2005   Renal 80% in-stent stenosis, 5x15 Aviator resulting in a reduction of 80% in-stent restenosis to 0% residual   PERIPHERAL VASCULAR CATHETERIZATION Left 02/22/2002   Renal in-stent restenosis, 5x15 Guidant rapid-exchange balloon resulting in reduction of a 95% in-stent restenosis to less than 20% residual   PERIPHERAL VASCULAR CATHETERIZATION Left 12/10/2000   95% renal stenosis, 6x53m Genesis Aviator balloon/stent deployed at 10 atmospheres resulting in a reduction  of 95% stenosis to 0% residual; Common iliac artery stenosis, P12x4 mounted on a 7x2 Powerflex balloon resulting in a reduction of 80% to 0% residual   PERIPHERAL VASCULAR CATHETERIZATION Left 07/27/2000   95% renal stenosis, 78mx 6cm Smart stent resulting in a reduction of 90-95%  to 0% residual   stent /kidney  12/10/00,08/04/06   2002 R renal artery, 2008 L renal   TUBAL LIGATION  1980's    SOCIAL HISTORY: Social History   Socioeconomic History   Marital status: Married    Spouse name: Not on file   Number of children: Not on file   Years of education: Not on file   Highest education level: Not on file  Occupational History   Not on file  Tobacco Use   Smoking status: Former    Packs/day: 1.00    Types: Cigarettes    Quit date: 02/24/1988    Years since quitting: 33.5   Smokeless tobacco: Never   Tobacco comments:    quit 1990  Vaping Use   Vaping Use: Never used  Substance and Sexual Activity   Alcohol use: No   Drug use: No   Sexual activity: Never    Birth control/protection: Post-menopausal    Comment: menarche age 29, P31, menopause 14, no HRT  Other Topics Concern   Not on file  Social History Narrative   Not on file   Social Determinants of Health   Financial Resource Strain: Not on file  Food Insecurity: Not on file  Transportation Needs: Not on file  Physical Activity: Not  on file  Stress: Not on file  Social Connections: Not on file  Intimate Partner Violence: Not on file    FAMILY HISTORY: Family History  Problem Relation Age of Onset   Cancer Mother        colon   COPD Mother    Cancer Father        brain   Stroke Sister    Heart attack Sister     Review of Systems  Constitutional:  Positive for fatigue. Negative for appetite change, chills, fever and unexpected weight change.  HENT:   Negative for hearing loss, lump/mass and trouble swallowing.   Eyes:  Negative for eye problems and icterus.  Respiratory:  Negative for chest tightness, cough and shortness of breath.   Cardiovascular:  Negative for chest pain, leg swelling and palpitations.  Gastrointestinal:  Negative for abdominal distention, abdominal pain, constipation, diarrhea, nausea and vomiting.  Endocrine: Negative for hot flashes.  Genitourinary:  Negative for difficulty urinating.   Musculoskeletal:  Negative for arthralgias.  Skin:  Negative for itching and rash.  Neurological:  Negative for dizziness, extremity weakness, headaches and numbness.  Hematological:  Negative for adenopathy. Does not bruise/bleed easily.  Psychiatric/Behavioral:  Negative for depression. The patient is not nervous/anxious.       PHYSICAL EXAMINATION  ECOG PERFORMANCE STATUS: 1 - Symptomatic but completely ambulatory Patient sounds well.  She is in no apparent distress, mood and behavior are normal.    LABORATORY DATA:  CBC    Component Value Date/Time   WBC 7.0 09/10/2021 1440   WBC 7.7 12/26/2019 1338   RBC 3.66 (L) 09/10/2021 1440   RBC 3.68 (L) 09/10/2021 1440   HGB 10.9 (L) 09/10/2021 1440   HGB 13.4 10/03/2013 1504   HCT 31.7 (L) 09/10/2021 1440   HCT 40.2 10/03/2013 1504   PLT 372 09/10/2021 1440   PLT 277 10/03/2013 1504   MCV 86.6 09/10/2021 1440   MCV 98.0 10/03/2013 1504   MCH 29.8 09/10/2021 1440   MCHC 34.4 09/10/2021 1440   RDW 14.8 09/10/2021 1440   RDW 14.0  10/03/2013 1504   LYMPHSABS 1.6 09/10/2021 1440   LYMPHSABS 2.3 10/03/2013 1504   MONOABS 0.7 09/10/2021 1440   MONOABS 0.7 10/03/2013 1504   EOSABS 0.0 09/10/2021 1440   EOSABS 0.1 10/03/2013 1504   BASOSABS 0.0  09/10/2021 1440   BASOSABS 0.0 10/03/2013 1504    CMP     Component Value Date/Time   NA 126 (L) 09/10/2021 1440   NA 138 10/03/2013 1505   K 4.0 09/10/2021 1440   K 3.2 (L) 10/03/2013 1505   CL 92 (L) 09/10/2021 1440   CL 100 05/20/2012 1454   CO2 29 09/10/2021 1440   CO2 27 10/03/2013 1505   GLUCOSE 93 09/10/2021 1440   GLUCOSE 93 10/03/2013 1505   GLUCOSE 94 05/20/2012 1454   BUN 12 09/10/2021 1440   BUN 7.2 10/03/2013 1505   CREATININE 0.72 09/10/2021 1440   CREATININE 0.8 10/03/2013 1505   CALCIUM 9.6 09/10/2021 1440   CALCIUM 9.5 10/03/2013 1505   PROT 7.3 09/10/2021 1440   PROT 7.5 10/03/2013 1505   ALBUMIN 4.2 09/10/2021 1440   ALBUMIN 3.5 10/03/2013 1505   AST 11 (L) 09/10/2021 1440   AST 11 10/03/2013 1505   ALT 8 09/10/2021 1440   ALT 9 10/03/2013 1505   ALKPHOS 60 09/10/2021 1440   ALKPHOS 76 10/03/2013 1505   BILITOT 0.3 09/10/2021 1440   BILITOT 0.31 10/03/2013 1505   GFRNONAA >60 09/10/2021 1440   GFRAA >60 12/23/2018 1353      ASSESSMENT and THERAPY PLAN:   Ductal carcinoma in situ (DCIS) of left breast Doing well--no signs of recurrence.  Will continue Anastrozole daily and we will see her back in 08/2022 for labs, f/u and Zoledronate.    Follow up instructions:    -Return to cancer center 08/2022 for labs, f/u, and Zoledronate    The patient was provided an opportunity to ask questions and all were answered. The patient agreed with the plan and demonstrated an understanding of the instructions.   The patient was advised to call back or seek an in-person evaluation if the symptoms worsen or if the condition fails to improve as anticipated.   I provided 11 minutes of non face-to-face telephone visit time during this encounter, and  > 50% was spent counseling as documented under my assessment & plan.  Wilber Bihari, NP 09/21/21 1:17 PM Medical Oncology and Hematology [facility] [address], [place] [address] Tel. [phone]    Fax. [phone]

## 2021-09-21 ENCOUNTER — Encounter: Payer: Self-pay | Admitting: Adult Health

## 2021-09-21 DIAGNOSIS — E538 Deficiency of other specified B group vitamins: Secondary | ICD-10-CM | POA: Insufficient documentation

## 2021-09-21 NOTE — Assessment & Plan Note (Signed)
Doing well--no signs of recurrence.  Will continue Anastrozole daily and we will see her back in 08/2022 for labs, f/u and Zoledronate.

## 2021-10-09 ENCOUNTER — Ambulatory Visit
Admission: RE | Admit: 2021-10-09 | Discharge: 2021-10-09 | Disposition: A | Discharge status: Home / Self Care | Payer: PPO | Source: Ambulatory Visit | Attending: Adult Health | Admitting: Adult Health

## 2021-10-09 DIAGNOSIS — Z1231 Encounter for screening mammogram for malignant neoplasm of breast: Secondary | ICD-10-CM | POA: Diagnosis not present

## 2021-11-13 IMAGING — DX DG WRIST COMPLETE 3+V*R*
4 series · 4 of 4 positions shown · non-contrast
Comparison: None.

CLINICAL DATA: Fall, right wrist pain

EXAM:
RIGHT WRIST - COMPLETE 3+ VIEW

[wrist pa]
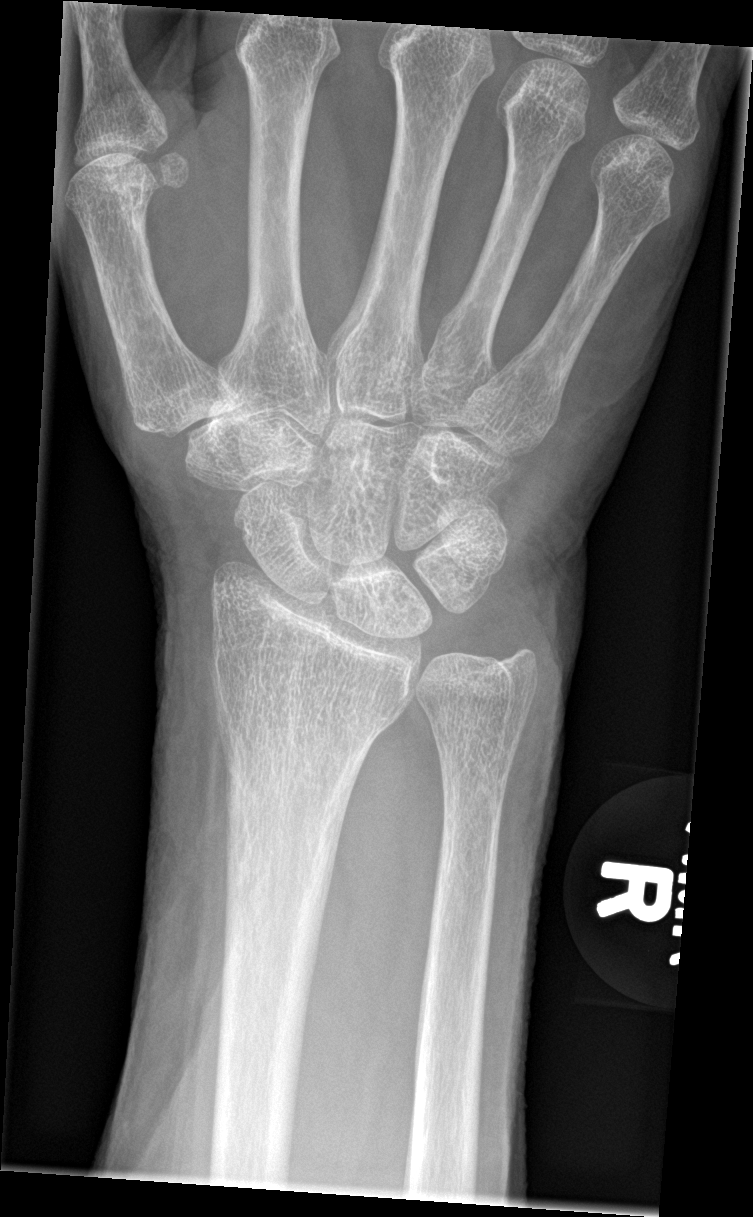

[wrist obl]
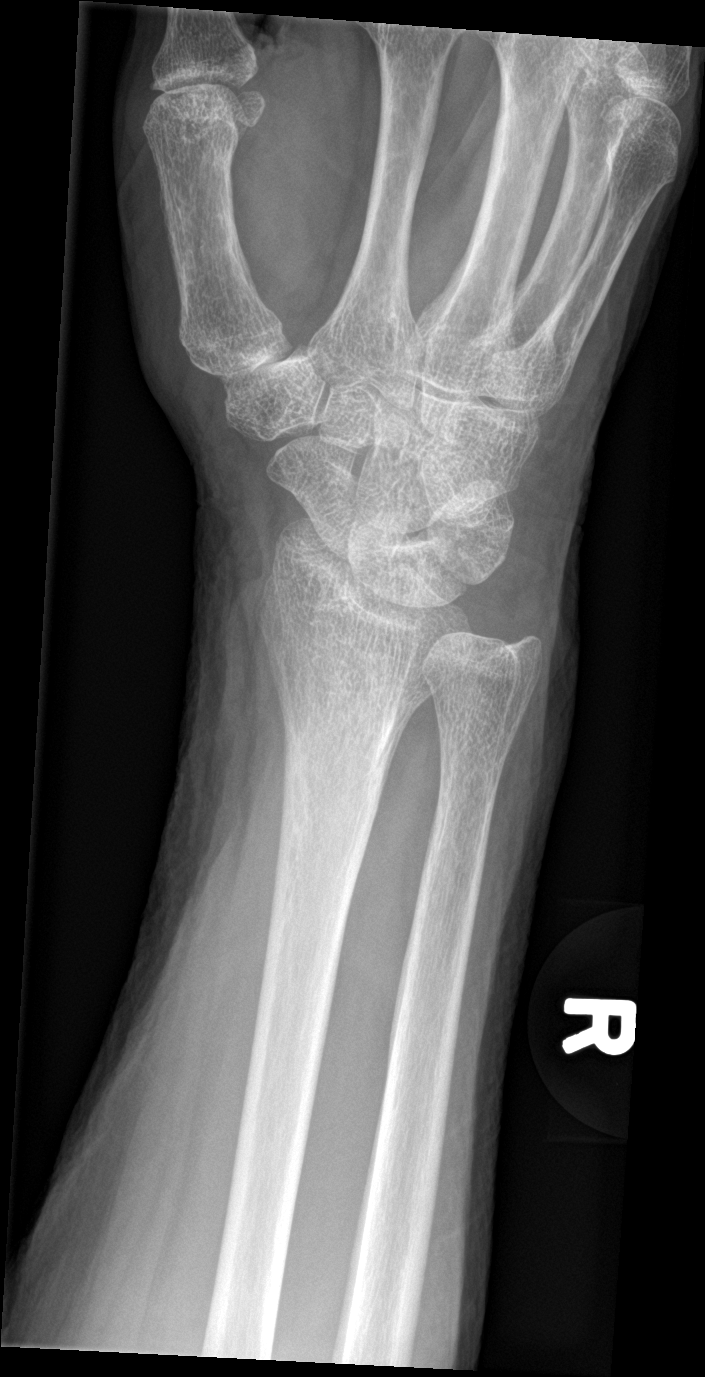

[wrist lat]
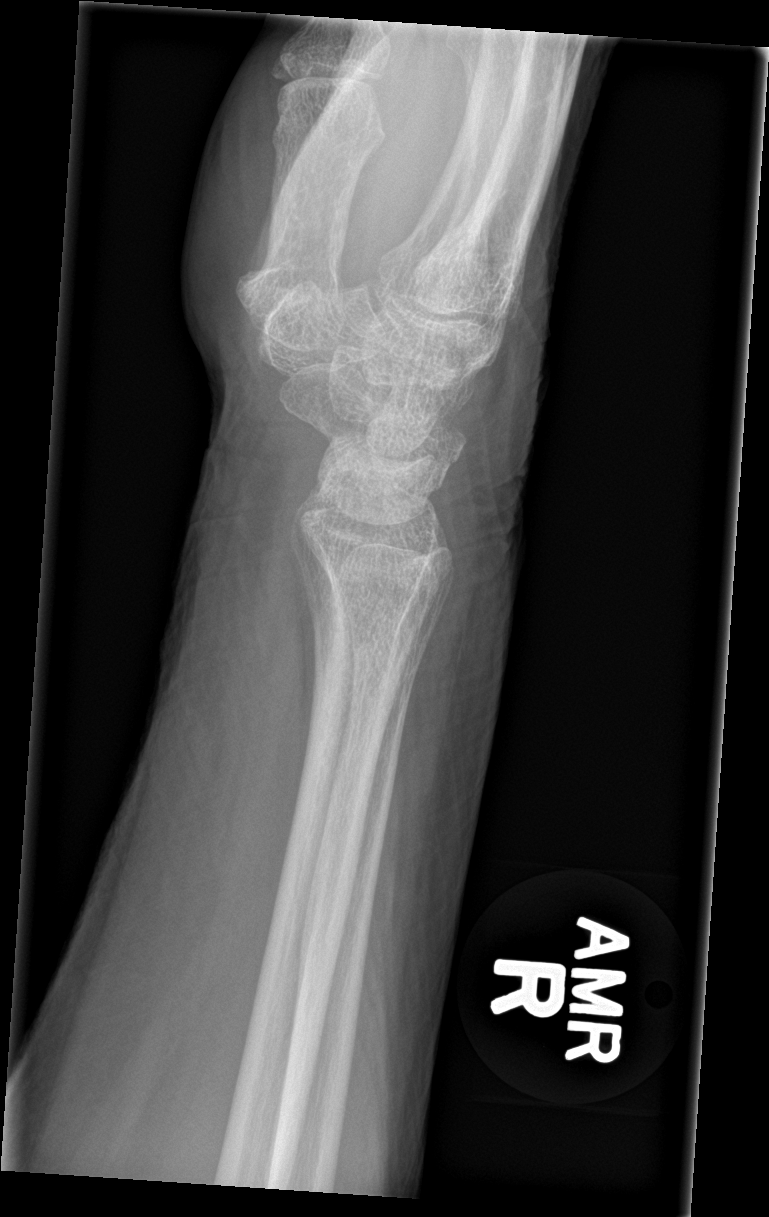

[wrist navicular]
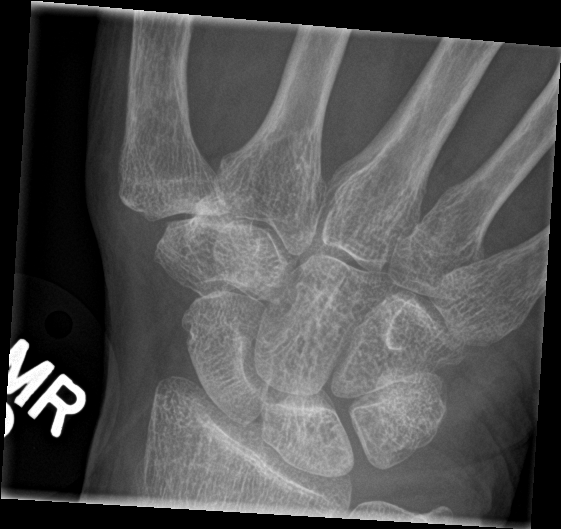

[4 of 4 positions shown; findings below may reference images not displayed]

FINDINGS: There is no evidence of fracture or dislocation. Carpal bones and
carpal intraosseous spaces intact. Mild osteoarthritis of the first
CMC joint. Mild soft tissue swelling about the wrist.
IMPRESSION: Mild soft tissue swelling about the wrist without acute fracture or
dislocation.

## 2021-11-13 IMAGING — DX DG ELBOW COMPLETE 3+V*R*
4 series · 4 of 4 positions shown · non-contrast
Comparison: None.

CLINICAL DATA: Pain following fall

EXAM:
RIGHT ELBOW - COMPLETE 3+ VIEW

[elbow ap]
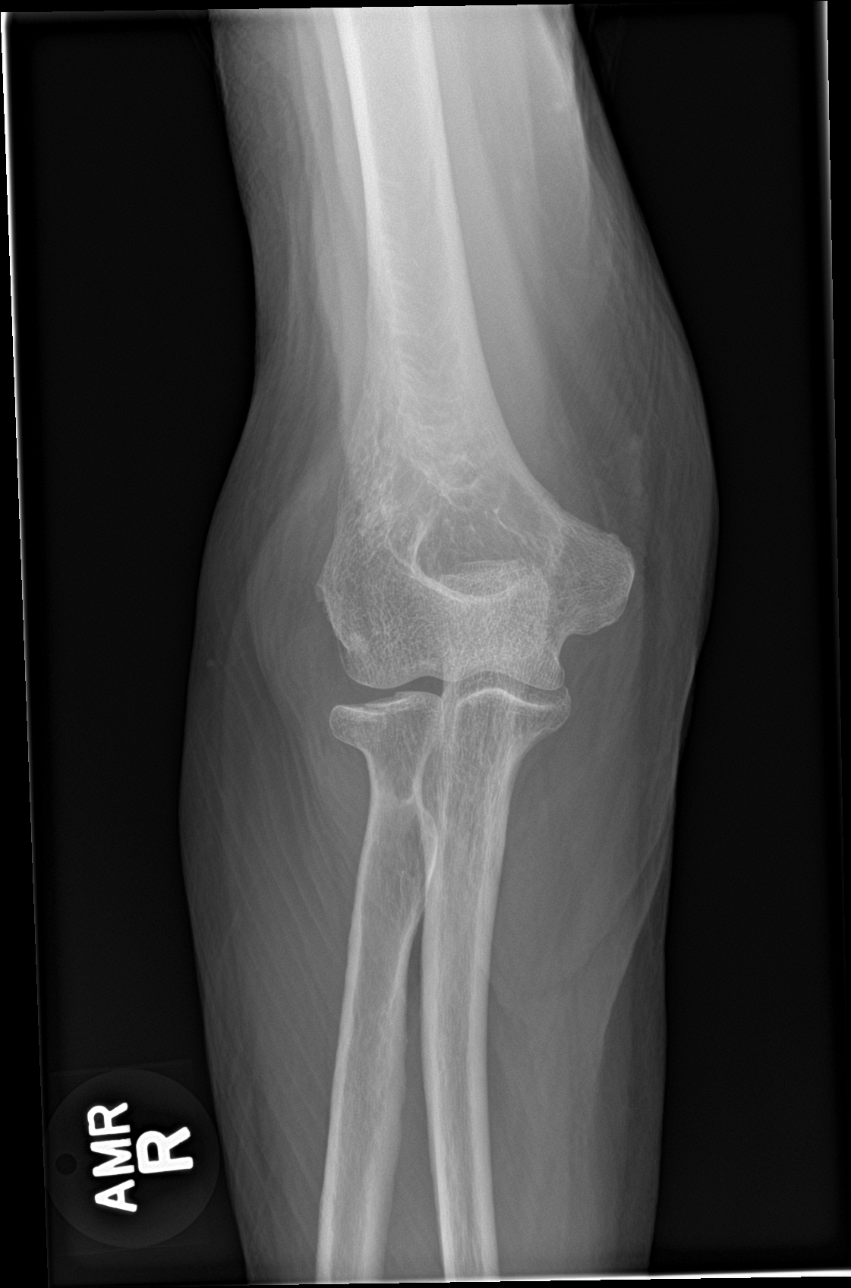

[elbow obl (1 of 2)]
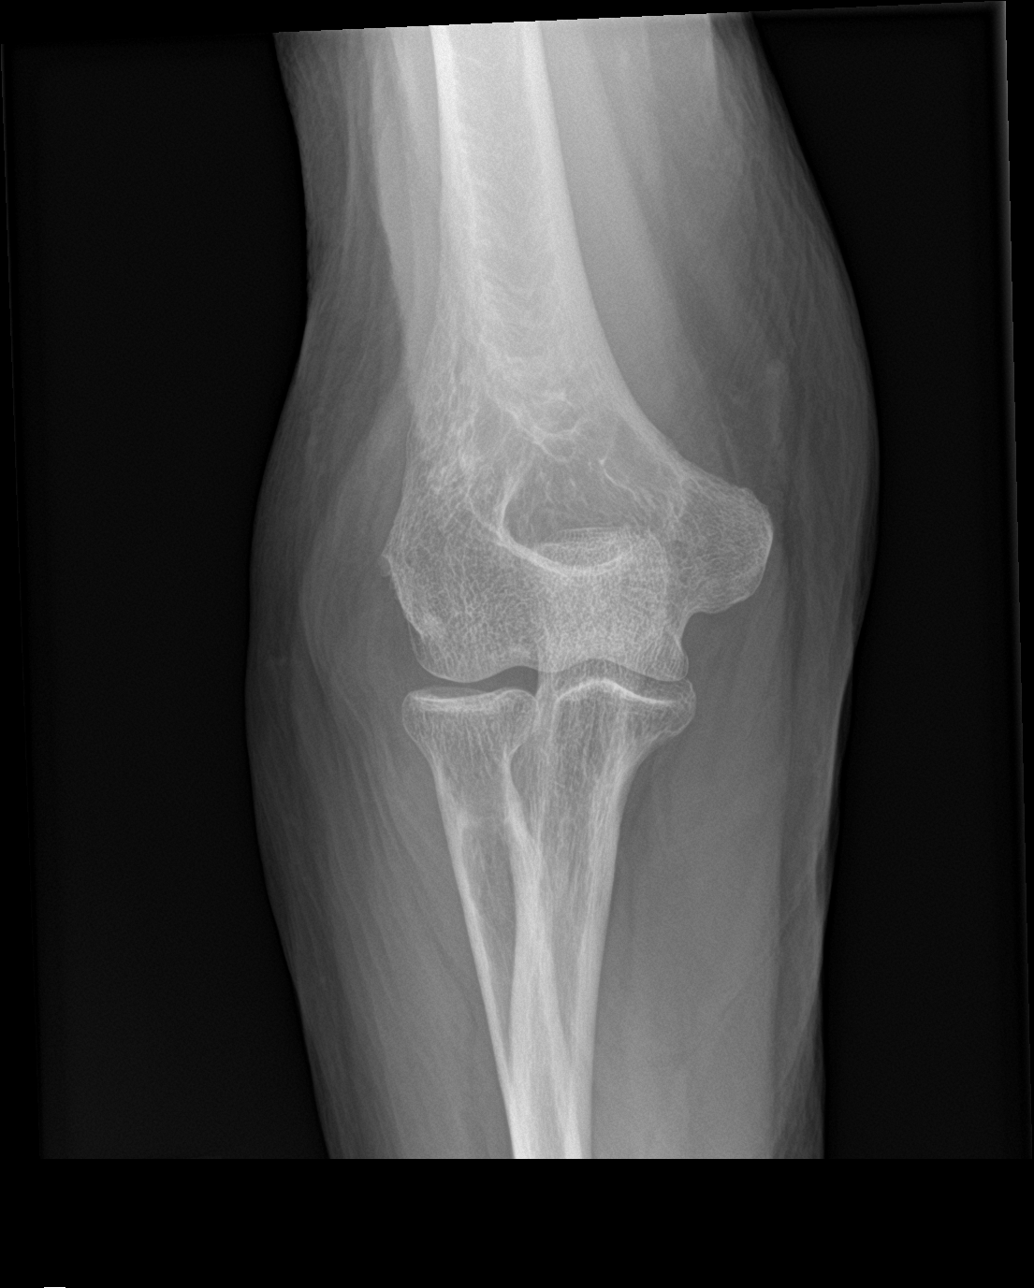

[elbow obl (2 of 2)]
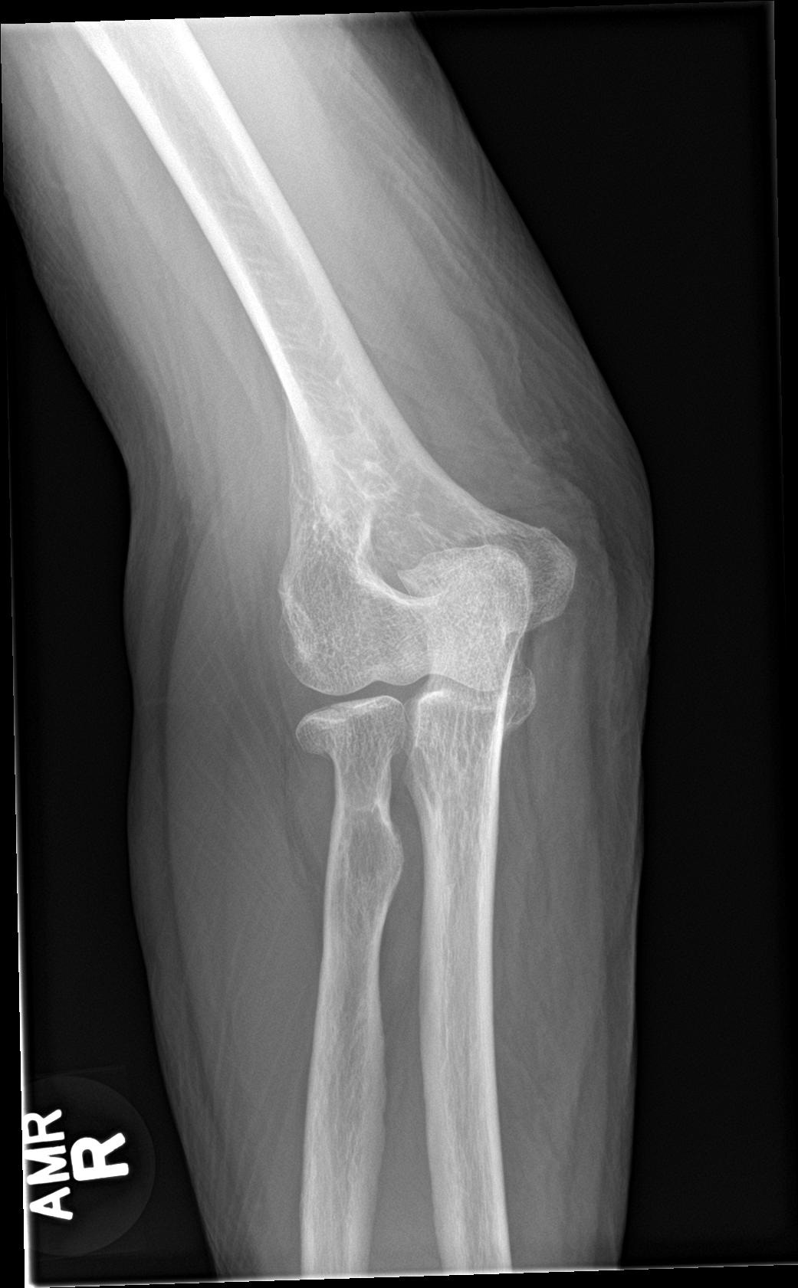

[elbow lat]
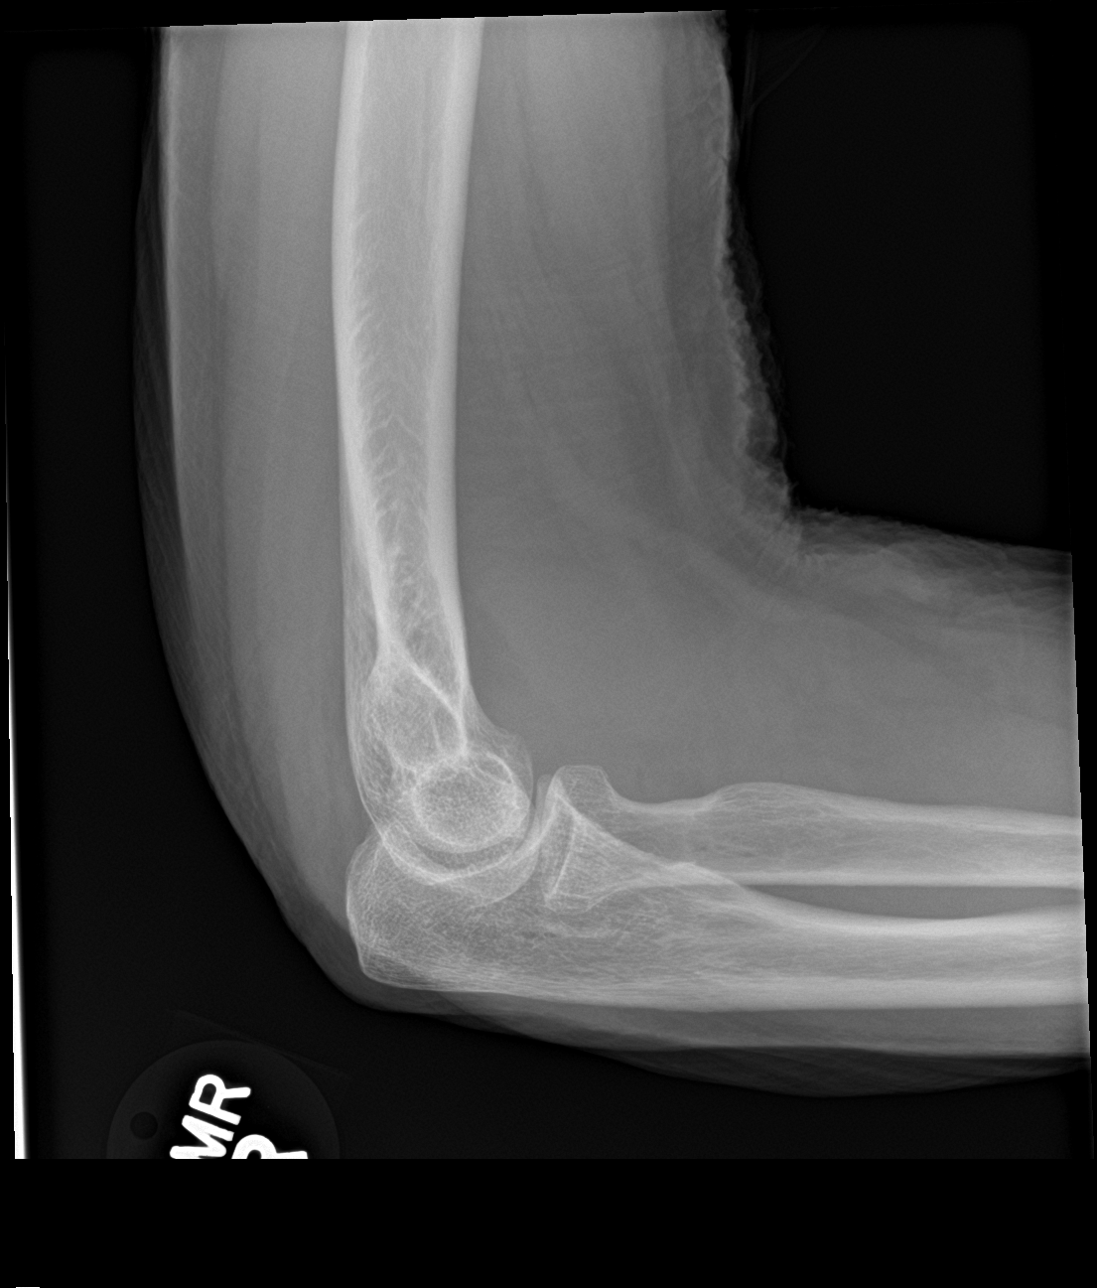

[4 of 4 positions shown; findings below may reference images not displayed]

FINDINGS: Frontal, lateral, and bilateral oblique views were obtained. There
is a fracture of the lateral aspect of the proximal radial
metaphysis with fracture extending to the radiohumeral joint space.
There is mild depression at the level of the radial head. No other
fractures. No dislocation. There is a joint effusion. No joint space
narrowing or erosion.
IMPRESSION: Comminuted fracture proximal radius involving the radial head and
lateral aspect of the proximal metaphysis. Mild impaction in this
area as well as mild depression of the radial head. No other
fractures. Joint effusion present. No dislocation. No appreciable
arthropathy.

These results will be called to the ordering clinician or
representative by the Radiologist Assistant, and communication
documented in the PACS or [REDACTED].

## 2022-01-26 ENCOUNTER — Other Ambulatory Visit: Payer: Self-pay | Admitting: Adult Health

## 2022-02-05 ENCOUNTER — Ambulatory Visit
Admission: RE | Admit: 2022-02-05 | Discharge: 2022-02-05 | Disposition: A | Discharge status: Home / Self Care | Payer: PPO | Source: Ambulatory Visit | Attending: Adult Health | Admitting: Adult Health

## 2022-02-05 DIAGNOSIS — M8588 Other specified disorders of bone density and structure, other site: Secondary | ICD-10-CM | POA: Diagnosis not present

## 2022-02-05 DIAGNOSIS — Z780 Asymptomatic menopausal state: Secondary | ICD-10-CM | POA: Diagnosis not present

## 2022-02-05 DIAGNOSIS — E2839 Other primary ovarian failure: Secondary | ICD-10-CM

## 2022-02-05 DIAGNOSIS — M810 Age-related osteoporosis without current pathological fracture: Secondary | ICD-10-CM | POA: Diagnosis not present

## 2022-02-10 ENCOUNTER — Telehealth: Payer: Self-pay

## 2022-02-10 NOTE — Telephone Encounter (Signed)
-----  Message from Gardenia Phlegm, NP sent at 02/10/2022  1:21 PM EST ----- Please tell Debra Henderson I recommend calcium, vitamin d, weight bearing exercises.  She will return to receive Zometa in August.  ----- Message ----- From: Interface, Rad Results In Sent: 02/05/2022   4:06 PM EST To: Gardenia Phlegm, NP

## 2022-02-10 NOTE — Telephone Encounter (Signed)
Called and given below message. She verbalized understanding and appreciated the call.

## 2022-07-01 DIAGNOSIS — Z1331 Encounter for screening for depression: Secondary | ICD-10-CM | POA: Diagnosis not present

## 2022-07-01 DIAGNOSIS — M810 Age-related osteoporosis without current pathological fracture: Secondary | ICD-10-CM | POA: Diagnosis not present

## 2022-07-01 DIAGNOSIS — Z9229 Personal history of other drug therapy: Secondary | ICD-10-CM | POA: Diagnosis not present

## 2022-07-01 DIAGNOSIS — I700 Atherosclerosis of aorta: Secondary | ICD-10-CM | POA: Diagnosis not present

## 2022-07-01 DIAGNOSIS — I701 Atherosclerosis of renal artery: Secondary | ICD-10-CM | POA: Diagnosis not present

## 2022-07-01 DIAGNOSIS — K589 Irritable bowel syndrome without diarrhea: Secondary | ICD-10-CM | POA: Diagnosis not present

## 2022-07-01 DIAGNOSIS — Z681 Body mass index (BMI) 19 or less, adult: Secondary | ICD-10-CM | POA: Diagnosis not present

## 2022-07-01 DIAGNOSIS — I739 Peripheral vascular disease, unspecified: Secondary | ICD-10-CM | POA: Diagnosis not present

## 2022-07-01 DIAGNOSIS — E782 Mixed hyperlipidemia: Secondary | ICD-10-CM | POA: Diagnosis not present

## 2022-07-01 DIAGNOSIS — Z0001 Encounter for general adult medical examination with abnormal findings: Secondary | ICD-10-CM | POA: Diagnosis not present

## 2022-07-01 DIAGNOSIS — I10 Essential (primary) hypertension: Secondary | ICD-10-CM | POA: Diagnosis not present

## 2022-07-23 ENCOUNTER — Encounter: Payer: Self-pay | Admitting: Cardiology

## 2022-07-30 DIAGNOSIS — Z0001 Encounter for general adult medical examination with abnormal findings: Secondary | ICD-10-CM | POA: Diagnosis not present

## 2022-07-30 DIAGNOSIS — M810 Age-related osteoporosis without current pathological fracture: Secondary | ICD-10-CM | POA: Diagnosis not present

## 2022-07-30 DIAGNOSIS — I10 Essential (primary) hypertension: Secondary | ICD-10-CM | POA: Diagnosis not present

## 2022-08-19 ENCOUNTER — Other Ambulatory Visit: Payer: Self-pay | Admitting: Adult Health

## 2022-08-19 DIAGNOSIS — Z1231 Encounter for screening mammogram for malignant neoplasm of breast: Secondary | ICD-10-CM

## 2022-08-29 ENCOUNTER — Ambulatory Visit: Payer: PPO | Admitting: Cardiology

## 2022-09-04 ENCOUNTER — Ambulatory Visit: Payer: PPO | Admitting: Cardiology

## 2022-09-04 NOTE — Progress Notes (Deleted)
Clinical Summary Debra Henderson is a 77 y.o.female seen today as a new consult for the following medical problems   1. Syncope - 2-3 episodes   - first episode roughly 6 months ago - was standing at kitchen sink. Started trembling, next thing she knew was on the floor. Poor oral intake.  - next episode few months later. She was stepping on a stool, when she got to the 2nd step and had a fall. - occasional dizziness at times, last episode about 6 week ago - no palpitaitons.    - water large glass x 2, 2-3 cokes per day, sweet tea.        2. PAD status post left renal artery stenting back in 2004 and restenting because of in-stent restenosis. She's also had left iliac stenting    3. HTN     4. Hyperlipidemia   5. Carotid bruit  6. Breast DCIS - followed by onc   Husband passed recently.  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, ER/PR+   Cancer ([facility]) 2014   breast cancer   Chronic kidney disease    renal artery stenosis   Claudication ([facility])    lower extremities   Complication of anesthesia    ' It does not work " I am difficult to put tpo sleep   Family history of anesthesia complication    my brother is difficult to put to sleep also   GERD (gastroesophageal reflux disease)    otc   History of renal stent    LEA DUPLEX, 03/16/2012 - LEFT EIA DISTAL/COMMON FEMORAL ARTERY-demonstrated occlusive disease   Hyperlipidemia    Hypertension    Incontinence    Peripheral vascular disease ([facility])    prior stenting of left iliac artery   Personal history of radiation therapy 2014   right breast   Personal history of radiation therapy 2019   left breast   Radiation 08/02/12-08/23/12   Right breast 42.72 Gy x 16 fx   Renal artery stenosis ([facility])    RENAL DOPPLER, 02/12/2011 - RIGHT RENAL ARTERY 60-99% diameter reduction, LEFT RENAL ARTERY AT STENT 60-99% diameter reduction     Allergies  Allergen  Reactions   Tetracyclines & Related Nausea And Vomiting and Other (See Comments)    Vomiting blood   Vytorin [Ezetimibe-Simvastatin] Other (See Comments)    Per Dr Erlene Quan note   Chicken Allergy Diarrhea and Rash   Egg-Derived Products Diarrhea and Rash   Erythromycin Rash   Lipitor [Atorvastatin] Rash and Other (See Comments)    Myalgias    Penicillins Swelling and Rash   Sulfa Antibiotics Swelling and Rash     Current Outpatient Medications  Medication Sig Dispense Refill   anastrozole (ARIMIDEX) 1 MG tablet TAKE 1 TABLET BY MOUTH EVERY DAY 90 tablet 4   clopidogrel (PLAVIX) 75 MG tablet TAKE1 TABLET EVERY DAY  3   olmesartan-hydrochlorothiazide (BENICAR HCT) 40-12.5 MG tablet Take 1 tablet by mouth daily.     omega-3 acid ethyl esters (LOVAZA) 1 G capsule Take 1 g by mouth 2 (two) times daily.     pravastatin (PRAVACHOL) 10 MG tablet Take 10 mg by mouth daily.     No current facility-administered medications for this visit.     Past Surgical History:  Procedure Laterality Date   ANGIOPLASTY ILLIAC ARTERY  02/24/2012   Dr Allyson Sabal  L iliac restent  ATHERECTOMY N/A 02/24/2012   Procedure: ATHERECTOMY;  Surgeon: Runell Gess, MD;  Location: [facility] CATH LAB;  Service: Cardiovascular;  Laterality: N/A;   BLADDER SUSPENSION  1980's   bladder tack     BREAST BIOPSY Right 05/2013   BREAST BIOPSY Right 02/13/2012   BREAST BIOPSY Left 12/25/2016   BREAST LUMPECTOMY Right 05/02/2012   BREAST LUMPECTOMY Left 02/06/2017   BREAST LUMPECTOMY WITH NEEDLE LOCALIZATION Right 04/22/2012   Procedure: RIGHT BREAST WIRE LOCALIZATION  LUMPECTOMY ;  Surgeon: Robyne Askew, MD;  Location: [facility] OR;  Service: General;  Laterality: Right;   BREAST LUMPECTOMY WITH RADIOACTIVE SEED LOCALIZATION Left 02/06/2017   Procedure: BREAST LUMPECTOMY WITH RADIOACTIVE SEED LOCALIZATION;  Surgeon: Griselda Miner, MD;  Location: [facility];  Service: General;  Laterality: Left;   fibroid breast     left  breast-adenoma benign 1980's   ILIAC ARTERY STENT  11/21/19/02   left    NM MYOVIEW LTD  03/21/2010   normal myocardial perfusion study   PERIPHERAL VASCULAR CATHETERIZATION Left 02/24/2012   Common iliac artery, 8x38 iCast Stent, resulting in a reduction from 80% in-stent restenosis to 0% residual   PERIPHERAL VASCULAR CATHETERIZATION Left 08/04/2006   Renal 90% stenosis, 3.5x 13 drug-eluting stent resulting in a reduction of 90% in-stent restenosis to 0% residual   PERIPHERAL VASCULAR CATHETERIZATION Left 05/22/2005   Renal 80% in-stent stenosis, 5x15 Aviator resulting in a reduction of 80% in-stent restenosis to 0% residual   PERIPHERAL VASCULAR CATHETERIZATION Left 02/22/2002   Renal in-stent restenosis, 5x15 Guidant rapid-exchange balloon resulting in reduction of a 95% in-stent restenosis to less than 20% residual   PERIPHERAL VASCULAR CATHETERIZATION Left 12/10/2000   95% renal stenosis, 6x9mm Genesis Aviator balloon/stent deployed at 10 atmospheres resulting in a reduction  of 95% stenosis to 0% residual; Common iliac artery stenosis, P12x4 mounted on a 7x2 Powerflex balloon resulting in a reduction of 80% to 0% residual   PERIPHERAL VASCULAR CATHETERIZATION Left 07/27/2000   95% renal stenosis, 7mm x 6cm Smart stent resulting in a reduction of 90-95%  to 0% residual   stent /kidney  12/10/00,08/04/06   2002 R renal artery, 2008 L renal   TUBAL LIGATION     1980's     Allergies  Allergen Reactions   Tetracyclines & Related Nausea And Vomiting and Other (See Comments)    Vomiting blood   Vytorin [Ezetimibe-Simvastatin] Other (See Comments)    Per Dr Erlene Quan note   Chicken Allergy Diarrhea and Rash   Egg-Derived Products Diarrhea and Rash   Erythromycin Rash   Lipitor [Atorvastatin] Rash and Other (See Comments)    Myalgias    Penicillins Swelling and Rash   Sulfa Antibiotics Swelling and Rash      Family History  Problem Relation Age of Onset   Cancer Mother        colon    COPD Mother    Cancer Father        brain   Stroke Sister    Heart attack Sister      Social History Debra Henderson reports that she quit smoking about 34 years ago. Her smoking use included cigarettes. She has never used smokeless tobacco. Debra Henderson reports no history of alcohol use.   Review of Systems CONSTITUTIONAL: No weight loss, fever, chills, weakness or fatigue.  HEENT: Eyes: No visual loss, blurred vision, double vision or yellow sclerae.No hearing loss, sneezing, congestion, runny nose or sore throat.  SKIN: No  rash or itching.  CARDIOVASCULAR:  RESPIRATORY: No shortness of breath, cough or sputum.  GASTROINTESTINAL: No anorexia, nausea, vomiting or diarrhea. No abdominal pain or blood.  GENITOURINARY: No burning on urination, no polyuria NEUROLOGICAL: No headache, dizziness, syncope, paralysis, ataxia, numbness or tingling in the extremities. No change in bowel or bladder control.  MUSCULOSKELETAL: No muscle, back pain, joint pain or stiffness.  LYMPHATICS: No enlarged nodes. No history of splenectomy.  PSYCHIATRIC: No history of depression or anxiety.  ENDOCRINOLOGIC: No reports of sweating, cold or heat intolerance. No polyuria or polydipsia.  Marland Kitchen   Physical Examination There were no vitals filed for this visit. There were no vitals filed for this visit.  Gen: resting comfortably, no acute distress HEENT: no scleral icterus, pupils equal round and reactive, no palptable cervical adenopathy,  CV Resp: Clear to auscultation bilaterally GI: abdomen is soft, non-tender, non-distended, normal bowel sounds, no hepatosplenomegaly MSK: extremities are warm, no edema.  Skin: warm, no rash Neuro:  no focal deficits Psych: appropriate affect   Diagnostic Studies     Assessment and Plan   1. Syncope - 2 episodes, last one occurring a few months ago - unclear etiology based on history. Her orthostatics are essentailyl abnormal today with SBP dropping 18 points with  standing. Orthostatic syncope would fit with description of episodes. Episodes overall not highly suggestive of cardiogenic syncope. Its unclear if the episode on the stool truly was syncope or just losing her footing.  - encouraged increased aggressive hydration - if recurrent episodes different in nature could consider home monitor. I don't see an indication for echo at this time   EKG shows NSR   2. Carotid stenosis - I don't hear much of a bruit on exam but had some mild plaque back in 2014 and needs a repeat study, will order carotid US     Antoine Poche, M.D., F.A.C.C.

## 2022-09-10 ENCOUNTER — Other Ambulatory Visit: Payer: Self-pay | Admitting: *Deleted

## 2022-09-10 DIAGNOSIS — D649 Anemia, unspecified: Secondary | ICD-10-CM

## 2022-09-10 DIAGNOSIS — E538 Deficiency of other specified B group vitamins: Secondary | ICD-10-CM

## 2022-09-10 DIAGNOSIS — E2839 Other primary ovarian failure: Secondary | ICD-10-CM

## 2022-09-10 DIAGNOSIS — D0512 Intraductal carcinoma in situ of left breast: Secondary | ICD-10-CM

## 2022-09-11 ENCOUNTER — Inpatient Hospital Stay: Payer: PPO

## 2022-09-11 ENCOUNTER — Encounter: Payer: Self-pay | Admitting: Adult Health

## 2022-09-11 ENCOUNTER — Inpatient Hospital Stay: Payer: PPO | Attending: Adult Health | Admitting: Adult Health

## 2022-09-11 VITALS — BP 134/54 | HR 82 | Temp 97.7°F | Resp 17 | Wt 104.8 lb

## 2022-09-11 DIAGNOSIS — D0512 Intraductal carcinoma in situ of left breast: Secondary | ICD-10-CM

## 2022-09-11 DIAGNOSIS — Z923 Personal history of irradiation: Secondary | ICD-10-CM | POA: Insufficient documentation

## 2022-09-11 DIAGNOSIS — Z86000 Personal history of in-situ neoplasm of breast: Secondary | ICD-10-CM | POA: Diagnosis not present

## 2022-09-11 DIAGNOSIS — M818 Other osteoporosis without current pathological fracture: Secondary | ICD-10-CM | POA: Diagnosis not present

## 2022-09-11 DIAGNOSIS — Z87891 Personal history of nicotine dependence: Secondary | ICD-10-CM | POA: Diagnosis not present

## 2022-09-11 DIAGNOSIS — Z79899 Other long term (current) drug therapy: Secondary | ICD-10-CM | POA: Diagnosis not present

## 2022-09-11 DIAGNOSIS — Z800 Family history of malignant neoplasm of digestive organs: Secondary | ICD-10-CM | POA: Diagnosis not present

## 2022-09-11 DIAGNOSIS — E2839 Other primary ovarian failure: Secondary | ICD-10-CM

## 2022-09-11 DIAGNOSIS — Z808 Family history of malignant neoplasm of other organs or systems: Secondary | ICD-10-CM | POA: Insufficient documentation

## 2022-09-11 DIAGNOSIS — M810 Age-related osteoporosis without current pathological fracture: Secondary | ICD-10-CM | POA: Diagnosis not present

## 2022-09-11 DIAGNOSIS — D649 Anemia, unspecified: Secondary | ICD-10-CM

## 2022-09-11 DIAGNOSIS — E538 Deficiency of other specified B group vitamins: Secondary | ICD-10-CM

## 2022-09-11 LAB — FERRITIN: Ferritin: 15 ng/mL (ref 11–307)

## 2022-09-11 LAB — CBC WITH DIFFERENTIAL (CANCER CENTER ONLY)
Abs Immature Granulocytes: 0.02 10*3/uL (ref 0.00–0.07)
Basophils Absolute: 0.1 10*3/uL (ref 0.0–0.1)
Basophils Relative: 1 %
Eosinophils Absolute: 0 10*3/uL (ref 0.0–0.5)
Eosinophils Relative: 1 %
HCT: 33.9 % — ABNORMAL LOW (ref 36.0–46.0)
Hemoglobin: 11.5 g/dL — ABNORMAL LOW (ref 12.0–15.0)
Immature Granulocytes: 0 %
Lymphocytes Relative: 22 %
Lymphs Abs: 1.4 10*3/uL (ref 0.7–4.0)
MCH: 31.8 pg (ref 26.0–34.0)
MCHC: 33.9 g/dL (ref 30.0–36.0)
MCV: 93.6 fL (ref 80.0–100.0)
Monocytes Absolute: 0.6 10*3/uL (ref 0.1–1.0)
Monocytes Relative: 9 %
Neutro Abs: 4.4 10*3/uL (ref 1.7–7.7)
Neutrophils Relative %: 67 %
Platelet Count: 346 10*3/uL (ref 150–400)
RBC: 3.62 MIL/uL — ABNORMAL LOW (ref 3.87–5.11)
RDW: 17.9 % — ABNORMAL HIGH (ref 11.5–15.5)
WBC Count: 6.5 10*3/uL (ref 4.0–10.5)
nRBC: 0 % (ref 0.0–0.2)

## 2022-09-11 LAB — CMP (CANCER CENTER ONLY)
ALT: 7 U/L (ref 0–44)
AST: 10 U/L — ABNORMAL LOW (ref 15–41)
Albumin: 4.1 g/dL (ref 3.5–5.0)
Alkaline Phosphatase: 64 U/L (ref 38–126)
Anion gap: 5 (ref 5–15)
BUN: 10 mg/dL (ref 8–23)
CO2: 30 mmol/L (ref 22–32)
Calcium: 9.5 mg/dL (ref 8.9–10.3)
Chloride: 97 mmol/L — ABNORMAL LOW (ref 98–111)
Creatinine: 0.74 mg/dL (ref 0.44–1.00)
GFR, Estimated: >60 mL/min (ref >60)
Glucose, Bld: 93 mg/dL (ref 70–99)
Potassium: 4.7 mmol/L (ref 3.5–5.1)
Sodium: 132 mmol/L — ABNORMAL LOW (ref 135–145)
Total Bilirubin: 0.3 mg/dL (ref 0.3–1.2)
Total Protein: 7.4 g/dL (ref 6.5–8.1)

## 2022-09-11 LAB — RETIC PANEL
Immature Retic Fract: 11.8 % (ref 2.3–15.9)
RBC.: 3.63 MIL/uL — ABNORMAL LOW (ref 3.87–5.11)
Retic Count, Absolute: 65 10*3/uL (ref 19.0–186.0)
Retic Ct Pct: 1.8 % (ref 0.4–3.1)
Reticulocyte Hemoglobin: 36.4 pg (ref >27.9)

## 2022-09-11 LAB — IRON AND IRON BINDING CAPACITY (CC-WL,HP ONLY)
Iron: 151 ug/dL (ref 28–170)
Saturation Ratios: 36 % — ABNORMAL HIGH (ref 10.4–31.8)
TIBC: 423 ug/dL (ref 250–450)
UIBC: 272 ug/dL (ref 148–442)

## 2022-09-11 LAB — VITAMIN B12: Vitamin B-12: 165 pg/mL — ABNORMAL LOW (ref 180–914)

## 2022-09-11 LAB — FOLATE: Folate: 8 ng/mL (ref >5.9)

## 2022-09-11 NOTE — Progress Notes (Signed)
[facility] Cancer Follow up:    Debra Nevins, MD [address] [place] [address]   DIAGNOSIS:  Cancer Staging  Ductal carcinoma in situ (DCIS) of left breast Staging form: Breast, AJCC 8th Edition - Pathologic: Stage 0 (pTis (DCIS), pN0, cM0) - Unsigned   SUMMARY OF ONCOLOGIC HISTORY: Debra Henderson woman:   1)  status post  right lumpectomy April 2014 for a low-grade ductal carcinoma in situ. Focally close to the posterior margin at 0.1 cm. ER +100%, PR +100%.   2) Status post radiation therapy under the care of Dr. Michell Heinrich, completed 08/23/2012   3) decided against antiestrogens June 2015   4)  multiple comorbidities including peripheral vascular disease with stenting of the left iliac artery; hypertension; claudication; and a history of renal artery stenosis, with stenting of the left renal artery.   5) right breast upper inner quadrant biopsy 06/17/2013 showed a complex sclerosing lesion   6) status post left breast upper outer quadrant biopsy 12/25/2016 showing ductal carcinoma in situ, low-grade, estrogen and progesterone receptor positive.   7) left lumpectomy 02/06/2017 ductal carcinoma in situ, grade 1, measuring 1.0 cm, with negative margins   8) adjuvant radiation 04/02/17-04/30/17  Site/dose: 1) Left breast/ 40.05 Gy in 15 fractions 2) Left breast boost/ 12 Gy in 6 fractions   9) anastrozole started 06/01/2017             (a) bone density 07/15/2017 showed a T score of -2.7             (b) zoledronate started 12/24/2017, repeated yearly             (c) repeat bone density 12/02/2019 stable with T- 2.7    CURRENT THERAPY: Anastrozole; Zoledronate  INTERVAL HISTORY: Debra Henderson 77 y.o. female returns for f/u of her history of breast cancer.  She has completed 5 years of Anastrozole for her DCIS.  She is due for Zoledronate today, however recently lost a tooth.    Her most recent bone density testing occurred on February 05, 2022  demonstrating osteoporosis in the left femur of -2.9.  Her next mammogram is scheduled for 09/30/2022.  She denies any significant issues today.    Larey Seat in March and is unsure if these falls are related to losing consciousness or not.  She denies any headaches vision changes or any other current concerns.   Patient Active Problem List   Diagnosis Date Noted   B12 deficiency 09/21/2021   Osteoporosis 09/14/2017   Essential hypertension 02/04/2017   Hyperlipidemia 02/04/2017   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  Left lower extremity. 02/25/2012    is allergic to other, tetracyclines & related, vytorin [ezetimibe-simvastatin], chicken allergy, egg-derived products, erythromycin, lipitor [atorvastatin], penicillins, and sulfa antibiotics.  MEDICAL HISTORY: 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, ER/PR+   Cancer (HCC) 2014   breast cancer   Chronic kidney disease    renal artery stenosis   Claudication (HCC)    lower extremities   Complication of anesthesia    ' It does not work " I am difficult to put tpo sleep   Family history of anesthesia complication    my brother is difficult  to put to sleep also   GERD (gastroesophageal reflux disease)    otc   History of renal stent    LEA DUPLEX, 03/16/2012 - LEFT EIA DISTAL/COMMON FEMORAL ARTERY-demonstrated occlusive disease   Hyperlipidemia    Hypertension    Incontinence    Peripheral vascular disease (HCC)    prior stenting of left iliac artery   Personal history of radiation therapy 2014   right breast   Personal history of radiation therapy 2019   left breast   Radiation 08/02/12-08/23/12   Right breast 42.72 Gy x 16 fx   Renal artery stenosis  (HCC)    RENAL DOPPLER, 02/12/2011 - RIGHT RENAL ARTERY 60-99% diameter reduction, LEFT RENAL ARTERY AT STENT 60-99% diameter reduction    SURGICAL HISTORY: Past Surgical History:  Procedure Laterality Date   ANGIOPLASTY ILLIAC ARTERY  02/24/2012   Dr Allyson Sabal  L iliac restent   ATHERECTOMY N/A 02/24/2012   Procedure: ATHERECTOMY;  Surgeon: Runell Gess, MD;  Location: [facility] CATH LAB;  Service: Cardiovascular;  Laterality: N/A;   BLADDER SUSPENSION  1980's   bladder tack     BREAST BIOPSY Right 05/2013   BREAST BIOPSY Right 02/13/2012   BREAST BIOPSY Left 12/25/2016   BREAST LUMPECTOMY Right 05/02/2012   BREAST LUMPECTOMY Left 02/06/2017   BREAST LUMPECTOMY WITH NEEDLE LOCALIZATION Right 04/22/2012   Procedure: RIGHT BREAST WIRE LOCALIZATION  LUMPECTOMY ;  Surgeon: Robyne Askew, MD;  Location: [facility] OR;  Service: General;  Laterality: Right;   BREAST LUMPECTOMY WITH RADIOACTIVE SEED LOCALIZATION Left 02/06/2017   Procedure: BREAST LUMPECTOMY WITH RADIOACTIVE SEED LOCALIZATION;  Surgeon: Griselda Miner, MD;  Location: [facility];  Service: General;  Laterality: Left;   fibroid breast     left breast-adenoma benign 1980's   ILIAC ARTERY STENT  11/21/19/02   left    NM MYOVIEW LTD  03/21/2010   normal myocardial perfusion study   PERIPHERAL VASCULAR CATHETERIZATION Left 02/24/2012   Common iliac artery, 8x38 iCast Stent, resulting in a reduction from 80% in-stent restenosis to 0% residual   PERIPHERAL VASCULAR CATHETERIZATION Left 08/04/2006   Renal 90% stenosis, 3.5x 13 drug-eluting stent resulting in a reduction of 90% in-stent restenosis to 0% residual   PERIPHERAL VASCULAR CATHETERIZATION Left 05/22/2005   Renal 80% in-stent stenosis, 5x15 Aviator resulting in a reduction of 80% in-stent restenosis to 0% residual   PERIPHERAL VASCULAR CATHETERIZATION Left 02/22/2002   Renal in-stent restenosis, 5x15 Guidant rapid-exchange balloon resulting in reduction of a 95% in-stent restenosis to  less than 20% residual   PERIPHERAL VASCULAR CATHETERIZATION Left 12/10/2000   95% renal stenosis, 6x92mm Genesis Aviator balloon/stent deployed at 10 atmospheres resulting in a reduction  of 95% stenosis to 0% residual; Common iliac artery stenosis, P12x4 mounted on a 7x2 Powerflex balloon resulting in a reduction of 80% to 0% residual   PERIPHERAL VASCULAR CATHETERIZATION Left 07/27/2000   95% renal stenosis, 7mm x 6cm Smart stent resulting in a reduction of 90-95%  to 0% residual   stent /kidney  12/10/00,08/04/06   2002 R renal artery, 2008 L renal   TUBAL LIGATION     1980's    SOCIAL HISTORY: Social History   Socioeconomic History   Marital status: Married    Spouse name: Not on file   Number of children: Not on file   Years of education: Not on file   Highest education level: Not on file  Occupational History   Not on file  Tobacco  Use   Smoking status: Former    Current packs/day: 0.00    Types: Cigarettes    Quit date: 02/24/1988    Years since quitting: 34.5   Smokeless tobacco: Never   Tobacco comments:    quit 1990  Vaping Use   Vaping status: Never Used  Substance and Sexual Activity   Alcohol use: No   Drug use: No   Sexual activity: Never    Birth control/protection: Post-menopausal    Comment: menarche age 42, P76, menopause 54, no HRT  Other Topics Concern   Not on file  Social History Narrative   Not on file   Social Determinants of Health   Financial Resource Strain: Not on file  Food Insecurity: Not on file  Transportation Needs: Not on file  Physical Activity: Not on file  Stress: Not on file  Social Connections: Unknown (06/04/2021)   Received from [facility], [facility]   Social Network    Social Network: Not on file  Intimate Partner Violence: Unknown (04/26/2021)   Received from [facility], [facility]   HITS    Physically Hurt: Not on file    Insult or Talk Down To: Not on file    Threaten Physical Harm: Not on file    Scream  or Curse: Not on file    FAMILY HISTORY: Family History  Problem Relation Age of Onset   Cancer Mother        colon   COPD Mother    Cancer Father        brain   Stroke Sister    Heart attack Sister     Review of Systems  Constitutional:  Negative for appetite change, chills, fatigue, fever and unexpected weight change.  HENT:   Negative for hearing loss, lump/mass and trouble swallowing.   Eyes:  Negative for eye problems and icterus.  Respiratory:  Negative for chest tightness, cough and shortness of breath.   Cardiovascular:  Negative for chest pain, leg swelling and palpitations.  Gastrointestinal:  Negative for abdominal distention, abdominal pain, constipation, diarrhea, nausea and vomiting.  Endocrine: Negative for hot flashes.  Genitourinary:  Negative for difficulty urinating.   Musculoskeletal:  Negative for arthralgias.  Skin:  Negative for itching and rash.  Neurological:  Negative for dizziness, extremity weakness, headaches and numbness.  Hematological:  Negative for adenopathy. Does not bruise/bleed easily.  Psychiatric/Behavioral:  Negative for depression. The patient is not nervous/anxious.       PHYSICAL EXAMINATION   Onc Performance Status - 09/11/22 1400       ECOG Perf Status   ECOG Perf Status Fully active, able to carry on all pre-disease performance without restriction      KPS SCALE   KPS % SCORE Able to carry on normal activity, minor s/s of disease             Vitals:   09/11/22 1357  BP: (!) 134/54  Pulse: 82  Resp: 17  Temp: 97.7 F (36.5 C)  SpO2: 98%    Physical Exam Constitutional:      General: She is not in acute distress.    Appearance: Normal appearance. She is not toxic-appearing.  HENT:     Head: Normocephalic and atraumatic.     Mouth/Throat:     Mouth: Mucous membranes are moist.     Pharynx: Oropharynx is clear. No oropharyngeal exudate or posterior oropharyngeal erythema.  Eyes:     General: No scleral  icterus. Cardiovascular:  Rate and Rhythm: Normal rate and regular rhythm.     Pulses: Normal pulses.     Heart sounds: Normal heart sounds.  Pulmonary:     Effort: Pulmonary effort is normal.     Breath sounds: Normal breath sounds.  Chest:     Comments: Left breast status postlumpectomy and radiation no sign of local recurrence right breast status postlumpectomy and radiation no sign of local recurrence. Abdominal:     General: Abdomen is flat. Bowel sounds are normal. There is no distension.     Palpations: Abdomen is soft.     Tenderness: There is no abdominal tenderness.  Musculoskeletal:        General: No swelling.     Cervical back: Neck supple.  Lymphadenopathy:     Cervical: No cervical adenopathy.  Skin:    General: Skin is warm and dry.     Findings: No rash.  Neurological:     General: No focal deficit present.     Mental Status: She is alert.  Psychiatric:        Mood and Affect: Mood normal.        Behavior: Behavior normal.     LABORATORY DATA:  CBC    Component Value Date/Time   WBC 6.5 09/11/2022 1335   WBC 7.7 12/26/2019 1338   RBC 3.62 (L) 09/11/2022 1335   RBC 3.63 (L) 09/11/2022 1334   HGB 11.5 (L) 09/11/2022 1335   HGB 13.4 10/03/2013 1504   HCT 33.9 (L) 09/11/2022 1335   HCT 40.2 10/03/2013 1504   PLT 346 09/11/2022 1335   PLT 277 10/03/2013 1504   MCV 93.6 09/11/2022 1335   MCV 98.0 10/03/2013 1504   MCH 31.8 09/11/2022 1335   MCHC 33.9 09/11/2022 1335   RDW 17.9 (H) 09/11/2022 1335   RDW 14.0 10/03/2013 1504   LYMPHSABS 1.4 09/11/2022 1335   LYMPHSABS 2.3 10/03/2013 1504   MONOABS 0.6 09/11/2022 1335   MONOABS 0.7 10/03/2013 1504   EOSABS 0.0 09/11/2022 1335   EOSABS 0.1 10/03/2013 1504   BASOSABS 0.1 09/11/2022 1335   BASOSABS 0.0 10/03/2013 1504    CMP     Component Value Date/Time   NA 126 (L) 09/10/2021 1440   NA 138 10/03/2013 1505   K 4.0 09/10/2021 1440   K 3.2 (L) 10/03/2013 1505   CL 92 (L) 09/10/2021 1440    CL 100 05/20/2012 1454   CO2 29 09/10/2021 1440   CO2 27 10/03/2013 1505   GLUCOSE 93 09/10/2021 1440   GLUCOSE 93 10/03/2013 1505   GLUCOSE 94 05/20/2012 1454   BUN 12 09/10/2021 1440   BUN 7.2 10/03/2013 1505   CREATININE 0.72 09/10/2021 1440   CREATININE 0.8 10/03/2013 1505   CALCIUM 9.6 09/10/2021 1440   CALCIUM 9.5 10/03/2013 1505   PROT 7.3 09/10/2021 1440   PROT 7.5 10/03/2013 1505   ALBUMIN 4.2 09/10/2021 1440   ALBUMIN 3.5 10/03/2013 1505   AST 11 (L) 09/10/2021 1440   AST 11 10/03/2013 1505   ALT 8 09/10/2021 1440   ALT 9 10/03/2013 1505   ALKPHOS 60 09/10/2021 1440   ALKPHOS 76 10/03/2013 1505   BILITOT 0.3 09/10/2021 1440   BILITOT 0.31 10/03/2013 1505   GFRNONAA >60 09/10/2021 1440   GFRAA >60 12/23/2018 1353       ASSESSMENT and THERAPY PLAN:   Ductal carcinoma in situ (DCIS) of left breast Doing well--no signs of recurrence.  She is scheduled for repeat mammogram September 30, 2022.  She will continue on annual observation.  I discontinued her anastrozole as she has now completed 5 years of therapy.  Osteoporosis Her most recent bone density was consistent with osteoporosis.  The worsening in her bone density was also likely related to her anastrozole therapy which has since been discontinued.  Since she lost a tooth recently she will not receive Zometa today however we will see her back next year for labs follow-up in potentially Zom[place].  She will be due for repeat bone density testing in January 2026.    All questions were answered. The patient knows to call the clinic with any problems, questions or concerns. We can certainly see the patient much sooner if necessary.  Total encounter time:30 minutes*in face-to-face visit time, chart review, lab review, care coordination, order entry, and documentation of the encounter time.  Lillard Anes, NP 09/11/22 2:42 PM Medical Oncology and Hematology [facility] [address] [place],  [place] [address] Tel. [phone]    Fax. [phone]  *Total Encounter Time as defined by the Centers for Medicare and Medicaid Services includes, in addition to the face-to-face time of a patient visit (documented in the note above) non-face-to-face time: obtaining and reviewing outside history, ordering and reviewing medications, tests or procedures, care coordination (communications with other health care professionals or caregivers) and documentation in the medical record.

## 2022-09-11 NOTE — Assessment & Plan Note (Signed)
Her most recent bone density was consistent with osteoporosis.  The worsening in her bone density was also likely related to her anastrozole therapy which has since been discontinued.  Since she lost a tooth recently she will not receive Zometa today however we will see her back next year for labs follow-up in potentially [place].  She will be due for repeat bone density testing in January 2026.

## 2022-09-11 NOTE — Assessment & Plan Note (Signed)
Doing well--no signs of recurrence.  She is scheduled for repeat mammogram September 30, 2022.  She will continue on annual observation.  I discontinued her anastrozole as she has now completed 5 years of therapy.

## 2022-09-15 ENCOUNTER — Telehealth: Payer: Self-pay

## 2022-09-15 ENCOUNTER — Other Ambulatory Visit: Payer: Self-pay

## 2022-09-15 DIAGNOSIS — D649 Anemia, unspecified: Secondary | ICD-10-CM

## 2022-09-15 DIAGNOSIS — E538 Deficiency of other specified B group vitamins: Secondary | ICD-10-CM

## 2022-09-15 NOTE — Telephone Encounter (Signed)
Called and left a message to start b12 1000 mcg daily and the office is sending a scheduling message to recheck labs in 12 weeks. Ask her to call the office back for questions.

## 2022-09-15 NOTE — Telephone Encounter (Signed)
-----   Message from Noreene Filbert sent at 09/15/2022  1:44 PM EDT ----- I recommend that she take 1000 mcg sublingual daily and repeat labs in 12 weeks--CBC with differential, vitamin B12, iron and TIBC, ferritin ----- Message ----- From: Morrell Riddle, RN Sent: 09/15/2022  12:02 PM EDT To: Loa Socks, NP; [facility] Bc 4  She is not taking any b12. ----- Message ----- From: Loa Socks, NP Sent: 09/12/2022  11:09 AM EDT To: [facility] Bc 4  Patient's B12 level is low.  Is she taking any? ----- Message ----- From: Leory Plowman, Lab In [place] Sent: 09/11/2022   1:53 PM EDT To: Loa Socks, NP

## 2022-09-15 NOTE — Telephone Encounter (Signed)
Called to verify if she is taking any B12. She is not taking any at home. Told her I would tell Mardella Layman and the office would call her back with what is recommended.

## 2022-09-15 NOTE — Telephone Encounter (Signed)
-----   Message from Noreene Filbert sent at 09/12/2022 11:09 AM EDT ----- Patient's B12 level is low.  Is she taking any? ----- Message ----- From: Leory Plowman, Lab In [place] Sent: 09/11/2022   1:53 PM EDT To: Loa Socks, NP

## 2022-09-24 ENCOUNTER — Telehealth: Payer: Self-pay | Admitting: Hematology and Oncology

## 2022-09-24 NOTE — Telephone Encounter (Signed)
Scheduled appointment per scheduling message. Patient is aware of the made appointment. 

## 2022-09-30 ENCOUNTER — Ambulatory Visit
Admission: RE | Admit: 2022-09-30 | Discharge: 2022-09-30 | Disposition: A | Discharge status: Home / Self Care | Payer: PPO | Source: Ambulatory Visit | Attending: Adult Health | Admitting: Adult Health

## 2022-09-30 DIAGNOSIS — Z1231 Encounter for screening mammogram for malignant neoplasm of breast: Secondary | ICD-10-CM

## 2022-10-07 ENCOUNTER — Ambulatory Visit: Payer: PPO | Attending: Cardiology | Admitting: Nurse Practitioner

## 2022-10-07 ENCOUNTER — Encounter: Payer: Self-pay | Admitting: Nurse Practitioner

## 2022-10-07 VITALS — BP 118/58 | HR 78 | Ht 60.0 in | Wt 103.8 lb

## 2022-10-07 DIAGNOSIS — I739 Peripheral vascular disease, unspecified: Secondary | ICD-10-CM

## 2022-10-07 DIAGNOSIS — I10 Essential (primary) hypertension: Secondary | ICD-10-CM | POA: Diagnosis not present

## 2022-10-07 DIAGNOSIS — E785 Hyperlipidemia, unspecified: Secondary | ICD-10-CM

## 2022-10-07 DIAGNOSIS — R55 Syncope and collapse: Secondary | ICD-10-CM

## 2022-10-07 MED ORDER — OLMESARTAN MEDOXOMIL-HCTZ 20-12.5 MG PO TABS: 1.0000 | ORAL_TABLET | Freq: Every day | ORAL | 1 refills | Status: DC

## 2022-10-07 NOTE — Progress Notes (Unsigned)
Cardiology Office Note:  .   Date: 10/07/2022 ID:  Debra Henderson, DOB [birth year], MRN [id number] PCP: [name], MD  [facility] Providers Cardiologist:  Dina Rich, MD    History of Present Illness: .   Debra Henderson is a 77 y.o. female with a PMH of syncope, HTN, HLD, PAD (history of left renal artery stenting, left common and external iliac artery stenting for claudication), carotid bruit, osteoporosis, B12 deficiency, and ductal carcinoma in situ of left breast (follows Oncology), who presents today for follow-up. Felt that past episodes of syncope were orthostatic related based on her description, SBP dropped 18 points on standing during previous VS check.   Last seen by Dr. Dina Rich on May 18, 2020. Noted 2 episodes of syncope a few months prior to office visit. Felt to be orthostatic, however was unclear if one episode was d/t losing her footing vs syncope. Carotid doppler was ordered.   Today she presents for overdue follow-up. Patient has some noticeable forgetfulness at times during the interview. She believes she has had one syncopal episode earlier this year, but not completely sure. She believes her episodes are due to low blood pressure. Denies any chest pain, shortness of breath, palpitations, dizziness, orthopnea, PND, swelling or significant weight changes, acute bleeding, or claudication.   ROS: Negative. See HPI.  Studies Reviewed: Marland Kitchen    EKG:  EKG Interpretation Date/Time:  Tuesday October 07 2022 14:40:53 EDT Ventricular Rate:  78 PR Interval:  138 QRS Duration:  72 QT Interval:  388 QTC Calculation: 442 R Axis:   84  Text Interpretation: Normal sinus rhythm Low voltage QRS When compared with ECG of 03-Feb-2017 09:49, No significant change was found Confirmed by Sharlene Dory [phone]) on 10/07/2022 2:46:51 PM   Carotid ultrasound 06/2012:  Essentially normal study.  Lexiscan 03/2010: Normal.  Physical Exam:   VS:  BP (!)  118/58   Pulse 78   Ht 5' (1.524 m)   Wt 103 lb 12.8 oz (47.1 kg)   BMI 20.27 kg/m    Wt Readings from Last 3 Encounters:  10/07/22 103 lb 12.8 oz (47.1 kg)  09/11/22 104 lb 12.8 oz (47.5 kg)  08/12/21 112 lb (50.8 kg)    GEN: Well nourished, well developed in no acute distress NECK: No JVD; No carotid bruits CARDIAC: S1/S2, RRR, no murmurs, rubs, gallops RESPIRATORY:  Clear to auscultation without rales, wheezing or rhonchi  ABDOMEN: Soft, non-tender, non-distended EXTREMITIES:  No edema; No deformity   ASSESSMENT AND PLAN: .    Syncope Etiology felt to be orthostatic.  Past orthostatics in office revealed drop in SBP of 18 points upon standing.  Patient believes past episodes related to blood pressure.  No indication for further cardiac workup at this time because description sounds orthostatic. Recommended adjusting her BP medication.  She declines adjusting her HCTZ dosage.  Will reduce olmesartan-HCTZ to 20 mg/12.5 mg daily, and she is agreeable to this. Discussed to monitor BP at home at least 2 hours after medications and sitting for 5-10 minutes.  Care and ED precautions discussed.  HTN BP stable today, however pt believes BP is low at times causing syncope episodes. Reducing olmesartan-HCTZ as mentioned above. Discussed to monitor BP at home at least 2 hours after medications and sitting for 5-10 minutes. Heart healthy diet and regular cardiovascular exercise encouraged.   PAD, HLD Denies any symptoms. LDL 91 07/2022. Continue pravastatin and plavix. Not addressed, but at next office visit, recommend increasing pravastatin  to further lower LDL. Heart healthy diet and regular cardiovascular exercise encouraged.   Dispo: Follow-up with Dr. Dina Rich or APP in 6 months or sooner if anything changes.   Signed, Sharlene Dory, NP

## 2022-10-07 NOTE — Patient Instructions (Addendum)
Medication Instructions:  Your physician has recommended you make the following change in your medication:  Start taking OLMESARTAN 20 Mg-hydrochlorothiazide 12.5Mg  daily  Continue all other medications as prescribed   Labwork: None  Testing/Procedures: None  Follow-Up: Your physician recommends that you schedule a follow-up appointment in: 6 Months   Any Other Special Instructions Will Be Listed Below (If Applicable).  If you need a refill on your cardiac medications before your next appointment, please call your pharmacy.

## 2022-10-09 ENCOUNTER — Encounter: Payer: Self-pay | Admitting: Nurse Practitioner

## 2022-10-14 ENCOUNTER — Ambulatory Visit: Payer: PPO

## 2022-10-14 ENCOUNTER — Ambulatory Visit
Admission: RE | Admit: 2022-10-14 | Discharge: 2022-10-14 | Disposition: A | Discharge status: Home / Self Care | Payer: PPO | Source: Ambulatory Visit | Attending: Adult Health | Admitting: Adult Health

## 2022-10-14 DIAGNOSIS — Z1231 Encounter for screening mammogram for malignant neoplasm of breast: Secondary | ICD-10-CM | POA: Diagnosis not present

## 2022-10-31 DIAGNOSIS — E871 Hypo-osmolality and hyponatremia: Secondary | ICD-10-CM | POA: Diagnosis not present

## 2022-10-31 DIAGNOSIS — D649 Anemia, unspecified: Secondary | ICD-10-CM | POA: Diagnosis not present

## 2022-12-12 ENCOUNTER — Inpatient Hospital Stay: Payer: PPO | Attending: Adult Health

## 2022-12-12 DIAGNOSIS — D649 Anemia, unspecified: Secondary | ICD-10-CM | POA: Insufficient documentation

## 2022-12-12 DIAGNOSIS — Z808 Family history of malignant neoplasm of other organs or systems: Secondary | ICD-10-CM | POA: Diagnosis not present

## 2022-12-12 DIAGNOSIS — Z86000 Personal history of in-situ neoplasm of breast: Secondary | ICD-10-CM | POA: Diagnosis not present

## 2022-12-12 DIAGNOSIS — Z923 Personal history of irradiation: Secondary | ICD-10-CM | POA: Diagnosis not present

## 2022-12-12 DIAGNOSIS — Z800 Family history of malignant neoplasm of digestive organs: Secondary | ICD-10-CM | POA: Diagnosis not present

## 2022-12-12 DIAGNOSIS — M810 Age-related osteoporosis without current pathological fracture: Secondary | ICD-10-CM | POA: Insufficient documentation

## 2022-12-12 DIAGNOSIS — E538 Deficiency of other specified B group vitamins: Secondary | ICD-10-CM | POA: Insufficient documentation

## 2022-12-12 LAB — CBC WITH DIFFERENTIAL (CANCER CENTER ONLY)
Abs Immature Granulocytes: 0.03 10*3/uL (ref 0.00–0.07)
Basophils Absolute: 0 10*3/uL (ref 0.0–0.1)
Basophils Relative: 1 %
Eosinophils Absolute: 0 10*3/uL (ref 0.0–0.5)
Eosinophils Relative: 1 %
HCT: 31.4 % — ABNORMAL LOW (ref 36.0–46.0)
Hemoglobin: 11 g/dL — ABNORMAL LOW (ref 12.0–15.0)
Immature Granulocytes: 1 %
Lymphocytes Relative: 23 %
Lymphs Abs: 1.3 10*3/uL (ref 0.7–4.0)
MCH: 33.4 pg (ref 26.0–34.0)
MCHC: 35 g/dL (ref 30.0–36.0)
MCV: 95.4 fL (ref 80.0–100.0)
Monocytes Absolute: 0.5 10*3/uL (ref 0.1–1.0)
Monocytes Relative: 9 %
Neutro Abs: 3.7 10*3/uL (ref 1.7–7.7)
Neutrophils Relative %: 65 %
Platelet Count: 367 10*3/uL (ref 150–400)
RBC: 3.29 MIL/uL — ABNORMAL LOW (ref 3.87–5.11)
RDW: 12.8 % (ref 11.5–15.5)
WBC Count: 5.6 10*3/uL (ref 4.0–10.5)
nRBC: 0 % (ref 0.0–0.2)

## 2022-12-12 LAB — IRON AND IRON BINDING CAPACITY (CC-WL,HP ONLY)
Iron: 39 ug/dL (ref 28–170)
Saturation Ratios: 8 % — ABNORMAL LOW (ref 10.4–31.8)
TIBC: 462 ug/dL — ABNORMAL HIGH (ref 250–450)
UIBC: 423 ug/dL (ref 148–442)

## 2022-12-12 LAB — FERRITIN: Ferritin: 6 ng/mL — ABNORMAL LOW (ref 11–307)

## 2022-12-12 LAB — VITAMIN B12: Vitamin B-12: 171 pg/mL — ABNORMAL LOW (ref 180–914)

## 2022-12-15 ENCOUNTER — Telehealth: Payer: Self-pay

## 2022-12-15 ENCOUNTER — Telehealth: Payer: Self-pay | Admitting: Adult Health

## 2022-12-15 NOTE — Telephone Encounter (Signed)
-----   Message from Noreene Filbert sent at 12/15/2022 11:15 AM EST ----- Please call patient about labs and verify whether she is taking b12 sublingual daily, and if she is taking oral Iron. I am concerned she needs IV iron and b12 shots.  Please schedule a visit with me, perhaps next Tuesday morning at 0830 virtual to discuss her labs and next steps  Thanks, LC ----- Message ----- From: Interface, Lab In [place] Sent: 12/12/2022   3:52 PM EST To: Loa Socks, NP

## 2022-12-15 NOTE — Telephone Encounter (Signed)
Called pt to confirm appt. time changed on 12/23/22 at 8:30 am to 11:45 am. Pt verbalize understanding.

## 2022-12-15 NOTE — Telephone Encounter (Addendum)
Pt is not taking b-12 or oral iron. She states that she would get some. Do she need to start taking it or do you want to discuss this when she has her virtual visit.----- Message from Noreene Filbert sent at 12/15/2022 11:15 AM EST ----- Please call patient about labs and verify whether she is taking b12 sublingual daily, and if she is taking oral Iron. I am concerned she needs IV iron and b12 shots.  Please schedule a visit with me, perhaps next Tuesday morning at 0830 virtual to discuss her labs and next steps  Thanks, LC ----- Message ----- From: Interface, Lab In [place] Sent: 12/12/2022   3:52 PM EST To: Loa Socks, NP

## 2022-12-15 NOTE — Telephone Encounter (Signed)
Patient aware of upcoming appointment

## 2022-12-15 NOTE — Telephone Encounter (Signed)
I would like to have a virtual visit with her to discuss her labs and next steps.

## 2022-12-20 ENCOUNTER — Emergency Department (HOSPITAL_COMMUNITY): Payer: PPO

## 2022-12-20 ENCOUNTER — Other Ambulatory Visit: Payer: Self-pay

## 2022-12-20 ENCOUNTER — Inpatient Hospital Stay (HOSPITAL_COMMUNITY)
Admission: EM | Admit: 2022-12-20 | Discharge: 2022-12-25 | DRG: 480 | Disposition: A | Discharge status: Skilled Nursing Facility | Payer: PPO | Attending: Family Medicine | Admitting: Family Medicine

## 2022-12-20 ENCOUNTER — Encounter (HOSPITAL_COMMUNITY): Payer: Self-pay | Admitting: Emergency Medicine

## 2022-12-20 DIAGNOSIS — R000 Tachycardia, unspecified: Secondary | ICD-10-CM | POA: Diagnosis not present

## 2022-12-20 DIAGNOSIS — R911 Solitary pulmonary nodule: Secondary | ICD-10-CM | POA: Diagnosis not present

## 2022-12-20 DIAGNOSIS — Z823 Family history of stroke: Secondary | ICD-10-CM

## 2022-12-20 DIAGNOSIS — Y9301 Activity, walking, marching and hiking: Secondary | ICD-10-CM | POA: Diagnosis present

## 2022-12-20 DIAGNOSIS — Z716 Tobacco abuse counseling: Secondary | ICD-10-CM

## 2022-12-20 DIAGNOSIS — Z881 Allergy status to other antibiotic agents status: Secondary | ICD-10-CM

## 2022-12-20 DIAGNOSIS — W109XXA Fall (on) (from) unspecified stairs and steps, initial encounter: Secondary | ICD-10-CM | POA: Diagnosis present

## 2022-12-20 DIAGNOSIS — Z825 Family history of asthma and other chronic lower respiratory diseases: Secondary | ICD-10-CM

## 2022-12-20 DIAGNOSIS — Z808 Family history of malignant neoplasm of other organs or systems: Secondary | ICD-10-CM

## 2022-12-20 DIAGNOSIS — E871 Hypo-osmolality and hyponatremia: Secondary | ICD-10-CM | POA: Insufficient documentation

## 2022-12-20 DIAGNOSIS — S72145A Nondisplaced intertrochanteric fracture of left femur, initial encounter for closed fracture: Secondary | ICD-10-CM | POA: Diagnosis not present

## 2022-12-20 DIAGNOSIS — D0512 Intraductal carcinoma in situ of left breast: Secondary | ICD-10-CM | POA: Diagnosis present

## 2022-12-20 DIAGNOSIS — Z888 Allergy status to other drugs, medicaments and biological substances status: Secondary | ICD-10-CM

## 2022-12-20 DIAGNOSIS — Z720 Tobacco use: Secondary | ICD-10-CM | POA: Diagnosis present

## 2022-12-20 DIAGNOSIS — S72022A Displaced fracture of epiphysis (separation) (upper) of left femur, initial encounter for closed fracture: Secondary | ICD-10-CM

## 2022-12-20 DIAGNOSIS — D649 Anemia, unspecified: Secondary | ICD-10-CM | POA: Diagnosis not present

## 2022-12-20 DIAGNOSIS — Z8249 Family history of ischemic heart disease and other diseases of the circulatory system: Secondary | ICD-10-CM

## 2022-12-20 DIAGNOSIS — Z800 Family history of malignant neoplasm of digestive organs: Secondary | ICD-10-CM

## 2022-12-20 DIAGNOSIS — Z91012 Allergy to eggs: Secondary | ICD-10-CM | POA: Diagnosis not present

## 2022-12-20 DIAGNOSIS — Z853 Personal history of malignant neoplasm of breast: Secondary | ICD-10-CM | POA: Diagnosis not present

## 2022-12-20 DIAGNOSIS — Z880 Allergy status to penicillin: Secondary | ICD-10-CM | POA: Diagnosis not present

## 2022-12-20 DIAGNOSIS — I739 Peripheral vascular disease, unspecified: Secondary | ICD-10-CM | POA: Diagnosis present

## 2022-12-20 DIAGNOSIS — Z91014 Allergy to mammalian meats: Secondary | ICD-10-CM | POA: Diagnosis not present

## 2022-12-20 DIAGNOSIS — J9601 Acute respiratory failure with hypoxia: Secondary | ICD-10-CM | POA: Diagnosis not present

## 2022-12-20 DIAGNOSIS — D72829 Elevated white blood cell count, unspecified: Secondary | ICD-10-CM | POA: Diagnosis present

## 2022-12-20 DIAGNOSIS — Z7982 Long term (current) use of aspirin: Secondary | ICD-10-CM | POA: Diagnosis not present

## 2022-12-20 DIAGNOSIS — S7292XA Unspecified fracture of left femur, initial encounter for closed fracture: Secondary | ICD-10-CM | POA: Diagnosis present

## 2022-12-20 DIAGNOSIS — J4489 Other specified chronic obstructive pulmonary disease: Secondary | ICD-10-CM | POA: Diagnosis present

## 2022-12-20 DIAGNOSIS — Z923 Personal history of irradiation: Secondary | ICD-10-CM

## 2022-12-20 DIAGNOSIS — R918 Other nonspecific abnormal finding of lung field: Secondary | ICD-10-CM | POA: Diagnosis not present

## 2022-12-20 DIAGNOSIS — Z7902 Long term (current) use of antithrombotics/antiplatelets: Secondary | ICD-10-CM

## 2022-12-20 DIAGNOSIS — E876 Hypokalemia: Secondary | ICD-10-CM | POA: Diagnosis present

## 2022-12-20 DIAGNOSIS — Z79899 Other long term (current) drug therapy: Secondary | ICD-10-CM | POA: Diagnosis not present

## 2022-12-20 DIAGNOSIS — S72102A Unspecified trochanteric fracture of left femur, initial encounter for closed fracture: Principal | ICD-10-CM

## 2022-12-20 DIAGNOSIS — S72002A Fracture of unspecified part of neck of left femur, initial encounter for closed fracture: Secondary | ICD-10-CM

## 2022-12-20 DIAGNOSIS — E782 Mixed hyperlipidemia: Secondary | ICD-10-CM | POA: Diagnosis present

## 2022-12-20 DIAGNOSIS — S72142A Displaced intertrochanteric fracture of left femur, initial encounter for closed fracture: Principal | ICD-10-CM | POA: Diagnosis present

## 2022-12-20 DIAGNOSIS — F17210 Nicotine dependence, cigarettes, uncomplicated: Secondary | ICD-10-CM | POA: Diagnosis not present

## 2022-12-20 DIAGNOSIS — D0511 Intraductal carcinoma in situ of right breast: Secondary | ICD-10-CM | POA: Diagnosis present

## 2022-12-20 DIAGNOSIS — Z7401 Bed confinement status: Secondary | ICD-10-CM | POA: Diagnosis not present

## 2022-12-20 DIAGNOSIS — I10 Essential (primary) hypertension: Secondary | ICD-10-CM | POA: Diagnosis not present

## 2022-12-20 DIAGNOSIS — Z882 Allergy status to sulfonamides status: Secondary | ICD-10-CM | POA: Diagnosis not present

## 2022-12-20 DIAGNOSIS — Y92009 Unspecified place in unspecified non-institutional (private) residence as the place of occurrence of the external cause: Secondary | ICD-10-CM

## 2022-12-20 DIAGNOSIS — K219 Gastro-esophageal reflux disease without esophagitis: Secondary | ICD-10-CM | POA: Diagnosis not present

## 2022-12-20 DIAGNOSIS — M25552 Pain in left hip: Secondary | ICD-10-CM | POA: Diagnosis not present

## 2022-12-20 DIAGNOSIS — Z79811 Long term (current) use of aromatase inhibitors: Secondary | ICD-10-CM

## 2022-12-20 DIAGNOSIS — R609 Edema, unspecified: Secondary | ICD-10-CM | POA: Diagnosis not present

## 2022-12-20 DIAGNOSIS — Z9889 Other specified postprocedural states: Secondary | ICD-10-CM | POA: Diagnosis not present

## 2022-12-20 DIAGNOSIS — R0600 Dyspnea, unspecified: Secondary | ICD-10-CM | POA: Diagnosis not present

## 2022-12-20 DIAGNOSIS — W19XXXA Unspecified fall, initial encounter: Secondary | ICD-10-CM | POA: Diagnosis not present

## 2022-12-20 LAB — COMPREHENSIVE METABOLIC PANEL
ALT: 13 U/L (ref 0–44)
AST: 16 U/L (ref 15–41)
Albumin: 3.7 g/dL (ref 3.5–5.0)
Alkaline Phosphatase: 49 U/L (ref 38–126)
Anion gap: 13 (ref 5–15)
BUN: 23 mg/dL (ref 8–23)
CO2: 23 mmol/L (ref 22–32)
Calcium: 9.3 mg/dL (ref 8.9–10.3)
Chloride: 93 mmol/L — ABNORMAL LOW (ref 98–111)
Creatinine, Ser: 0.78 mg/dL (ref 0.44–1.00)
GFR, Estimated: >60 mL/min (ref >60)
Glucose, Bld: 111 mg/dL — ABNORMAL HIGH (ref 70–99)
Potassium: 3.4 mmol/L — ABNORMAL LOW (ref 3.5–5.1)
Sodium: 129 mmol/L — ABNORMAL LOW (ref 135–145)
Total Bilirubin: 0.3 mg/dL (ref <1.2)
Total Protein: 6.9 g/dL (ref 6.5–8.1)

## 2022-12-20 LAB — CBC WITH DIFFERENTIAL/PLATELET
Abs Immature Granulocytes: 0.03 10*3/uL (ref 0.00–0.07)
Basophils Absolute: 0 10*3/uL (ref 0.0–0.1)
Basophils Relative: 1 %
Eosinophils Absolute: 0 10*3/uL (ref 0.0–0.5)
Eosinophils Relative: 1 %
HCT: 30.1 % — ABNORMAL LOW (ref 36.0–46.0)
Hemoglobin: 10.2 g/dL — ABNORMAL LOW (ref 12.0–15.0)
Immature Granulocytes: 1 %
Lymphocytes Relative: 23 %
Lymphs Abs: 1.3 10*3/uL (ref 0.7–4.0)
MCH: 32.6 pg (ref 26.0–34.0)
MCHC: 33.9 g/dL (ref 30.0–36.0)
MCV: 96.2 fL (ref 80.0–100.0)
Monocytes Absolute: 0.7 10*3/uL (ref 0.1–1.0)
Monocytes Relative: 11 %
Neutro Abs: 3.7 10*3/uL (ref 1.7–7.7)
Neutrophils Relative %: 63 %
Platelets: 350 10*3/uL (ref 150–400)
RBC: 3.13 MIL/uL — ABNORMAL LOW (ref 3.87–5.11)
RDW: 13 % (ref 11.5–15.5)
WBC: 5.7 10*3/uL (ref 4.0–10.5)
nRBC: 0 % (ref 0.0–0.2)

## 2022-12-20 LAB — BASIC METABOLIC PANEL
Anion gap: 6 (ref 5–15)
Anion gap: 8 (ref 5–15)
BUN: 14 mg/dL (ref 8–23)
BUN: 13 mg/dL (ref 8–23)
CO2: 25 mmol/L (ref 22–32)
CO2: 21 mmol/L — ABNORMAL LOW (ref 22–32)
Calcium: 8.3 mg/dL — ABNORMAL LOW (ref 8.9–10.3)
Calcium: 8.3 mg/dL — ABNORMAL LOW (ref 8.9–10.3)
Chloride: 99 mmol/L (ref 98–111)
Chloride: 101 mmol/L (ref 98–111)
Creatinine, Ser: 0.63 mg/dL (ref 0.44–1.00)
Creatinine, Ser: 0.59 mg/dL (ref 0.44–1.00)
GFR, Estimated: >60 mL/min (ref >60)
GFR, Estimated: >60 mL/min (ref >60)
Glucose, Bld: 98 mg/dL (ref 70–99)
Glucose, Bld: 130 mg/dL — ABNORMAL HIGH (ref 70–99)
Potassium: 4.2 mmol/L (ref 3.5–5.1)
Potassium: 3.8 mmol/L (ref 3.5–5.1)
Sodium: 130 mmol/L — ABNORMAL LOW (ref 135–145)
Sodium: 130 mmol/L — ABNORMAL LOW (ref 135–145)

## 2022-12-20 LAB — SURGICAL PCR SCREEN
MRSA, PCR: NEGATIVE
Staphylococcus aureus: POSITIVE — AB

## 2022-12-20 LAB — PHOSPHORUS: Phosphorus: 3.3 mg/dL (ref 2.5–4.6)

## 2022-12-20 LAB — SODIUM, URINE, RANDOM: Sodium, Ur: 106 mmol/L

## 2022-12-20 LAB — MAGNESIUM: Magnesium: 2.1 mg/dL (ref 1.7–2.4)

## 2022-12-20 MED ORDER — HYDRALAZINE HCL 20 MG/ML IJ SOLN: 10.0000 mg | Freq: Four times a day (QID) | INTRAMUSCULAR | Status: DC | PRN

## 2022-12-20 MED ORDER — SODIUM CHLORIDE 0.9 % IV BOLUS
1000.0000 mL | Freq: Once | INTRAVENOUS | Status: AC
  Administered 2022-12-20: 1000 mL via INTRAVENOUS

## 2022-12-20 MED ORDER — HYDROMORPHONE HCL 1 MG/ML IJ SOLN
0.5000 mg | INTRAMUSCULAR | Status: DC | PRN
  Administered 2022-12-20 – 2022-12-23 (×11): 0.5 mg via INTRAVENOUS
  Filled 2022-12-20 (×11): qty 0.5

## 2022-12-20 MED ORDER — METHOCARBAMOL 500 MG PO TABS
500.0000 mg | ORAL_TABLET | Freq: Three times a day (TID) | ORAL | Status: DC
  Administered 2022-12-20 – 2022-12-25 (×15): 500 mg via ORAL
  Filled 2022-12-20 (×14): qty 1

## 2022-12-20 MED ORDER — ACETAMINOPHEN 325 MG PO TABS: 650.0000 mg | ORAL_TABLET | Freq: Four times a day (QID) | ORAL | Status: DC | PRN

## 2022-12-20 MED ORDER — FENTANYL CITRATE PF 50 MCG/ML IJ SOSY
50.0000 ug | PREFILLED_SYRINGE | Freq: Once | INTRAMUSCULAR | Status: AC
  Administered 2022-12-20: 50 ug via INTRAVENOUS
  Filled 2022-12-20: qty 1

## 2022-12-20 MED ORDER — IRBESARTAN 150 MG PO TABS
150.0000 mg | ORAL_TABLET | Freq: Every day | ORAL | Status: DC
  Filled 2022-12-20: qty 1

## 2022-12-20 MED ORDER — UMECLIDINIUM BROMIDE 62.5 MCG/ACT IN AEPB
1.0000 | INHALATION_SPRAY | Freq: Every day | RESPIRATORY_TRACT | Status: DC
  Administered 2022-12-21 – 2022-12-25 (×5): 1 via RESPIRATORY_TRACT
  Filled 2022-12-20 (×2): qty 7

## 2022-12-20 MED ORDER — POTASSIUM CHLORIDE 10 MEQ/100ML IV SOLN
10.0000 meq | INTRAVENOUS | Status: AC
  Administered 2022-12-20 (×3): 10 meq via INTRAVENOUS
  Filled 2022-12-20 (×3): qty 100

## 2022-12-20 MED ORDER — MUPIROCIN 2 % EX OINT
1.0000 | TOPICAL_OINTMENT | Freq: Two times a day (BID) | CUTANEOUS | Status: AC
  Administered 2022-12-20 – 2022-12-25 (×10): 1 via NASAL
  Filled 2022-12-20: qty 22

## 2022-12-20 MED ORDER — SODIUM CHLORIDE 0.9 % IV SOLN: INTRAVENOUS | Status: AC

## 2022-12-20 MED ORDER — OXYCODONE HCL 5 MG PO TABS
5.0000 mg | ORAL_TABLET | ORAL | Status: DC | PRN
  Administered 2022-12-22 – 2022-12-25 (×6): 5 mg via ORAL
  Filled 2022-12-20 (×6): qty 1

## 2022-12-20 MED ORDER — ACETAMINOPHEN 650 MG RE SUPP: 650.0000 mg | Freq: Four times a day (QID) | RECTAL | Status: DC | PRN

## 2022-12-20 MED ORDER — ONDANSETRON HCL 4 MG PO TABS: 4.0000 mg | ORAL_TABLET | Freq: Four times a day (QID) | ORAL | Status: DC | PRN

## 2022-12-20 MED ORDER — ONDANSETRON HCL 4 MG/2ML IJ SOLN
4.0000 mg | Freq: Four times a day (QID) | INTRAMUSCULAR | Status: DC | PRN
  Administered 2022-12-21: 4 mg via INTRAVENOUS

## 2022-12-20 MED ORDER — NICOTINE 14 MG/24HR TD PT24
14.0000 mg | MEDICATED_PATCH | Freq: Every day | TRANSDERMAL | Status: DC
  Administered 2022-12-20 – 2022-12-25 (×7): 14 mg via TRANSDERMAL
  Filled 2022-12-20 (×6): qty 1

## 2022-12-20 MED ORDER — ALBUTEROL SULFATE (2.5 MG/3ML) 0.083% IN NEBU: 2.5000 mg | INHALATION_SOLUTION | RESPIRATORY_TRACT | Status: DC | PRN

## 2022-12-20 NOTE — H&P (Addendum)
History and Physical    Patient: Debra Henderson UEA:[id number] DOB: 08-05-1945 DOA: 12/20/2022 DOS: the patient was seen and examined on 12/20/2022 PCP: [name], MD  Patient coming from: Home  Chief Complaint:  Chief Complaint  Patient presents with   Fall   HPI: Debra Henderson is a 77 y.o. female with medical history significant of hypertension, hyperlipidemia, PAD who presents to the emergency department from home via EMS due to mechanical fall sustained while walking down the steps.  Patient was on Plavix due to PAD.  She denies hitting her head or loss of consciousness, but she complains of left hip pain.  She had a c-collar on arrival to the ED.  ED Course:  In the emergency department, temperature was 97.42F, respiratory rate 22/min, BP 142/74, other vital signs are within normal range.  Workup in the ED showed normocytic anemia, BMP was normal except for sodium of 129, potassium 3.4, chloride 93, blood glucose 111. Left hip x-ray showed acute fracture of the proximal left femur She was treated with IV fentanyl 50 mcg x 1, IV LR 1 L was provided.  Orthopedic surgeon (Dr. Romeo Apple) was consulted and recommended admitting patient with plan to consult on patient in the morning per EDP. Hospitalist was asked to admit patient for further evaluation and management.  Review of Systems: Review of systems as noted in the HPI. All other systems reviewed and are negative.   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, ER/PR+   Cancer (HCC) 2014   breast cancer   Chronic kidney disease    renal artery stenosis   Claudication (HCC)    lower extremities   Complication of anesthesia    ' It does not work " I am difficult to put tpo sleep   Family history of anesthesia complication    my brother is difficult to put to sleep also   GERD (gastroesophageal reflux disease)    otc   History of renal stent    LEA  DUPLEX, 03/16/2012 - LEFT EIA DISTAL/COMMON FEMORAL ARTERY-demonstrated occlusive disease   Hyperlipidemia    Hypertension    Incontinence    Peripheral vascular disease (HCC)    prior stenting of left iliac artery   Personal history of radiation therapy 2014   right breast   Personal history of radiation therapy 2019   left breast   Radiation 08/02/12-08/23/12   Right breast 42.72 Gy x 16 fx   Renal artery stenosis (HCC)    RENAL DOPPLER, 02/12/2011 - RIGHT RENAL ARTERY 60-99% diameter reduction, LEFT RENAL ARTERY AT STENT 60-99% diameter reduction   Past Surgical History:  Procedure Laterality Date   ANGIOPLASTY ILLIAC ARTERY  02/24/2012   Dr Allyson Sabal  L iliac restent   ATHERECTOMY N/A 02/24/2012   Procedure: ATHERECTOMY;  Surgeon: Runell Gess, MD;  Location: [facility] CATH LAB;  Service: Cardiovascular;  Laterality: N/A;   BLADDER SUSPENSION  1980's   bladder tack     BREAST BIOPSY Right 05/2013   BREAST BIOPSY Right 02/13/2012   BREAST BIOPSY Left 12/25/2016   BREAST LUMPECTOMY Right 05/02/2012   BREAST LUMPECTOMY Left 02/06/2017   BREAST LUMPECTOMY WITH NEEDLE LOCALIZATION Right 04/22/2012   Procedure: RIGHT BREAST WIRE LOCALIZATION  LUMPECTOMY ;  Surgeon: Robyne Askew, MD;  Location: [facility] OR;  Service: General;  Laterality: Right;   BREAST LUMPECTOMY WITH RADIOACTIVE SEED LOCALIZATION Left 02/06/2017  Procedure: BREAST LUMPECTOMY WITH RADIOACTIVE SEED LOCALIZATION;  Surgeon: Griselda Miner, MD;  Location: [facility];  Service: General;  Laterality: Left;   fibroid breast     left breast-adenoma benign 1980's   ILIAC ARTERY STENT  11/21/19/02   left    NM MYOVIEW LTD  03/21/2010   normal myocardial perfusion study   PERIPHERAL VASCULAR CATHETERIZATION Left 02/24/2012   Common iliac artery, 8x38 iCast Stent, resulting in a reduction from 80% in-stent restenosis to 0% residual   PERIPHERAL VASCULAR CATHETERIZATION Left 08/04/2006   Renal 90% stenosis, 3.5x 13 drug-eluting stent  resulting in a reduction of 90% in-stent restenosis to 0% residual   PERIPHERAL VASCULAR CATHETERIZATION Left 05/22/2005   Renal 80% in-stent stenosis, 5x15 Aviator resulting in a reduction of 80% in-stent restenosis to 0% residual   PERIPHERAL VASCULAR CATHETERIZATION Left 02/22/2002   Renal in-stent restenosis, 5x15 Guidant rapid-exchange balloon resulting in reduction of a 95% in-stent restenosis to less than 20% residual   PERIPHERAL VASCULAR CATHETERIZATION Left 12/10/2000   95% renal stenosis, 6x47mm Genesis Aviator balloon/stent deployed at 10 atmospheres resulting in a reduction  of 95% stenosis to 0% residual; Common iliac artery stenosis, P12x4 mounted on a 7x2 Powerflex balloon resulting in a reduction of 80% to 0% residual   PERIPHERAL VASCULAR CATHETERIZATION Left 07/27/2000   95% renal stenosis, 7mm x 6cm Smart stent resulting in a reduction of 90-95%  to 0% residual   stent /kidney  12/10/00,08/04/06   2002 R renal artery, 2008 L renal   TUBAL LIGATION     1980's    Social History:  reports that she quit smoking about 34 years ago. Her smoking use included cigarettes. She has never used smokeless tobacco. She reports that she does not drink alcohol and does not use drugs.   Allergies  Allergen Reactions   Other Other (See Comments)   Tetracyclines & Related Nausea And Vomiting and Other (See Comments)    Vomiting blood   Vytorin [Ezetimibe-Simvastatin] Other (See Comments)    Per Dr Erlene Quan note   Chicken Allergy Diarrhea, Rash and Other (See Comments)   Egg-Derived Products Diarrhea, Rash and Other (See Comments)   Erythromycin Rash   Lipitor [Atorvastatin] Rash and Other (See Comments)    Myalgias    Penicillins Rash, Swelling and Other (See Comments)   Sulfa Antibiotics Swelling and Rash    Family History  Problem Relation Age of Onset   Cancer Mother        colon   COPD Mother    Cancer Father        brain   Stroke Sister    Heart attack Sister    Breast cancer  Neg Hx      Prior to Admission medications   Medication Sig Start Date End Date Taking? Authorizing Provider  cholecalciferol (VITAMIN D3) 25 MCG (1000 UNIT) tablet Take 1,000 Units by mouth daily.    [provider]  clopidogrel (PLAVIX) 75 MG tablet TAKE1 TABLET EVERY DAY 01/01/17   [provider]  Cyanocobalamin (B-12) 1000 MCG SUBL Place 1,000 mcg under the tongue daily. Start 09/15/22    [provider]  ferrous sulfate 325 (65 FE) MG EC tablet Take 325 mg by mouth 3 (three) times daily with meals.    [provider]  olmesartan-hydrochlorothiazide (BENICAR HCT) 20-12.5 MG tablet Take 1 tablet by mouth daily. 10/07/22   Antoine Poche, MD  omega-3 acid ethyl esters (LOVAZA) 1 G capsule  Take 1 g by mouth 3 (three) times daily.    [provider]  pravastatin (PRAVACHOL) 10 MG tablet Take 10 mg by mouth daily.    [provider]    Physical Exam: BP 115/62 (BP Location: Left Arm)   Pulse 97   Temp 98.4 F (36.9 C) (Oral)   Resp (!) 22   Ht 5' (1.524 m)   Wt 47.3 kg   SpO2 94%   BMI 20.37 kg/m   General: 77 y.o. year-old female well developed well nourished in no acute distress.  Alert and oriented x3. HEENT: NCAT, EOMI Neck: Supple, trachea medial Cardiovascular: Regular rate and rhythm with no rubs or gallops.  No thyromegaly or JVD noted.  No lower extremity edema. 2/4 pulses in all 4 extremities. Respiratory: Clear to auscultation with no wheezes or rales. Good inspiratory effort. Abdomen: Soft, nontender nondistended with normal bowel sounds x4 quadrants. Muskuloskeletal: Tender to palpation of left hip.  Restricted ROM of LLE due to pain.  No cyanosis, clubbing or edema noted bilaterally Neuro: CN II-XII intact, strength 5/5 x 4, sensation, reflexes intact Skin: No ulcerative lesions noted or rashes Psychiatry: Judgement and insight appear normal. Mood is appropriate for condition and setting          Labs on  Admission:  Basic Metabolic Panel: Recent Labs  Lab 12/20/22 0317  NA 129*  K 3.4*  CL 93*  CO2 23  GLUCOSE 111*  BUN 23  CREATININE 0.78  CALCIUM 9.3   Liver Function Tests: Recent Labs  Lab 12/20/22 0317  AST 16  ALT 13  ALKPHOS 49  BILITOT 0.3  PROT 6.9  ALBUMIN 3.7   No results for input(s): "LIPASE", "AMYLASE" in the last 168 hours. No results for input(s): "AMMONIA" in the last 168 hours. CBC: Recent Labs  Lab 12/20/22 0317  WBC 5.7  NEUTROABS 3.7  HGB 10.2*  HCT 30.1*  MCV 96.2  PLT 350   Cardiac Enzymes: No results for input(s): "CKTOTAL", "CKMB", "CKMBINDEX", "TROPONINI" in the last 168 hours.  BNP (last 3 results) No results for input(s): "BNP" in the last 8760 hours.  ProBNP (last 3 results) No results for input(s): "PROBNP" in the last 8760 hours.  CBG: No results for input(s): "GLUCAP" in the last 168 hours.  Radiological Exams on Admission: DG Hip Unilat W or Wo Pelvis 2-3 Views Left  Result Date: 12/20/2022 CLINICAL DATA:  Status post trauma with subsequent left hip pain. EXAM: DG HIP (WITH OR WITHOUT PELVIS) 2-3V LEFT COMPARISON:  None Available. FINDINGS: There is an acute, mildly displaced fracture deformity seen extending through the inter trochanteric region of the proximal left femur. There is no evidence of dislocation. A radiopaque vascular stent is noted overlying the superomedial aspect of the pelvis on the left. IMPRESSION: Acute fracture of the proximal left femur. Electronically Signed   By: Aram Candela M.D.   On: 12/20/2022 02:30    EKG: I independently viewed the EKG done and my findings are as followed: Sinus tachycardia at rate of 100 bpm  Assessment/Plan Present on Admission:  Closed left femoral fracture (HCC)  Essential hypertension  Mixed hyperlipidemia  PAD (peripheral artery disease) (HCC)  Ductal carcinoma in situ (DCIS) of right breast  Ductal carcinoma in situ (DCIS) of left breast  Principal Problem:    Closed left femoral fracture (HCC) Active Problems:   PAD (peripheral artery disease) (HCC)   Ductal carcinoma in situ (DCIS) of right breast   Ductal carcinoma  in situ (DCIS) of left breast   Essential hypertension   Mixed hyperlipidemia   Hyponatremia   Hypokalemia   Closed left femoral fracture Left femoral hip x-ray showed acute fracture of proximal left femur Continue IV Dilaudid 0.5 mg every 3 hours as needed Continue fall precaution Orthopedic surgeon was consulted and will follow-up on patient in the morning per EDP  Hypokalemia K+ 3.4, this will be replenished  Hyponatremia Na 129, this may be due to diuretic effect Continue to monitor sodium with serial BMPs Urine osmolality, serum osmolality and urine sodium will be checked  Essential hypertension Continue Avapro  Mixed hyperlipidemia Continue pravastatin per home regimen  PAD Patient has had several peripheral artery catheterization procedures and has renal artery stent and iliac artery stent placement. Plavix will be temporarily held in anticipation of impending surgical intervention  History of breast cancer Patient had DCIS of both breasts status post lumpectomy She is no longer on any chemotherapy  DVT prophylaxis: SCDs  Code Status: Full code  Family Communication: None at bedside  Consults: Orthopedic surgery  Severity of Illness: The appropriate patient status for this patient is INPATIENT. Inpatient status is judged to be reasonable and necessary in order to provide the required intensity of service to ensure the patient's safety. The patient's presenting symptoms, physical exam findings, and initial radiographic and laboratory data in the context of their chronic comorbidities is felt to place them at high risk for further clinical deterioration. Furthermore, it is not anticipated that the patient will be medically stable for discharge from the hospital within 2 midnights of admission.   * I  certify that at the point of admission it is my clinical judgment that the patient will require inpatient hospital care spanning beyond 2 midnights from the point of admission due to high intensity of service, high risk for further deterioration and high frequency of surveillance required.*  Author: Frankey Shown, DO 12/20/2022 6:46 AM  For on call review [email or web address].

## 2022-12-20 NOTE — Progress Notes (Addendum)
PROGRESS NOTE   Debra Henderson, is a 77 y.o. female, DOB - 06-24-1945, RUE:[id number]  Admit date - 12/20/2022   Admitting Physician Frankey Shown, DO  Outpatient Primary MD for the patient is [name], MD  LOS - 0  Chief Complaint  Patient presents with   Fall        Brief Narrative:  77 y.o. female with medical history significant of hypertension, hyperlipidemia, PAD admitted on 12/20/2022 with left hip fracture after mechanical fall at home    -Assessment and Plan: 1)LT Hip Fx--- s/p mechanical fall at home while walking down the steps -Orthopedic consult requested for operative fixation--currently plan for 12/21/2022 after allowing for Plavix washout -As needed opiates for pain control -Further management by orthopedic team  2)HTN-hold AVapro -Continue with IV hydralazine as needed elevated BP  3)Tobacco abuse--smoking cessation advised Smoking cessation counseling for 4 minutes today,  I have discussed tobacco cessation with the patient.  I have counseled the patient regarding the negative impacts of continued tobacco use including but not limited to lung cancer, COPD, and cardiovascular disease.  I have discussed alternatives to tobacco and modalities that may help facilitate tobacco cessation including but not limited to biofeedback, hypnosis, and medications.  Total time spent with tobacco counseling was 4 minutes. -Give nicotine patch  4) acute hypoxic respiratory failure--suspect underlying COPD (patient is a smoker) -Currently requiring 2 L of oxygen via nasal cannula --Start Incruse Ellipta -As needed albuterol nebs -Incentive spirometry as ordered -Will try to wean off O2  Total care time 51 minutes  Status is: Inpatient   Disposition: The patient is from: Home              Anticipated d/c is to: SNF              Anticipated d/c date is: 2 days              Patient currently is not medically stable to d/c. Barriers: Not Clinically Stable-    Code Status :  -  Code Status: Full Code   Family Communication:    NA (patient is alert, awake and coherent)   DVT Prophylaxis  :   - SCDs  SCDs Start: 12/20/22 0607  Lab Results  Component Value Date   PLT 350 12/20/2022    Inpatient Medications  Scheduled Meds:  methocarbamol  500 mg Oral TID   nicotine  14 mg Transdermal Daily   Continuous Infusions:  sodium chloride 50 mL/hr at 12/20/22 0752   PRN Meds:.acetaminophen **OR** acetaminophen, hydrALAZINE, HYDROmorphone (DILAUDID) injection, ondansetron **OR** ondansetron (ZOFRAN) IV, oxyCODONE   Anti-infectives (From admission, onward)    None         Subjective: Debra Henderson today has no fevers, no emesis,  No chest pain,   Cough and hypoxia persist -Left hip pain with positional change and range of motion   Objective: Vitals:   12/20/22 0500 12/20/22 0508 12/20/22 0600 12/20/22 0624  BP: (!) 105/56  (!) 139/98 115/62  Pulse: 95   97  Resp: 19   (!) 22  Temp:  (!) 97.4 F (36.3 C)  98.4 F (36.9 C)  TempSrc:  Oral  Oral  SpO2: 94%   94%  Weight:    47.3 kg  Height:    5' (1.524 m)   No intake or output data in the 24 hours ending 12/20/22 1125 Filed Weights   12/20/22 0136 12/20/22 0624  Weight: 47.2 kg 47.3 kg    Physical  Exam  Gen:- Awake Alert,  in no apparent distress  HEENT:- [place].AT, No sclera icterus Nose-  2L/min Neck-Supple Neck,No JVD,.  Lungs-No wheezing, fair symmetrical air movement CV- S1, S2 normal, regular , 2/6 SM Abd-  +ve B.Sounds, Abd Soft, No tenderness,    Extremity/Skin:- No  edema, pedal pulses present  Psych-affect is appropriate, oriented x3 Neuro-no new focal deficits, no tremors MSK--left lower extremity is rotated and shortened, point tenderness over the left hip area  Data Reviewed: I have personally reviewed following labs and imaging studies  CBC: Recent Labs  Lab 12/20/22 0317  WBC 5.7  NEUTROABS 3.7  HGB 10.2*  HCT 30.1*  MCV 96.2  PLT 350    Basic Metabolic Panel: Recent Labs  Lab 12/20/22 0317 12/20/22 0650  NA 129*  --   K 3.4*  --   CL 93*  --   CO2 23  --   GLUCOSE 111*  --   BUN 23  --   CREATININE 0.78  --   CALCIUM 9.3  --   MG  --  2.1  PHOS  --  3.3   GFR: Estimated Creatinine Clearance: 43.6 mL/min (by C-G formula based on SCr of 0.78 mg/dL). Liver Function Tests: Recent Labs  Lab 12/20/22 0317  AST 16  ALT 13  ALKPHOS 49  BILITOT 0.3  PROT 6.9  ALBUMIN 3.7   Radiology Studies: DG Hip Unilat W or Wo Pelvis 2-3 Views Left  Result Date: 12/20/2022 CLINICAL DATA:  Status post trauma with subsequent left hip pain. EXAM: DG HIP (WITH OR WITHOUT PELVIS) 2-3V LEFT COMPARISON:  None Available. FINDINGS: There is an acute, mildly displaced fracture deformity seen extending through the inter trochanteric region of the proximal left femur. There is no evidence of dislocation. A radiopaque vascular stent is noted overlying the superomedial aspect of the pelvis on the left. IMPRESSION: Acute fracture of the proximal left femur. Electronically Signed   By: Aram Candela M.D.   On: 12/20/2022 02:30     Scheduled Meds:  methocarbamol  500 mg Oral TID   nicotine  14 mg Transdermal Daily   Continuous Infusions:  sodium chloride 50 mL/hr at 12/20/22 0752     LOS: 0 days    Shon Hale M.D on 12/20/2022 at 11:25 AM  Go to [email or web address] - for contact info  [facility] - Office  [phone]  If 7PM-7AM, please contact night-coverage [email or web address] 12/20/2022, 11:25 AM

## 2022-12-20 NOTE — Progress Notes (Signed)
Latest Reference Range & Units 12/20/22 15:36  MRSA, PCR NEGATIVE  NEGATIVE    Latest Reference Range & Units 12/20/22 15:36  Staphylococcus aureus NEGATIVE  POSITIVE !  !: Data is abnormal   [place], DO notified via secure chat.

## 2022-12-20 NOTE — Progress Notes (Signed)
Patient ID: Debra Henderson, female   DOB: 18-Oct-1945, 77 y.o.   MRN: [id number]  Left hip frx   Requires operative repair   Px on plavix req 24 hrs waiting period   Surgery Sunday AM at 10 am

## 2022-12-20 NOTE — TOC Initial Note (Signed)
Transition of Care [facility]) - Initial/Assessment Note    Patient Details  Name: Debra Henderson MRN: [id number] Date of Birth: 09/28/1945  Transition of Care [facility]) CM/SW Contact:    Annice Needy, LCSW Phone Number: 12/20/2022, 11:27 AM  Clinical Narrative:                 Patient from home alone. Very independent at baseline, drives. Per granddaughter, Dahlia Client, home is "a bit crowded." Patient sits on the couch and watches television often. Home has 2-3 steps to enter depending on entrance. Discussed the possibility of SNF after surgery. Dahlia Client states that the family has discussed this as a possibility and will discuss with patient. They do not feel patient will like the idea but will speak with her about the potential for rehab.   Expected Discharge Plan: Skilled Nursing Facility Barriers to Discharge: Other (must enter comment) (surgery scheduled for tomorrow, then will need PT eval and auth.)   Patient Goals and CMS Choice Patient states their goals for this hospitalization and ongoing recovery are:: family is considering rehab and will speak with patient          Expected Discharge Plan and Services       Living arrangements for the past 2 months: Single Family Home                                      Prior Living Arrangements/Services Living arrangements for the past 2 months: Single Family Home Lives with:: Self   Do you feel safe going back to the place where you live?: Yes               Activities of Daily Living   ADL Screening (condition at time of admission) Independently performs ADLs?: Yes (appropriate for developmental age) Is the patient deaf or have difficulty hearing?: Yes (pt says she has trouble hearing) Does the patient have difficulty seeing, even when wearing glasses/contacts?: No Does the patient have difficulty concentrating, remembering, or making decisions?: No  Permission Sought/Granted Permission sought to share information  with : Facility Medical sales representative (granddaughter, Dahlia Client, was in room)    Share Information with NAME: granddaughter, Dahlia Client was in room           Emotional Assessment           Psych Involvement: No (comment)  Admission diagnosis:  Closed left femoral fracture (HCC) [S72.92XA] Closed pertrochanteric fracture of left femur, initial encounter (HCC) [S72.102A] Patient Active Problem List   Diagnosis Date Noted   Closed left femoral fracture (HCC) 12/20/2022   Hyponatremia 12/20/2022   Hypokalemia 12/20/2022   B12 deficiency 09/21/2021   Osteoporosis 09/14/2017   Essential hypertension 02/04/2017   Mixed hyperlipidemia 02/04/2017   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  Left lower extremity. 02/25/2012   PCP:  [name], MD Pharmacy:   [facility] [phone] - [place], [place] - [address] [place] [address] Phone: [phone] Fax: [phone]     Social Determinants of Health (SDOH) Social History: SDOH Screenings   Food Insecurity: No Food Insecurity (12/20/2022)  Housing: Low Risk  (12/20/2022)  Transportation Needs: No Transportation  Needs (12/20/2022)  Utilities: Not At Risk (12/20/2022)  Social Connections: Unknown (06/04/2021)   Received from [facility]  Tobacco Use: Medium Risk (12/20/2022)   SDOH Interventions:     Readmission Risk Interventions     No data to display

## 2022-12-20 NOTE — Plan of Care (Signed)

## 2022-12-20 NOTE — ED Triage Notes (Signed)
Patient bib ems from home for mechanical fall while walking down steps. Denies loc or head trauma. On plavix. Patient complaining of left hip pain. C collar in place on arrival.

## 2022-12-21 ENCOUNTER — Inpatient Hospital Stay (HOSPITAL_COMMUNITY): Payer: PPO

## 2022-12-21 ENCOUNTER — Encounter (HOSPITAL_COMMUNITY)
Admission: EM | Disposition: A | Discharge status: Skilled Nursing Facility | Payer: Self-pay | Source: Home / Self Care | Attending: Family Medicine

## 2022-12-21 ENCOUNTER — Inpatient Hospital Stay (HOSPITAL_COMMUNITY): Payer: PPO | Admitting: Anesthesiology

## 2022-12-21 DIAGNOSIS — S72145A Nondisplaced intertrochanteric fracture of left femur, initial encounter for closed fracture: Secondary | ICD-10-CM | POA: Diagnosis not present

## 2022-12-21 DIAGNOSIS — I739 Peripheral vascular disease, unspecified: Secondary | ICD-10-CM | POA: Diagnosis not present

## 2022-12-21 DIAGNOSIS — I10 Essential (primary) hypertension: Secondary | ICD-10-CM | POA: Diagnosis not present

## 2022-12-21 DIAGNOSIS — S72002A Fracture of unspecified part of neck of left femur, initial encounter for closed fracture: Secondary | ICD-10-CM | POA: Diagnosis not present

## 2022-12-21 HISTORY — PX: INTRAMEDULLARY (IM) NAIL INTERTROCHANTERIC: SHX5875

## 2022-12-21 LAB — COMPREHENSIVE METABOLIC PANEL
ALT: 9 U/L (ref 0–44)
AST: 15 U/L (ref 15–41)
Albumin: 2.9 g/dL — ABNORMAL LOW (ref 3.5–5.0)
Alkaline Phosphatase: 43 U/L (ref 38–126)
Anion gap: 8 (ref 5–15)
BUN: 11 mg/dL (ref 8–23)
CO2: 22 mmol/L (ref 22–32)
Calcium: 8.4 mg/dL — ABNORMAL LOW (ref 8.9–10.3)
Chloride: 97 mmol/L — ABNORMAL LOW (ref 98–111)
Creatinine, Ser: 0.55 mg/dL (ref 0.44–1.00)
GFR, Estimated: >60 mL/min (ref >60)
Glucose, Bld: 107 mg/dL — ABNORMAL HIGH (ref 70–99)
Potassium: 3.7 mmol/L (ref 3.5–5.1)
Sodium: 127 mmol/L — ABNORMAL LOW (ref 135–145)
Total Bilirubin: 0.6 mg/dL (ref <1.2)
Total Protein: 5.8 g/dL — ABNORMAL LOW (ref 6.5–8.1)

## 2022-12-21 LAB — CBC
HCT: 27.6 % — ABNORMAL LOW (ref 36.0–46.0)
Hemoglobin: 9.2 g/dL — ABNORMAL LOW (ref 12.0–15.0)
MCH: 32.6 pg (ref 26.0–34.0)
MCHC: 33.3 g/dL (ref 30.0–36.0)
MCV: 97.9 fL (ref 80.0–100.0)
Platelets: 269 10*3/uL (ref 150–400)
RBC: 2.82 MIL/uL — ABNORMAL LOW (ref 3.87–5.11)
RDW: 13.1 % (ref 11.5–15.5)
WBC: 8.9 10*3/uL (ref 4.0–10.5)
nRBC: 0 % (ref 0.0–0.2)

## 2022-12-21 LAB — OSMOLALITY, URINE: Osmolality, Ur: 643 mosm/kg (ref 300–900)

## 2022-12-21 LAB — OSMOLALITY: Osmolality: 272 mosm/kg — ABNORMAL LOW (ref 275–295)

## 2022-12-21 SURGERY — FIXATION, FRACTURE, INTERTROCHANTERIC, WITH INTRAMEDULLARY ROD
Anesthesia: General | Site: Hip | Laterality: Left

## 2022-12-21 MED ORDER — POVIDONE-IODINE 10 % EX SWAB: 2.0000 | Freq: Once | CUTANEOUS | Status: DC

## 2022-12-21 MED ORDER — FENTANYL CITRATE (PF) 100 MCG/2ML IJ SOLN
INTRAMUSCULAR | Status: AC
  Filled 2022-12-21: qty 2

## 2022-12-21 MED ORDER — SODIUM CHLORIDE 0.9 % IR SOLN
Status: DC | PRN
  Administered 2022-12-21: 1000 mL

## 2022-12-21 MED ORDER — TRANEXAMIC ACID-NACL 1000-0.7 MG/100ML-% IV SOLN
INTRAVENOUS | Status: DC | PRN
  Administered 2022-12-21: 1000 mg via INTRAVENOUS

## 2022-12-21 MED ORDER — PROPOFOL 10 MG/ML IV BOLUS
INTRAVENOUS | Status: DC | PRN
  Administered 2022-12-21: 120 mg via INTRAVENOUS

## 2022-12-21 MED ORDER — ONDANSETRON HCL 4 MG/2ML IJ SOLN
INTRAMUSCULAR | Status: AC
  Filled 2022-12-21: qty 2

## 2022-12-21 MED ORDER — CEFAZOLIN SODIUM-DEXTROSE 2-4 GM/100ML-% IV SOLN: 2.0000 g | INTRAVENOUS | Status: DC

## 2022-12-21 MED ORDER — EPHEDRINE 5 MG/ML INJ
INTRAVENOUS | Status: AC
Start: 2022-12-21 — End: ?
  Filled 2022-12-21: qty 5

## 2022-12-21 MED ORDER — PHENYLEPHRINE 80 MCG/ML (10ML) SYRINGE FOR IV PUSH (FOR BLOOD PRESSURE SUPPORT)
PREFILLED_SYRINGE | INTRAVENOUS | Status: AC
Start: 2022-12-21 — End: ?
  Filled 2022-12-21: qty 10

## 2022-12-21 MED ORDER — LIDOCAINE HCL (CARDIAC) PF 100 MG/5ML IV SOSY
PREFILLED_SYRINGE | INTRAVENOUS | Status: DC | PRN
  Administered 2022-12-21: 100 mg via INTRAVENOUS

## 2022-12-21 MED ORDER — CEFAZOLIN SODIUM-DEXTROSE 2-4 GM/100ML-% IV SOLN
INTRAVENOUS | Status: AC
  Filled 2022-12-21: qty 100

## 2022-12-21 MED ORDER — ROCURONIUM BROMIDE 100 MG/10ML IV SOLN
INTRAVENOUS | Status: DC | PRN
  Administered 2022-12-21: 50 mg via INTRAVENOUS

## 2022-12-21 MED ORDER — MIDAZOLAM HCL 5 MG/5ML IJ SOLN
INTRAMUSCULAR | Status: DC | PRN
  Administered 2022-12-21: 2 mg via INTRAVENOUS

## 2022-12-21 MED ORDER — BUPIVACAINE-EPINEPHRINE 0.5% -1:200000 IJ SOLN
INTRAMUSCULAR | Status: DC | PRN
Start: 2022-12-21 — End: 2022-12-21
  Administered 2022-12-21: 30 mL

## 2022-12-21 MED ORDER — OXYCODONE HCL 5 MG/5ML PO SOLN: 5.0000 mg | Freq: Once | ORAL | Status: DC | PRN

## 2022-12-21 MED ORDER — KETOROLAC TROMETHAMINE 30 MG/ML IJ SOLN
INTRAMUSCULAR | Status: DC | PRN
  Administered 2022-12-21: 30 mg via INTRAVENOUS

## 2022-12-21 MED ORDER — ROCURONIUM BROMIDE 10 MG/ML (PF) SYRINGE
PREFILLED_SYRINGE | INTRAVENOUS | Status: AC
  Filled 2022-12-21: qty 10

## 2022-12-21 MED ORDER — EPHEDRINE SULFATE (PRESSORS) 50 MG/ML IJ SOLN
INTRAMUSCULAR | Status: DC | PRN
  Administered 2022-12-21 (×5): 5 mg via INTRAVENOUS
  Administered 2022-12-21: 10 mg via INTRAVENOUS

## 2022-12-21 MED ORDER — PHENYLEPHRINE HCL (PRESSORS) 10 MG/ML IV SOLN
INTRAVENOUS | Status: DC | PRN
  Administered 2022-12-21 (×17): 50 ug via INTRAVENOUS
  Administered 2022-12-21: 80 ug via INTRAVENOUS
  Administered 2022-12-21: 50 ug via INTRAVENOUS
  Administered 2022-12-21: 100 ug via INTRAVENOUS

## 2022-12-21 MED ORDER — MIDAZOLAM HCL 2 MG/2ML IJ SOLN
INTRAMUSCULAR | Status: AC
  Filled 2022-12-21: qty 2

## 2022-12-21 MED ORDER — LIDOCAINE HCL (PF) 2 % IJ SOLN
INTRAMUSCULAR | Status: AC
  Filled 2022-12-21: qty 5

## 2022-12-21 MED ORDER — OXYCODONE HCL 5 MG PO TABS: 5.0000 mg | ORAL_TABLET | Freq: Once | ORAL | Status: DC | PRN

## 2022-12-21 MED ORDER — TRANEXAMIC ACID-NACL 1000-0.7 MG/100ML-% IV SOLN
INTRAVENOUS | Status: AC
  Filled 2022-12-21: qty 100

## 2022-12-21 MED ORDER — HYDROMORPHONE HCL 1 MG/ML IJ SOLN: 0.2500 mg | INTRAMUSCULAR | Status: DC | PRN

## 2022-12-21 MED ORDER — CHLORHEXIDINE GLUCONATE 4 % EX SOLN: 60.0000 mL | Freq: Once | CUTANEOUS | Status: DC

## 2022-12-21 MED ORDER — CEFAZOLIN SODIUM-DEXTROSE 2-3 GM-%(50ML) IV SOLR
INTRAVENOUS | Status: DC | PRN
  Administered 2022-12-21: 2 g via INTRAVENOUS

## 2022-12-21 MED ORDER — SUGAMMADEX SODIUM 200 MG/2ML IV SOLN
INTRAVENOUS | Status: DC | PRN
  Administered 2022-12-21: 200 mg via INTRAVENOUS

## 2022-12-21 MED ORDER — SODIUM CHLORIDE 0.9 % IV SOLN: INTRAVENOUS | Status: DC

## 2022-12-21 MED ORDER — BUPIVACAINE-EPINEPHRINE (PF) 0.5% -1:200000 IJ SOLN
INTRAMUSCULAR | Status: AC
  Filled 2022-12-21: qty 30

## 2022-12-21 MED ORDER — TRANEXAMIC ACID-NACL 1000-0.7 MG/100ML-% IV SOLN: 1000.0000 mg | INTRAVENOUS | Status: DC

## 2022-12-21 MED ORDER — DEXAMETHASONE SODIUM PHOSPHATE 10 MG/ML IJ SOLN
INTRAMUSCULAR | Status: AC
Start: 2022-12-21 — End: ?
  Filled 2022-12-21: qty 1

## 2022-12-21 MED ORDER — SODIUM CHLORIDE 0.9 % IV SOLN: INTRAVENOUS | Status: DC | PRN

## 2022-12-21 MED ORDER — KETOROLAC TROMETHAMINE 30 MG/ML IJ SOLN
INTRAMUSCULAR | Status: AC
Start: 2022-12-21 — End: ?
  Filled 2022-12-21: qty 1

## 2022-12-21 MED ORDER — FENTANYL CITRATE (PF) 100 MCG/2ML IJ SOLN
INTRAMUSCULAR | Status: DC | PRN
  Administered 2022-12-21: 50 ug via INTRAVENOUS
  Administered 2022-12-21 (×3): 25 ug via INTRAVENOUS
  Administered 2022-12-21: 50 ug via INTRAVENOUS
  Administered 2022-12-21: 25 ug via INTRAVENOUS

## 2022-12-21 MED ORDER — DEXAMETHASONE SODIUM PHOSPHATE 10 MG/ML IJ SOLN
INTRAMUSCULAR | Status: DC | PRN
  Administered 2022-12-21: 10 mg via INTRAVENOUS

## 2022-12-21 SURGICAL SUPPLY — 53 items
BENZOIN TINCTURE PRP APPL 2/3 (GAUZE/BANDAGES/DRESSINGS) IMPLANT
BIT DRILL AO GAMMA 4.2X180 (BIT) IMPLANT
BIT DRILL AO GAMMA 4.2X300 (BIT) ×1 IMPLANT
BLADE SURG SZ10 CARB STEEL (BLADE) ×2 IMPLANT
BNDG GAUZE ELAST 4 BULKY (GAUZE/BANDAGES/DRESSINGS) ×1 IMPLANT
CHLORAPREP W/TINT 26 (MISCELLANEOUS) ×1 IMPLANT
CLOTH BEACON ORANGE TIMEOUT ST (SAFETY) ×1 IMPLANT
CLSR STERI-STRIP ANTIMIC 1/2X4 (GAUZE/BANDAGES/DRESSINGS) IMPLANT
COVER LIGHT HANDLE STERIS (MISCELLANEOUS) ×2 IMPLANT
COVER MAYO STAND XLG (MISCELLANEOUS) ×1 IMPLANT
COVER PERINEAL POST (MISCELLANEOUS) ×1 IMPLANT
DECANTER SPIKE VIAL GLASS SM (MISCELLANEOUS) ×2 IMPLANT
DRAPE STERI IOBAN 125X83 (DRAPES) ×1 IMPLANT
DRSG MEPILEX POST OP 4X12 (GAUZE/BANDAGES/DRESSINGS) IMPLANT
DRSG MEPILEX SACRM 8.7X9.8 (GAUZE/BANDAGES/DRESSINGS) ×1 IMPLANT
DRSG PAD ABDOMINAL 8X10 ST (GAUZE/BANDAGES/DRESSINGS) ×1 IMPLANT
ELECT REM PT RETURN 9FT ADLT (ELECTROSURGICAL) ×1
ELECTRODE REM PT RTRN 9FT ADLT (ELECTROSURGICAL) ×1 IMPLANT
GLOVE BIOGEL PI IND STRL 7.0 (GLOVE) ×2 IMPLANT
GLOVE SS N UNI LF 8.5 STRL (GLOVE) ×1 IMPLANT
GLOVE SURG POLYISO LF SZ8 (GLOVE) ×1 IMPLANT
GOWN STRL REUS W/TWL LRG LVL3 (GOWN DISPOSABLE) ×1 IMPLANT
GOWN STRL REUS W/TWL XL LVL3 (GOWN DISPOSABLE) ×1 IMPLANT
GUIDEROD T2 3X1000 (ROD) ×1 IMPLANT
INST SET MAJOR BONE (KITS) ×1 IMPLANT
K-WIRE 3.2X450M STR (WIRE) ×1
KIT BLADEGUARD II DBL (SET/KITS/TRAYS/PACK) ×1 IMPLANT
KIT TURNOVER CYSTO (KITS) ×1 IMPLANT
KWIRE 3.2X450M STR (WIRE) ×1 IMPLANT
MANIFOLD NEPTUNE II (INSTRUMENTS) ×1 IMPLANT
MARKER SKIN DUAL TIP RULER LAB (MISCELLANEOUS) ×1 IMPLANT
NAIL TROCH GAMMA 11X18 (Nail) IMPLANT
NDL HYPO 21X1.5 SAFETY (NEEDLE) ×1 IMPLANT
NDL SPNL 18GX3.5 QUINCKE PK (NEEDLE) ×1 IMPLANT
NEEDLE HYPO 21X1.5 SAFETY (NEEDLE) ×1 IMPLANT
NEEDLE SPNL 18GX3.5 QUINCKE PK (NEEDLE) ×1 IMPLANT
NS IRRIG 1000ML POUR BTL (IV SOLUTION) ×1 IMPLANT
PACK SRG BSC III STRL LF ECLPS (CUSTOM PROCEDURE TRAY) ×1 IMPLANT
PAD ARMBOARD 7.5X6 YLW CONV (MISCELLANEOUS) ×1 IMPLANT
PENCIL SMOKE EVACUATOR COATED (MISCELLANEOUS) ×1 IMPLANT
POSITIONER HEAD 8X9X4 ADT (SOFTGOODS) ×1 IMPLANT
SCREW LAG GAMMA 3 95MM (Screw) IMPLANT
SCREW LOCKING T2 F/T 5MMX35MM (Screw) IMPLANT
SET BASIN LINEN APH (SET/KITS/TRAYS/PACK) ×1 IMPLANT
SPONGE T-LAP 18X18 ~~LOC~~+RFID (SPONGE) ×2 IMPLANT
STRIP CLOSURE SKIN 1/2X4 (GAUZE/BANDAGES/DRESSINGS) IMPLANT
SUT BRALON NAB BRD #1 30IN (SUTURE) ×1 IMPLANT
SUT MNCRL 0 VIOLET CTX 36 (SUTURE) ×1 IMPLANT
SUT MON AB 2-0 CT1 36 (SUTURE) ×1 IMPLANT
SYR 30ML LL (SYRINGE) ×1 IMPLANT
SYR BULB IRRIG 60ML STRL (SYRINGE) ×2 IMPLANT
TRAY FOLEY MTR SLVR 16FR STAT (SET/KITS/TRAYS/PACK) ×1 IMPLANT
YANKAUER SUCT BULB TIP NO VENT (SUCTIONS) ×1 IMPLANT

## 2022-12-21 NOTE — Anesthesia Preprocedure Evaluation (Addendum)
Anesthesia Evaluation  Patient identified by MRN, date of birth, ID band Patient awake    Reviewed: Allergy & Precautions, H&P , NPO status , Patient's Chart, lab work & pertinent test results  History of Anesthesia Complications (+) Family history of anesthesia reaction  Airway Mallampati: I  TM Distance: >3 FB Neck ROM: Full    Dental  (+) Missing, Dental Advisory Given All front teeth in good shape but missing many molars top:   Pulmonary asthma , former smoker Quit smoking 1991.  Good  lung function. Occasionally uses inhaler.   Pulmonary exam normal breath sounds clear to auscultation       Cardiovascular hypertension, Pt. on medications + Peripheral Vascular Disease  Normal cardiovascular exam Rhythm:Regular Rate:Normal  claudication   Neuro/Psych negative neurological ROS  negative psych ROS   GI/Hepatic Neg liver ROS,GERD  ,,  Endo/Other    Renal/GU Renal diseaseRenal artery stenosis with stent placed  negative genitourinary   Musculoskeletal   Abdominal Normal abdominal exam  (+)   Peds  Hematology  (+) Blood dyscrasia, anemia HLD   Anesthesia Other Findings Breast Cancer. Hyponatremic  Reproductive/Obstetrics                             Anesthesia Physical Anesthesia Plan  ASA: 3  Anesthesia Plan: General   Post-op Pain Management: Dilaudid IV   Induction: Intravenous  PONV Risk Score and Plan: 3 and Ondansetron, Dexamethasone, Midazolam and Treatment may vary due to age or medical condition  Airway Management Planned: Oral ETT  Additional Equipment: None  Intra-op Plan:   Post-operative Plan: Extubation in OR  Informed Consent: I have reviewed the patients History and Physical, chart, labs and discussed the procedure including the risks, benefits and alternatives for the proposed anesthesia with the patient or authorized representative who has indicated his/her  understanding and acceptance.     Dental advisory given  Plan Discussed with: CRNA  Anesthesia Plan Comments:         Anesthesia Quick Evaluation

## 2022-12-21 NOTE — Op Note (Signed)
Open treatment internal fixation of the hip with CEPHALOMEDULLARY nail  Orthopaedic Surgery Operative Note (CSN: [id number])  Debra Henderson  August 27, 1945 Date of Surgery: 12/21/2022   Diagnoses:  left hip fracture/intertrochanteric fracture  Procedure: Open treatment internal fixation left hip with IM nail   TXA yes   Operative Finding 2 part fracture with greater Kanter avulsion lesser trochanter intact Slight external rotation no displacement  Post-Op Diagnosis: Same Surgeons:Primary: Vickki Hearing, MD Assistants: None Location: [facility] OR ROOM 4 Anesthesia: General Antibiotics: Ancef 2 g Tourniquet time: n/a Estimated Blood Loss: 150cc Complications: Failure of distal locking mechanism, apparent screw left in to prevent stress riser Specimens: None   Implants: Implant Name Type Inv. Item Serial No. Manufacturer Lot No. LRB No. Used Action  NAIL [facility] GAMMA 11X18 - [id number] Nail NAIL TROCH GAMMA 11X18  [employer] [id number] Left 1 Implanted  SCREW LAG GAMMA 3 - [id number] Screw SCREW LAG GAMMA 3  [employer] [id number] Left 1 Implanted  SCREW LOCKING T2 F/T 5MMX35MM - [id number] Screw SCREW LOCKING T2 F/T 5MMX35MM  [employer] [id number] Left 1 Implanted  SCREW LOCKING T2 F/T 5MMX35MM - [id number] Screw SCREW LOCKING T2 F/T 5MMX35MM  [employer] [id number] Left 1 Implanted    BLOOD ADMINISTERED:none  DRAINS: none   LOCAL MEDICATIONS USED: Marcaine with epinephrine   SPECIMEN:  No Specimen  DISPOSITION OF SPECIMEN:  N/A  COUNTS:  CORRECT   DICTATION: .Dragon Dictation   Surgeon Romeo Apple  The patient was taken to the recovery room in stable condition  PLAN OF CARE: Discharge to home after PACU  PATIENT DISPOSITION:  PACU - hemodynamically stable.   Delay start of Pharmacological VTE agent (>24hrs) due to surgical blood loss or risk of bleeding: Not applicable    The surgery was done as follows: The patient was  identified in the preop area the surgical site (left lower extremity) was confirmed and marked after thorough chart review including radiographs; implants were checked personally.  The patient was brought back to the operating room for anesthesia and then was placed on the fracture table. The operative leg was placed in traction the nonoperative leg was padded and placed in a boot and abducted  The C-arm was brought in and a closed reduction was performed with the C-arm.  Multiplane x-rays were taken and the leg was manipulated until a stable reduction was obtained using traction to obtain proper neck shaft angle and then rotation to correct rotational malalignment  The left lower extremity/leg was prepped and draped early, this was followed by timeout which confirmed as surgical site, implants, x-ray gowns and badges.  The incision was made over the left hip 3 fingerbreadths proximal to the greater trochanter, the subcutaneous tissue was divided down to the fascial layer which was split in line with the skin and incision.  This was followed by blunt dissection with a finger down to the tip of the trochanter  The guidepin was passed through the greater trochanter and down into the femoral shaft and confirmed on AP and lateral imaging  This was followed by reaming over the guidewire.  The nail was passed over the guidewire and confirmed to be in good position with orthogonal imaging   A second incision was made distal to the initial incision and the subcutaneous tissue was divided, muscle and fascia were split down to bone   After correction for rotation (a perforating drill was used to breech the cortex of the lateral femur followed by)  insertion of a threaded guidewire which was placed in the center of the femoral head on lateral x-ray and just inferior to the center position on the AP x-ray.  We measured the pin distance at 95 and then set the reamer to the same number  (95)  followed by reaming  over the guidewire.  The lag screw was then placed over the guidewire and the guidewire was removed.   We next use the external guide to place a third incision and then passed a cannula with a sharp tip followed by drilling with a 4.2 drill bit however there was abnormal sound made when drilling so the drilling was halted.  Further imaging was performed and the cannula mechanism seem to be toggling at the external jig  After several attempts the screw was passed images show that it was not in the correct position but was left in place to prevent a stress riser.  The screw was then placed using freehand technique  Further imaging confirmed the screws in the distal part of the nail  Final images confirmed reduction of the fracture and hardware position.  The wound was irrigated with copious amounts of saline and closed in layered fashion starting  #1 Bralon, 0 Monocryl and then a second 2-0 Monocryl in subcu fashion for the proximal incision and then 2-0 Monocryl only for the 2 distal incisions   We used 30 ml of Marcaine with epinephrine divided 20 proximal 5 in each distal incision A sterile bandage was applied  Postoperative plan Weightbearing as tolerated Staples if present can be removed postop day 12-14 Anticoagulation for 28 days Follow-up visit at 4 weeks for x-rays and then x-rays at 6 weeks and 12 weeks [id number]  SURGEON:  Surgeon(s) and Role:    Vickki Hearing, MD - Primary

## 2022-12-21 NOTE — Interval H&P Note (Signed)
History and Physical Interval Note:  12/21/2022 10:15 AM  Debra Henderson  has presented today for surgery, with the diagnosis of left hip fracture.  The various methods of treatment have been discussed with the patient and family. After consideration of risks, benefits and other options for treatment, the patient has consented to  Procedure(s): INTRAMEDULLARY (IM) NAIL INTERTROCHANTERIC (Left) as a surgical intervention.  The patient's history has been reviewed, patient examined, no change in status, stable for surgery.  I have reviewed the patient's chart and labs.  Questions were answered to the patient's satisfaction.     Fuller Canada

## 2022-12-21 NOTE — Progress Notes (Signed)
Patient alert and oriented x4. Unable to lift L leg, can wiggle toes. Full sensation, no numbness or tingling but experiencing pain in that L leg. Patient received IV dilaudid twice this shift. Rated pain 10/10. CHG given. SCDs wore throughout the night, removed this morning to give patient a break.

## 2022-12-21 NOTE — Plan of Care (Signed)

## 2022-12-21 NOTE — Progress Notes (Signed)
UA collected, sent down to lab.

## 2022-12-21 NOTE — Brief Op Note (Signed)
12/21/2022 Open treatment internal fixation of the hip with CEPHALOMEDULLARY nail  Orthopaedic Surgery Operative Note (CSN: [id number])  SAKIYA HULM  1945/06/11 Date of Surgery: 12/21/2022   Diagnoses:  left hip fracture/intertrochanteric fracture  Procedure: Open treatment internal fixation left hip with IM nail   TXA yes   Operative Finding 2 part fracture with greater Kanter avulsion lesser trochanter intact Slight external rotation no displacement  Post-Op Diagnosis: Same Surgeons:Primary: Vickki Hearing, MD Assistants: None Location: [facility] OR ROOM 4 Anesthesia: General Antibiotics: Ancef 2 g Tourniquet time: n/a Estimated Blood Loss: 150cc Complications: Failure of distal locking mechanism, apparent screw left in to prevent stress riser Specimens: None   Implants: Implant Name Type Inv. Item Serial No. Manufacturer Lot No. LRB No. Used Action  NAIL [facility] GAMMA 11X18 - [id number] Nail NAIL TROCH GAMMA 11X18  [employer] [id number] Left 1 Implanted  SCREW LAG GAMMA 3 - [id number] Screw SCREW LAG GAMMA 3  [employer] [id number] Left 1 Implanted  SCREW LOCKING T2 F/T 5MMX35MM - [id number] Screw SCREW LOCKING T2 F/T 5MMX35MM  [employer] [id number] Left 1 Implanted  SCREW LOCKING T2 F/T 5MMX35MM - [id number] Screw SCREW LOCKING T2 F/T 5MMX35MM  [employer] [id number] Left 1 Implanted    BLOOD ADMINISTERED:none  DRAINS: none   LOCAL MEDICATIONS USED: Marcaine with epinephrine   SPECIMEN:  No Specimen  DISPOSITION OF SPECIMEN:  N/A  COUNTS:  CORRECT   DICTATION: .Dragon Dictation   Surgeon Romeo Apple  The patient was taken to the recovery room in stable condition  PLAN OF CARE: Discharge to home after PACU  PATIENT DISPOSITION:  PACU - hemodynamically stable.   Delay start of Pharmacological VTE agent (>24hrs) due to surgical blood loss or risk of bleeding: no

## 2022-12-21 NOTE — Progress Notes (Signed)
PROGRESS NOTE  Debra Henderson, is a 77 y.o. female, DOB - October 11, 1945, ZOX:[id number]  Admit date - 12/20/2022   Admitting Physician Frankey Shown, DO  Outpatient Primary MD for the patient is [name], MD  LOS - 1  Chief Complaint  Patient presents with   Fall       Brief Narrative:  77 y.o. female with medical history significant of hypertension, hyperlipidemia, PAD admitted on 12/20/2022 with left hip fracture after mechanical fall at home    -Assessment and Plan: 1)LT Hip Fx--- s/p mechanical fall at home while walking down the steps -Orthopedic consult appreciated -On 12/21/2022 after allowing for Plavix washout pt underwent ORIF with IM nail  -As needed opiates for pain control Postoperative plan Weightbearing as tolerated Staples if present can be removed postop day 12-14 Anticoagulation for 28 days--aspirin 81 twice daily for 1 month (also continue PTA Plavix indefinitely) Follow-up visit at 4 weeks for x-rays and then x-rays at 6 weeks and 12 weeks -Further management by orthopedic team  2)HTN-hold AVapro -Continue with IV hydralazine as needed elevated BP  3)Tobacco abuse--smoking cessation advised Smoking cessation counseling for 4 minutes today,  I have discussed tobacco cessation with the patient.  I have counseled the patient regarding the negative impacts of continued tobacco use including but not limited to lung cancer, COPD, and cardiovascular disease.  I have discussed alternatives to tobacco and modalities that may help facilitate tobacco cessation including but not limited to biofeedback, hypnosis, and medications.  Total time spent with tobacco counseling was 4 minutes. -Give nicotine patch  4) acute hypoxic respiratory failure--suspect underlying COPD (patient is a smoker) -Currently requiring 2 L of oxygen via nasal cannula --Started Incruse Ellipta -As needed albuterol nebs -Incentive spirometry as ordered -Will try to wean off O2  Status  is: Inpatient   Disposition: The patient is from: Home              Anticipated d/c is to: SNF              Anticipated d/c date is: 1 day              Patient currently is not medically stable to d/c. Barriers: Not Clinically Stable-   Code Status :  -  Code Status: Full Code   Family Communication:    NA (patient is alert, awake and coherent)   DVT Prophylaxis  :   - SCDs  SCDs Start: 12/20/22 0607  Lab Results  Component Value Date   PLT 269 12/21/2022   Inpatient Medications  Scheduled Meds:  chlorhexidine  60 mL Topical Once   [MAR Hold] methocarbamol  500 mg Oral TID   [MAR Hold] mupirocin ointment  1 Application Nasal BID   [MAR Hold] nicotine  14 mg Transdermal Daily   povidone-iodine  2 Application Topical Once   [MAR Hold] umeclidinium bromide  1 puff Inhalation Daily   Continuous Infusions:  sodium chloride     ceFAZolin      ceFAZolin (ANCEF) IV     tranexamic acid     PRN Meds:.[MAR Hold] acetaminophen **OR** [MAR Hold] acetaminophen, [MAR Hold] albuterol, bupivacaine-EPINEPHrine, ceFAZolin, [MAR Hold] hydrALAZINE, HYDROmorphone (DILAUDID) injection, [MAR Hold]  HYDROmorphone (DILAUDID) injection, [MAR Hold] ondansetron **OR** [MAR Hold] ondansetron (ZOFRAN) IV, [MAR Hold] oxyCODONE, oxyCODONE **OR** oxyCODONE, sodium chloride irrigation   Anti-infectives (From admission, onward)    Start     Dose/Rate Route Frequency Ordered Stop   12/21/22 1100  ceFAZolin (ANCEF) IVPB 2g/100  mL premix        2 g 200 mL/hr over 30 Minutes Intravenous On call to O.R. 12/21/22 1048 12/22/22 0559   12/21/22 1028  ceFAZolin (ANCEF) 2-4 GM/100ML-% IVPB       Note to Pharmacy: Daphane Shepherd H: cabinet override      12/21/22 1028 12/21/22 2244      Subjective: Debra Henderson today has no fevers, no emesis,  No chest pain,   - 2 daughters and 1 son in law At bedside, questions answered   Objective: Vitals:   12/21/22 0659 12/21/22 0801 12/21/22 1215 12/21/22 1230  BP:  119/62  (!) 94/46 (!) 104/47  Pulse: (!) 101  98 98  Resp:   13 14  Temp:      TempSrc:      SpO2:  93%  100%  Weight:      Height:        Intake/Output Summary (Last 24 hours) at 12/21/2022 1237 Last data filed at 12/21/2022 1157 Gross per 24 hour  Intake 2406.8 ml  Output 650 ml  Net 1756.8 ml   Filed Weights   12/20/22 0136 12/20/22 0624  Weight: 47.2 kg 47.3 kg   Physical Exam  Gen:- Awake Alert,  in no apparent distress  HEENT:- [place].AT, No sclera icterus Nose- [place] 2L/min Neck-Supple Neck,No JVD,.  Lungs-No wheezing, fair symmetrical air movement CV- S1, S2 normal, regular , 2/6 SM Abd-  +ve B.Sounds, Abd Soft, No tenderness,    Extremity/Skin:- No  edema, pedal pulses present  Psych-affect is appropriate, oriented x3 Neuro-no new focal deficits, no tremors MSK--left hip post op wound is clean dry and intact  Data Reviewed: I have personally reviewed following labs and imaging studies  CBC: Recent Labs  Lab 12/20/22 0317 12/21/22 0420  WBC 5.7 8.9  NEUTROABS 3.7  --   HGB 10.2* 9.2*  HCT 30.1* 27.6*  MCV 96.2 97.9  PLT 350 269   Basic Metabolic Panel: Recent Labs  Lab 12/20/22 0317 12/20/22 0650 12/20/22 1218 12/20/22 1802 12/21/22 0420  NA 129*  --  130* 130* 127*  K 3.4*  --  4.2 3.8 3.7  CL 93*  --  99 101 97*  CO2 23  --  25 21* 22  GLUCOSE 111*  --  98 130* 107*  BUN 23  --  14 13 11   CREATININE 0.78  --  0.63 0.59 0.55  CALCIUM 9.3  --  8.3* 8.3* 8.4*  MG  --  2.1  --   --   --   PHOS  --  3.3  --   --   --    GFR: Estimated Creatinine Clearance: 43.6 mL/min (by C-G formula based on SCr of 0.55 mg/dL). Liver Function Tests: Recent Labs  Lab 12/20/22 0317 12/21/22 0420  AST 16 15  ALT 13 9  ALKPHOS 49 43  BILITOT 0.3 0.6  PROT 6.9 5.8*  ALBUMIN 3.7 2.9*   Radiology Studies: DG C-Arm 1-60 Min-No Report  Result Date: 12/21/2022 Fluoroscopy was utilized by the requesting physician.  No radiographic interpretation.   DG Hip  Unilat W or Wo Pelvis 2-3 Views Left  Result Date: 12/20/2022 CLINICAL DATA:  Status post trauma with subsequent left hip pain. EXAM: DG HIP (WITH OR WITHOUT PELVIS) 2-3V LEFT COMPARISON:  None Available. FINDINGS: There is an acute, mildly displaced fracture deformity seen extending through the inter trochanteric region of the proximal left femur. There is no evidence of dislocation. A radiopaque  vascular stent is noted overlying the superomedial aspect of the pelvis on the left. IMPRESSION: Acute fracture of the proximal left femur. Electronically Signed   By: Aram Candela M.D.   On: 12/20/2022 02:30    Scheduled Meds:  chlorhexidine  60 mL Topical Once   [MAR Hold] methocarbamol  500 mg Oral TID   [MAR Hold] mupirocin ointment  1 Application Nasal BID   [MAR Hold] nicotine  14 mg Transdermal Daily   povidone-iodine  2 Application Topical Once   [MAR Hold] umeclidinium bromide  1 puff Inhalation Daily   Continuous Infusions:  sodium chloride     ceFAZolin      ceFAZolin (ANCEF) IV     tranexamic acid      LOS: 1 day   Shon Hale M.D on 12/21/2022 at 12:37 PM  Go to [email or web address] - for contact info  [facility] - Office  [phone]  If 7PM-7AM, please contact night-coverage [email or web address] 12/21/2022, 12:37 PM

## 2022-12-21 NOTE — ED Provider Notes (Signed)
[facility] MEDICAL SURGICAL UNIT Provider Note   CSN: [id number] Arrival date & time: 12/20/22  0129     History {Add pertinent medical, surgical, social history, OB history to HPI:1} Chief Complaint  Patient presents with   Debra Henderson is a 77 y.o. female.  Mechanical fall with left hip pain. No other injuries. On plavix for PAD, no anticoagulants. No syncope.    Fall       Home Medications Prior to Admission medications   Medication Sig Start Date End Date Taking? Authorizing Provider  cholecalciferol (VITAMIN D3) 25 MCG (1000 UNIT) tablet Take 1,000 Units by mouth daily.   Yes [provider]  clopidogrel (PLAVIX) 75 MG tablet TAKE1 TABLET EVERY DAY 01/01/17  Yes [provider]  Cyanocobalamin (B-12) 1000 MCG SUBL Place 1,000 mcg under the tongue daily. Start 09/15/22   Yes [provider]  ferrous sulfate 325 (65 FE) MG EC tablet Take 325 mg by mouth 3 (three) times daily with meals.   Yes [provider]  olmesartan-hydrochlorothiazide (BENICAR HCT) 20-12.5 MG tablet Take 1 tablet by mouth daily. 10/07/22  Yes BranchDorothe Pea, MD  omega-3 acid ethyl esters (LOVAZA) 1 G capsule Take 1 g by mouth 3 (three) times daily.   Yes [provider]  pravastatin (PRAVACHOL) 10 MG tablet Take 10 mg by mouth daily.   Yes [provider]  anastrozole (ARIMIDEX) 1 MG tablet Take 1 mg by mouth daily. 10/26/22   [provider]      Allergies    Tetracyclines & related, Vytorin [ezetimibe-simvastatin], Chicken allergy, Egg-derived products, Erythromycin, Lipitor [atorvastatin], Penicillins, and Sulfa antibiotics    Review of Systems   Review of Systems  Physical Exam Updated Vital Signs BP 119/62 (BP Location: Left Arm)   Pulse (!) 101   Temp 98.5 F (36.9 C) (Oral)   Resp 20   Ht 5' (1.524 m)   Wt 47.3 kg   SpO2 92%   BMI 20.37 kg/m  Physical Exam Vitals and nursing note reviewed.   Constitutional:      Appearance: She is well-developed.  HENT:     Head: Normocephalic and atraumatic.  Eyes:     Pupils: Pupils are equal, round, and reactive to light.  Cardiovascular:     Rate and Rhythm: Normal rate and regular rhythm.  Pulmonary:     Effort: No respiratory distress.     Breath sounds: No stridor.  Abdominal:     General: Abdomen is flat. There is no distension.  Musculoskeletal:     Cervical back: Normal range of motion.  Neurological:     General: No focal deficit present.     Mental Status: She is alert.     ED Results / Procedures / Treatments   Labs (all labs ordered are listed, but only abnormal results are displayed) Labs Reviewed  SURGICAL PCR SCREEN - Abnormal; Notable for the following components:      Result Value   Staphylococcus aureus POSITIVE (*)    All other components within normal limits  CBC WITH DIFFERENTIAL/PLATELET - Abnormal; Notable for the following components:   RBC 3.13 (*)    Hemoglobin 10.2 (*)    HCT 30.1 (*)    All other components within normal limits  COMPREHENSIVE METABOLIC PANEL - Abnormal; Notable for the following components:   Sodium 129 (*)    Potassium 3.4 (*)    Chloride 93 (*)    Glucose, Bld 111 (*)  All other components within normal limits  BASIC METABOLIC PANEL - Abnormal; Notable for the following components:   Sodium 130 (*)    Calcium 8.3 (*)    All other components within normal limits  BASIC METABOLIC PANEL - Abnormal; Notable for the following components:   Sodium 130 (*)    CO2 21 (*)    Glucose, Bld 130 (*)    Calcium 8.3 (*)    All other components within normal limits  COMPREHENSIVE METABOLIC PANEL - Abnormal; Notable for the following components:   Sodium 127 (*)    Chloride 97 (*)    Glucose, Bld 107 (*)    Calcium 8.4 (*)    Total Protein 5.8 (*)    Albumin 2.9 (*)    All other components within normal limits  CBC - Abnormal; Notable for the following components:   RBC 2.82  (*)    Hemoglobin 9.2 (*)    HCT 27.6 (*)    All other components within normal limits  SODIUM, URINE, RANDOM  PHOSPHORUS  MAGNESIUM  OSMOLALITY, URINE  OSMOLALITY    EKG EKG Interpretation Date/Time:  Saturday December 20 2022 01:44:33 EST Ventricular Rate:  100 PR Interval:    QRS Duration:  88 QT Interval:  355 QTC Calculation: 458 R Axis:   82  Text Interpretation: Sinus tachycardia Borderline right axis deviation Low voltage, precordial leads Confirmed by Marily Memos [phone]) on 12/20/2022 5:55:17 AM  Radiology DG Hip Unilat W or Wo Pelvis 2-3 Views Left  Result Date: 12/20/2022 CLINICAL DATA:  Status post trauma with subsequent left hip pain. EXAM: DG HIP (WITH OR WITHOUT PELVIS) 2-3V LEFT COMPARISON:  None Available. FINDINGS: There is an acute, mildly displaced fracture deformity seen extending through the inter trochanteric region of the proximal left femur. There is no evidence of dislocation. A radiopaque vascular stent is noted overlying the superomedial aspect of the pelvis on the left. IMPRESSION: Acute fracture of the proximal left femur. Electronically Signed   By: Aram Candela M.D.   On: 12/20/2022 02:30    Procedures Procedures  {Document cardiac monitor, telemetry assessment procedure when appropriate:1}  Medications Ordered in ED Medications  HYDROmorphone (DILAUDID) injection 0.5 mg (0.5 mg Intravenous Given 12/21/22 0700)  acetaminophen (TYLENOL) tablet 650 mg (has no administration in time range)    Or  acetaminophen (TYLENOL) suppository 650 mg (has no administration in time range)  ondansetron (ZOFRAN) tablet 4 mg (has no administration in time range)    Or  ondansetron (ZOFRAN) injection 4 mg (has no administration in time range)  0.9 %  sodium chloride infusion (0 mLs Intravenous Stopping previously hung infusion 12/20/22 1958)  nicotine (NICODERM CQ - dosed in mg/24 hours) patch 14 mg (14 mg Transdermal Patch Applied 12/20/22 1354)   methocarbamol (ROBAXIN) tablet 500 mg (500 mg Oral Given 12/20/22 2224)  oxyCODONE (Oxy IR/ROXICODONE) immediate release tablet 5 mg (has no administration in time range)  hydrALAZINE (APRESOLINE) injection 10 mg (has no administration in time range)  albuterol (PROVENTIL) (2.5 MG/3ML) 0.083% nebulizer solution 2.5 mg (has no administration in time range)  umeclidinium bromide (INCRUSE ELLIPTA) 62.5 MCG/ACT 1 puff (1 puff Inhalation Not Given 12/20/22 1302)  mupirocin ointment (BACTROBAN) 2 % 1 Application (1 Application Nasal Given 12/20/22 2224)  fentaNYL (SUBLIMAZE) injection 50 mcg (50 mcg Intravenous Given 12/20/22 0316)  sodium chloride 0.9 % bolus 1,000 mL (1,000 mLs Intravenous New Bag/Given 12/20/22 0506)  potassium chloride 10 mEq in 100 mL IVPB (10 mEq  Intravenous New Bag/Given 12/20/22 0926)    ED Course/ Medical Decision Making/ A&P   {   Click here for ABCD2, HEART and other calculatorsREFRESH Note before signing :1}                              Medical Decision Making Amount and/or Complexity of Data Reviewed Labs: ordered. Radiology: ordered.  Risk Prescription drug management. Decision regarding hospitalization.   ***  {Document critical care time when appropriate:1} {Document review of labs and clinical decision tools ie heart score, Chads2Vasc2 etc:1}  {Document your independent review of radiology images, and any outside records:1} {Document your discussion with family members, caretakers, and with consultants:1} {Document social determinants of health affecting pt's care:1} {Document your decision making why or why not admission, treatments were needed:1} Final Clinical Impression(s) / ED Diagnoses Final diagnoses:  Closed pertrochanteric fracture of left femur, initial encounter (HCC)    Rx / DC Orders ED Discharge Orders     None

## 2022-12-21 NOTE — Addendum Note (Signed)
Addendum  created 12/21/22 1245 by Ronelle Nigh, MD   Intraprocedure Meds edited

## 2022-12-21 NOTE — Transfer of Care (Signed)
Immediate Anesthesia Transfer of Care Note  Patient: Debra Henderson  Procedure(s) Performed: INTRAMEDULLARY (IM) NAIL INTERTROCHANTERIC (Left: Hip)  Patient Location: PACU  Anesthesia Type:General  Level of Consciousness: awake and sedated  Airway & Oxygen Therapy: Patient connected to face mask oxygen  Post-op Assessment: Post -op Vital signs reviewed and stable  Post vital signs: Reviewed and stable  Last Vitals:  Vitals Value Taken Time  BP 94/46 12/21/22 1215  Temp    Pulse 97 12/21/22 1217  Resp 13 12/21/22 1217  SpO2 100 % 12/21/22 1217  Vitals shown include unfiled device data.  Last Pain:  Vitals:   12/21/22 0937  TempSrc:   PainSc: 4       Patients Stated Pain Goal: 8 (12/20/22 7829)  Complications: No notable events documented.

## 2022-12-21 NOTE — Consult Note (Addendum)
ORTHOPAEDIC CONSULTATION  REQUESTING PHYSICIAN: Shon Hale, MD  ASSESSMENT AND PLAN: 77 y.o. female with the following: left hip intertrochanteric fracture   This patient requires inpatient admission to manage this problem appropriately. Orthopedics recommends admission to a medical service and we will provide consultation and follow along    Chief Complaint: left hip pain   HPI: Debra Henderson is a 77 y.o. 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, ER/PR+   Cancer (HCC) 2014   breast cancer   Chronic kidney disease    renal artery stenosis   Claudication (HCC)    lower extremities   Complication of anesthesia    ' It does not work " I am difficult to put tpo sleep   Family history of anesthesia complication    my brother is difficult to put to sleep also   GERD (gastroesophageal reflux disease)    otc   History of renal stent    LEA DUPLEX, 03/16/2012 - LEFT EIA DISTAL/COMMON FEMORAL ARTERY-demonstrated occlusive disease   Hyperlipidemia    Hypertension    Incontinence    Peripheral vascular disease (HCC)    prior stenting of left iliac artery   Personal history of radiation therapy 2014   right breast   Personal history of radiation therapy 2019   left breast   Radiation 08/02/12-08/23/12   Right breast 42.72 Gy x 16 fx   Renal artery stenosis (HCC)    RENAL DOPPLER, 02/12/2011 - RIGHT RENAL ARTERY 60-99% diameter reduction, LEFT RENAL ARTERY AT STENT 60-99% diameter reduction    Past Surgical History:  Procedure  Laterality Date   ANGIOPLASTY ILLIAC ARTERY  02/24/2012   Dr Allyson Sabal  L iliac restent   ATHERECTOMY N/A 02/24/2012   Procedure: ATHERECTOMY;  Surgeon: Runell Gess, MD;  Location: [facility] CATH LAB;  Service: Cardiovascular;  Laterality: N/A;   BLADDER SUSPENSION  1980's   bladder tack     BREAST BIOPSY Right 05/2013   BREAST BIOPSY Right 02/13/2012   BREAST BIOPSY Left 12/25/2016   BREAST LUMPECTOMY Right 05/02/2012   BREAST LUMPECTOMY Left 02/06/2017   BREAST LUMPECTOMY WITH NEEDLE LOCALIZATION Right 04/22/2012   Procedure: RIGHT BREAST WIRE LOCALIZATION  LUMPECTOMY ;  Surgeon: Robyne Askew, MD;  Location: [facility] OR;  Service: General;  Laterality: Right;   BREAST LUMPECTOMY WITH RADIOACTIVE SEED LOCALIZATION Left 02/06/2017   Procedure: BREAST LUMPECTOMY WITH RADIOACTIVE SEED LOCALIZATION;  Surgeon: Griselda Miner, MD;  Location: [facility];  Service: General;  Laterality: Left;   fibroid breast     left breast-adenoma benign 1980's   ILIAC ARTERY STENT  11/21/19/02   left    NM MYOVIEW LTD  03/21/2010   normal myocardial perfusion study   PERIPHERAL VASCULAR CATHETERIZATION Left 02/24/2012   Common iliac artery, 8x38 iCast Stent, resulting in a reduction from 80% in-stent restenosis to 0% residual   PERIPHERAL VASCULAR CATHETERIZATION Left 08/04/2006   Renal 90% stenosis, 3.5x 13 drug-eluting stent resulting in a  reduction of 90% in-stent restenosis to 0% residual   PERIPHERAL VASCULAR CATHETERIZATION Left 05/22/2005   Renal 80% in-stent stenosis, 5x15 Aviator resulting in a reduction of 80% in-stent restenosis to 0% residual   PERIPHERAL VASCULAR CATHETERIZATION Left 02/22/2002   Renal in-stent restenosis, 5x15 Guidant rapid-exchange balloon resulting in reduction of a 95% in-stent restenosis to less than 20% residual   PERIPHERAL VASCULAR CATHETERIZATION Left 12/10/2000   95% renal stenosis, 6x26mm Genesis Aviator balloon/stent deployed at 10 atmospheres resulting in a reduction  of  95% stenosis to 0% residual; Common iliac artery stenosis, P12x4 mounted on a 7x2 Powerflex balloon resulting in a reduction of 80% to 0% residual   PERIPHERAL VASCULAR CATHETERIZATION Left 07/27/2000   95% renal stenosis, 7mm x 6cm Smart stent resulting in a reduction of 90-95%  to 0% residual   stent /kidney  12/10/00,08/04/06   2002 R renal artery, 2008 L renal   TUBAL LIGATION     1980's    Family History  Problem Relation Age of Onset   Cancer Mother        colon   COPD Mother    Cancer Father        brain   Stroke Sister    Heart attack Sister    Breast cancer Neg Hx    Social History   Tobacco Use   Smoking status: Former    Current packs/day: 0.00    Types: Cigarettes    Quit date: 02/24/1988    Years since quitting: 34.8   Smokeless tobacco: Never   Tobacco comments:    quit 1990  Vaping Use   Vaping status: Never Used  Substance Use Topics   Alcohol use: No   Drug use: No   Allergies  Allergen Reactions   Tetracyclines & Related Nausea And Vomiting and Other (See Comments)    Vomiting blood   Vytorin [Ezetimibe-Simvastatin] Other (See Comments)    Per Dr Erlene Quan note   Chicken Allergy Diarrhea, Rash and Other (See Comments)   Egg-Derived Products Diarrhea, Rash and Other (See Comments)   Erythromycin Rash   Lipitor [Atorvastatin] Rash and Other (See Comments)    Myalgias    Penicillins Rash, Swelling and Other (See Comments)   Sulfa Antibiotics Swelling and Rash    Current Facility-Administered Medications:    acetaminophen (TYLENOL) tablet 650 mg, 650 mg, Oral, Q6H PRN **OR** acetaminophen (TYLENOL) suppository 650 mg, 650 mg, Rectal, Q6H PRN, Adefeso, Oladapo, DO   albuterol (PROVENTIL) (2.5 MG/3ML) 0.083% nebulizer solution 2.5 mg, 2.5 mg, Nebulization, Q2H PRN, Emokpae, Courage, MD   hydrALAZINE (APRESOLINE) injection 10 mg, 10 mg, Intravenous, Q6H PRN, Emokpae, Courage, MD   HYDROmorphone (DILAUDID) injection 0.5 mg, 0.5 mg, Intravenous, Q3H PRN,  Adefeso, Oladapo, DO, 0.5 mg at 12/21/22 0700   methocarbamol (ROBAXIN) tablet 500 mg, 500 mg, Oral, TID, Emokpae, Courage, MD, 500 mg at 12/20/22 2224   mupirocin ointment (BACTROBAN) 2 % 1 Application, 1 Application, Nasal, BID, Emokpae, Courage, MD, 1 Application at 12/20/22 2224   nicotine (NICODERM CQ - dosed in mg/24 hours) patch 14 mg, 14 mg, Transdermal, Daily, Emokpae, Courage, MD, 14 mg at 12/20/22 1354   ondansetron (ZOFRAN) tablet 4 mg, 4 mg, Oral, Q6H PRN **OR** ondansetron (ZOFRAN) injection 4 mg, 4 mg, Intravenous, Q6H PRN, Adefeso, Oladapo, DO   oxyCODONE (Oxy IR/ROXICODONE) immediate release tablet 5 mg, 5 mg, Oral, Q4H PRN, Emokpae, Courage, MD   umeclidinium bromide (INCRUSE ELLIPTA) 62.5 MCG/ACT 1 puff, 1 puff, Inhalation,  Daily, Shon Hale, MD, 1 puff at 12/21/22 0801    Physical Exam(=30) BP 119/62 (BP Location: Left Arm)   Pulse (!) 101   Temp 98.5 F (36.9 C) (Oral)   Resp 20   Ht 5' (1.524 m)   Wt 47.3 kg   SpO2 93%   BMI 20.37 kg/m   Gen. Appearance normal ectomorphic  Peripheral vascular system weak peripheral pulses  Lymph nodes ARE NORMAL  Gait can not ambulate after injury   Left Upper extremity  Inspection revealed no malalignment or asymmetry  Assessment of range of motion: Full range of motion was recorded  Assessment of stability: Elbow wrist and hand and shoulder were stable  Assessment of muscle strength and tone revealed grade 5 muscle strength and normal muscle tone  Skin was normal without rash lesion or ulceration  Right upper extremity  Inspection revealed no malalignment or asymmetry  Assessment of range of motion: Full range of motion was recorded  Assessment of stability: Elbow wrist and hand and shoulder were stable  Assessment of muscle strength and tone revealed grade 5 muscle strength and normal muscle tone  Skin was normal without rash lesion or ulceration  Right Lower extremity  Inspection revealed no malalignment or  asymmetry  Assessment of range of motion: Full range of motion was recorded  Assessment of stability: Ankle, knee and hip were stable  Assessment of muscle strength and tone revealed grade 5 muscle strength and normal muscle tone  Skin was normal without rash lesion or ulceration  Left lower extremity Inspection revealed malalignment and  asymmetry with shortening and external rotation  Assessment of range of motion: Full range of motion was recorded in the ankle  Assessment of stability: Ankle, knee and hip were stable Assessment of muscle strength and tone revealed grade 5 muscle strength and normal muscle tone Skin was normal without rash lesion or ulceration  Coordination was tested by finger-to-nose nose and was normal Deep tendon reflexes were 2+ in the upper extremities and deferred  in the lower extremities Examination of sensation by touch was normal  Mental status  Oriented to time person and place normal  Mood and affect normal without depression anxiety or agitation  Dx:   Data Reviewed     Latest Ref Rng & Units 12/21/2022    4:20 AM 12/20/2022    3:17 AM 12/12/2022    3:37 PM  CBC  WBC 4.0 - 10.5 K/uL 8.9  5.7  5.6   Hemoglobin 12.0 - 15.0 g/dL 9.2  82.9  56.2   Hematocrit 36.0 - 46.0 % 27.6  30.1  31.4   Platelets 150 - 400 K/uL 269  350  367        Latest Ref Rng & Units 12/21/2022    4:20 AM 12/20/2022    6:02 PM 12/20/2022   12:18 PM  BMP  Glucose 70 - 99 mg/dL 130  865  98   BUN 8 - 23 mg/dL 11  13  14    Creatinine 0.44 - 1.00 mg/dL 7.84  6.96  2.95   Sodium 135 - 145 mmol/L 127  130  130   Potassium 3.5 - 5.1 mmol/L 3.7  3.8  4.2   Chloride 98 - 111 mmol/L 97  101  99   CO2 22 - 32 mmol/L 22  21  25    Calcium 8.9 - 10.3 mg/dL 8.4  8.3  8.3      ER RECORD REVIEWED: CONFIRMS HISTORY   I  reviewed the following images and the reports and my independent interpretation is intertrochanteric hip fracture on the left with gr troch fracture and lesser  intact, no shortening but ext rotation   Assessment  Left hip IT frx , hold plavix; delay surgery 24 hrs . Prefer general anesth   Plan   Internal fixation  LEFT HIP with short gamma nail    Vickki Hearing MD

## 2022-12-21 NOTE — Anesthesia Postprocedure Evaluation (Signed)
Anesthesia Post Note  Patient: Debra Henderson  Procedure(s) Performed: INTRAMEDULLARY (IM) NAIL INTERTROCHANTERIC (Left: Hip)  Patient location during evaluation: PACU Anesthesia Type: General Level of consciousness: awake and alert Pain management: pain level controlled Vital Signs Assessment: post-procedure vital signs reviewed and stable Respiratory status: spontaneous breathing, nonlabored ventilation, respiratory function stable and patient connected to face mask oxygen Cardiovascular status: blood pressure returned to baseline and stable Postop Assessment: no apparent nausea or vomiting Anesthetic complications: no   There were no known notable events for this encounter.   Last Vitals:  Vitals:   12/21/22 0801 12/21/22 1215  BP:  (!) 94/46  Pulse:  98  Resp:  13  Temp:    SpO2: 93%     Last Pain:  Vitals:   12/21/22 1215  TempSrc:   PainSc: Asleep                 Malyia Moro L Sher Shampine

## 2022-12-21 NOTE — H&P (View-Only) (Signed)
ORTHOPAEDIC CONSULTATION  REQUESTING PHYSICIAN: Shon Hale, MD  ASSESSMENT AND PLAN: 77 y.o. female with the following: left hip intertrochanteric fracture   This patient requires inpatient admission to manage this problem appropriately. Orthopedics recommends admission to a medical service and we will provide consultation and follow along    Chief Complaint: left hip pain   HPI: Debra Henderson is a 77 y.o. 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, ER/PR+   Cancer (HCC) 2014   breast cancer   Chronic kidney disease    renal artery stenosis   Claudication (HCC)    lower extremities   Complication of anesthesia    ' It does not work " I am difficult to put tpo sleep   Family history of anesthesia complication    my brother is difficult to put to sleep also   GERD (gastroesophageal reflux disease)    otc   History of renal stent    LEA DUPLEX, 03/16/2012 - LEFT EIA DISTAL/COMMON FEMORAL ARTERY-demonstrated occlusive disease   Hyperlipidemia    Hypertension    Incontinence    Peripheral vascular disease (HCC)    prior stenting of left iliac artery   Personal history of radiation therapy 2014   right breast   Personal history of radiation therapy 2019   left breast   Radiation 08/02/12-08/23/12   Right breast 42.72 Gy x 16 fx   Renal artery stenosis (HCC)    RENAL DOPPLER, 02/12/2011 - RIGHT RENAL ARTERY 60-99% diameter reduction, LEFT RENAL ARTERY AT STENT 60-99% diameter reduction    Past Surgical History:  Procedure  Laterality Date   ANGIOPLASTY ILLIAC ARTERY  02/24/2012   Dr Allyson Sabal  L iliac restent   ATHERECTOMY N/A 02/24/2012   Procedure: ATHERECTOMY;  Surgeon: Runell Gess, MD;  Location: [facility] CATH LAB;  Service: Cardiovascular;  Laterality: N/A;   BLADDER SUSPENSION  1980's   bladder tack     BREAST BIOPSY Right 05/2013   BREAST BIOPSY Right 02/13/2012   BREAST BIOPSY Left 12/25/2016   BREAST LUMPECTOMY Right 05/02/2012   BREAST LUMPECTOMY Left 02/06/2017   BREAST LUMPECTOMY WITH NEEDLE LOCALIZATION Right 04/22/2012   Procedure: RIGHT BREAST WIRE LOCALIZATION  LUMPECTOMY ;  Surgeon: Robyne Askew, MD;  Location: [facility] OR;  Service: General;  Laterality: Right;   BREAST LUMPECTOMY WITH RADIOACTIVE SEED LOCALIZATION Left 02/06/2017   Procedure: BREAST LUMPECTOMY WITH RADIOACTIVE SEED LOCALIZATION;  Surgeon: Griselda Miner, MD;  Location: [facility];  Service: General;  Laterality: Left;   fibroid breast     left breast-adenoma benign 1980's   ILIAC ARTERY STENT  11/21/19/02   left    NM MYOVIEW LTD  03/21/2010   normal myocardial perfusion study   PERIPHERAL VASCULAR CATHETERIZATION Left 02/24/2012   Common iliac artery, 8x38 iCast Stent, resulting in a reduction from 80% in-stent restenosis to 0% residual   PERIPHERAL VASCULAR CATHETERIZATION Left 08/04/2006   Renal 90% stenosis, 3.5x 13 drug-eluting stent resulting in a  reduction of 90% in-stent restenosis to 0% residual   PERIPHERAL VASCULAR CATHETERIZATION Left 05/22/2005   Renal 80% in-stent stenosis, 5x15 Aviator resulting in a reduction of 80% in-stent restenosis to 0% residual   PERIPHERAL VASCULAR CATHETERIZATION Left 02/22/2002   Renal in-stent restenosis, 5x15 Guidant rapid-exchange balloon resulting in reduction of a 95% in-stent restenosis to less than 20% residual   PERIPHERAL VASCULAR CATHETERIZATION Left 12/10/2000   95% renal stenosis, 6x19mm Genesis Aviator balloon/stent deployed at 10 atmospheres resulting in a reduction  of  95% stenosis to 0% residual; Common iliac artery stenosis, P12x4 mounted on a 7x2 Powerflex balloon resulting in a reduction of 80% to 0% residual   PERIPHERAL VASCULAR CATHETERIZATION Left 07/27/2000   95% renal stenosis, 7mm x 6cm Smart stent resulting in a reduction of 90-95%  to 0% residual   stent /kidney  12/10/00,08/04/06   2002 R renal artery, 2008 L renal   TUBAL LIGATION     1980's    Family History  Problem Relation Age of Onset   Cancer Mother        colon   COPD Mother    Cancer Father        brain   Stroke Sister    Heart attack Sister    Breast cancer Neg Hx    Social History   Tobacco Use   Smoking status: Former    Current packs/day: 0.00    Types: Cigarettes    Quit date: 02/24/1988    Years since quitting: 34.8   Smokeless tobacco: Never   Tobacco comments:    quit 1990  Vaping Use   Vaping status: Never Used  Substance Use Topics   Alcohol use: No   Drug use: No   Allergies  Allergen Reactions   Tetracyclines & Related Nausea And Vomiting and Other (See Comments)    Vomiting blood   Vytorin [Ezetimibe-Simvastatin] Other (See Comments)    Per Dr Erlene Quan note   Chicken Allergy Diarrhea, Rash and Other (See Comments)   Egg-Derived Products Diarrhea, Rash and Other (See Comments)   Erythromycin Rash   Lipitor [Atorvastatin] Rash and Other (See Comments)    Myalgias    Penicillins Rash, Swelling and Other (See Comments)   Sulfa Antibiotics Swelling and Rash    Current Facility-Administered Medications:    acetaminophen (TYLENOL) tablet 650 mg, 650 mg, Oral, Q6H PRN **OR** acetaminophen (TYLENOL) suppository 650 mg, 650 mg, Rectal, Q6H PRN, Adefeso, Oladapo, DO   albuterol (PROVENTIL) (2.5 MG/3ML) 0.083% nebulizer solution 2.5 mg, 2.5 mg, Nebulization, Q2H PRN, Emokpae, Courage, MD   hydrALAZINE (APRESOLINE) injection 10 mg, 10 mg, Intravenous, Q6H PRN, Emokpae, Courage, MD   HYDROmorphone (DILAUDID) injection 0.5 mg, 0.5 mg, Intravenous, Q3H PRN,  Adefeso, Oladapo, DO, 0.5 mg at 12/21/22 0700   methocarbamol (ROBAXIN) tablet 500 mg, 500 mg, Oral, TID, Emokpae, Courage, MD, 500 mg at 12/20/22 2224   mupirocin ointment (BACTROBAN) 2 % 1 Application, 1 Application, Nasal, BID, Emokpae, Courage, MD, 1 Application at 12/20/22 2224   nicotine (NICODERM CQ - dosed in mg/24 hours) patch 14 mg, 14 mg, Transdermal, Daily, Emokpae, Courage, MD, 14 mg at 12/20/22 1354   ondansetron (ZOFRAN) tablet 4 mg, 4 mg, Oral, Q6H PRN **OR** ondansetron (ZOFRAN) injection 4 mg, 4 mg, Intravenous, Q6H PRN, Adefeso, Oladapo, DO   oxyCODONE (Oxy IR/ROXICODONE) immediate release tablet 5 mg, 5 mg, Oral, Q4H PRN, Emokpae, Courage, MD   umeclidinium bromide (INCRUSE ELLIPTA) 62.5 MCG/ACT 1 puff, 1 puff, Inhalation,  Daily, Shon Hale, MD, 1 puff at 12/21/22 0801    Physical Exam(=30) BP 119/62 (BP Location: Left Arm)   Pulse (!) 101   Temp 98.5 F (36.9 C) (Oral)   Resp 20   Ht 5' (1.524 m)   Wt 47.3 kg   SpO2 93%   BMI 20.37 kg/m   Gen. Appearance normal ectomorphic  Peripheral vascular system weak peripheral pulses  Lymph nodes ARE NORMAL  Gait can not ambulate after injury   Left Upper extremity  Inspection revealed no malalignment or asymmetry  Assessment of range of motion: Full range of motion was recorded  Assessment of stability: Elbow wrist and hand and shoulder were stable  Assessment of muscle strength and tone revealed grade 5 muscle strength and normal muscle tone  Skin was normal without rash lesion or ulceration  Right upper extremity  Inspection revealed no malalignment or asymmetry  Assessment of range of motion: Full range of motion was recorded  Assessment of stability: Elbow wrist and hand and shoulder were stable  Assessment of muscle strength and tone revealed grade 5 muscle strength and normal muscle tone  Skin was normal without rash lesion or ulceration  Right Lower extremity  Inspection revealed no malalignment or  asymmetry  Assessment of range of motion: Full range of motion was recorded  Assessment of stability: Ankle, knee and hip were stable  Assessment of muscle strength and tone revealed grade 5 muscle strength and normal muscle tone  Skin was normal without rash lesion or ulceration  Left lower extremity Inspection revealed malalignment and  asymmetry with shortening and external rotation  Assessment of range of motion: Full range of motion was recorded in the ankle  Assessment of stability: Ankle, knee and hip were stable Assessment of muscle strength and tone revealed grade 5 muscle strength and normal muscle tone Skin was normal without rash lesion or ulceration  Coordination was tested by finger-to-nose nose and was normal Deep tendon reflexes were 2+ in the upper extremities and deferred  in the lower extremities Examination of sensation by touch was normal  Mental status  Oriented to time person and place normal  Mood and affect normal without depression anxiety or agitation  Dx:   Data Reviewed     Latest Ref Rng & Units 12/21/2022    4:20 AM 12/20/2022    3:17 AM 12/12/2022    3:37 PM  CBC  WBC 4.0 - 10.5 K/uL 8.9  5.7  5.6   Hemoglobin 12.0 - 15.0 g/dL 9.2  08.6  57.8   Hematocrit 36.0 - 46.0 % 27.6  30.1  31.4   Platelets 150 - 400 K/uL 269  350  367        Latest Ref Rng & Units 12/21/2022    4:20 AM 12/20/2022    6:02 PM 12/20/2022   12:18 PM  BMP  Glucose 70 - 99 mg/dL 469  629  98   BUN 8 - 23 mg/dL 11  13  14    Creatinine 0.44 - 1.00 mg/dL 5.28  4.13  2.44   Sodium 135 - 145 mmol/L 127  130  130   Potassium 3.5 - 5.1 mmol/L 3.7  3.8  4.2   Chloride 98 - 111 mmol/L 97  101  99   CO2 22 - 32 mmol/L 22  21  25    Calcium 8.9 - 10.3 mg/dL 8.4  8.3  8.3      ER RECORD REVIEWED: CONFIRMS HISTORY   I  reviewed the following images and the reports and my independent interpretation is intertrochanteric hip fracture on the left with gr troch fracture and lesser  intact, no shortening but ext rotation   Assessment  Left hip IT frx , hold plavix; delay surgery 24 hrs . Prefer general anesth   Plan   Internal fixation with short gamma nail    Vickki Hearing MD

## 2022-12-22 ENCOUNTER — Other Ambulatory Visit: Payer: Self-pay

## 2022-12-22 ENCOUNTER — Encounter (HOSPITAL_COMMUNITY): Payer: Self-pay | Admitting: Orthopedic Surgery

## 2022-12-22 DIAGNOSIS — S72002A Fracture of unspecified part of neck of left femur, initial encounter for closed fracture: Secondary | ICD-10-CM | POA: Diagnosis not present

## 2022-12-22 DIAGNOSIS — I10 Essential (primary) hypertension: Secondary | ICD-10-CM | POA: Diagnosis not present

## 2022-12-22 DIAGNOSIS — I739 Peripheral vascular disease, unspecified: Secondary | ICD-10-CM | POA: Diagnosis not present

## 2022-12-22 LAB — BASIC METABOLIC PANEL
Anion gap: 9 (ref 5–15)
BUN: 14 mg/dL (ref 8–23)
CO2: 21 mmol/L — ABNORMAL LOW (ref 22–32)
Calcium: 8.4 mg/dL — ABNORMAL LOW (ref 8.9–10.3)
Chloride: 99 mmol/L (ref 98–111)
Creatinine, Ser: 0.66 mg/dL (ref 0.44–1.00)
GFR, Estimated: >60 mL/min (ref >60)
Glucose, Bld: 148 mg/dL — ABNORMAL HIGH (ref 70–99)
Potassium: 4 mmol/L (ref 3.5–5.1)
Sodium: 129 mmol/L — ABNORMAL LOW (ref 135–145)

## 2022-12-22 LAB — CBC
HCT: 24.7 % — ABNORMAL LOW (ref 36.0–46.0)
Hemoglobin: 8.2 g/dL — ABNORMAL LOW (ref 12.0–15.0)
MCH: 32.9 pg (ref 26.0–34.0)
MCHC: 33.2 g/dL (ref 30.0–36.0)
MCV: 99.2 fL (ref 80.0–100.0)
Platelets: 239 10*3/uL (ref 150–400)
RBC: 2.49 MIL/uL — ABNORMAL LOW (ref 3.87–5.11)
RDW: 13.2 % (ref 11.5–15.5)
WBC: 12.2 10*3/uL — ABNORMAL HIGH (ref 4.0–10.5)
nRBC: 0 % (ref 0.0–0.2)

## 2022-12-22 LAB — GLUCOSE, CAPILLARY: Glucose-Capillary: 157 mg/dL — ABNORMAL HIGH (ref 70–99)

## 2022-12-22 MED ORDER — RIVAROXABAN 10 MG PO TABS
10.0000 mg | ORAL_TABLET | Freq: Every day | ORAL | Status: DC
  Administered 2022-12-22 – 2022-12-24 (×3): 10 mg via ORAL
  Filled 2022-12-22 (×3): qty 1

## 2022-12-22 MED ORDER — PRAVASTATIN SODIUM 40 MG PO TABS
40.0000 mg | ORAL_TABLET | Freq: Every day | ORAL | Status: DC
  Administered 2022-12-22 – 2022-12-24 (×3): 40 mg via ORAL
  Filled 2022-12-22 (×3): qty 1

## 2022-12-22 MED ORDER — ASPIRIN 81 MG PO TBEC: 81.0000 mg | DELAYED_RELEASE_TABLET | Freq: Every day | ORAL | Status: DC

## 2022-12-22 MED ORDER — CLOPIDOGREL BISULFATE 75 MG PO TABS
75.0000 mg | ORAL_TABLET | Freq: Every day | ORAL | Status: DC
  Administered 2022-12-23 – 2022-12-25 (×3): 75 mg via ORAL
  Filled 2022-12-22 (×3): qty 1

## 2022-12-22 NOTE — [place] FL2 (Signed)
MEDICAID FL2 LEVEL OF CARE FORM     IDENTIFICATION  Patient Name: Debra Henderson Birthdate: July 07, 1945 Sex: female Admission Date (Current Location): 12/20/2022  [facility] and [place] Number:  [employer] and Address:  [facility],  [address] [place] [address]      Provider Number: [id number]  Attending Physician Name and Address:  Shon Hale, MD  Relative Name and Phone Number:       Current Level of Care: Hospital Recommended Level of Care: Skilled Nursing Facility Prior Approval Number:    Date Approved/Denied:   PASRR Number: [id number]  Discharge Plan: SNF    Current Diagnoses: Patient Active Problem List   Diagnosis Date Noted   Closed left femoral fracture (HCC) 12/20/2022   Hyponatremia 12/20/2022   Hypokalemia 12/20/2022   Tobacco abuse 12/20/2022   B12 deficiency 09/21/2021   Osteoporosis 09/14/2017   Essential hypertension 02/04/2017   Mixed hyperlipidemia 02/04/2017   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  Left lower extremity. 02/25/2012    Orientation RESPIRATION BLADDER Height & Weight     Self, Time, Situation, Place  O2 Continent Weight: 104 lb 4.4 oz (47.3 kg) Height:  5' (152.4 cm)  BEHAVIORAL SYMPTOMS/MOOD NEUROLOGICAL BOWEL NUTRITION STATUS      Continent Diet (Heart healthy)  AMBULATORY STATUS COMMUNICATION OF NEEDS Skin   Extensive Assist Verbally Normal                       Personal Care Assistance Level of Assistance  Bathing, Feeding, Dressing Bathing Assistance: Limited assistance Feeding assistance: Independent Dressing Assistance: Limited assistance     Functional Limitations Info  Sight, Hearing, Speech Sight Info: Adequate Hearing Info:  Adequate Speech Info: Adequate    SPECIAL CARE FACTORS FREQUENCY  PT (By licensed PT), OT (By licensed OT)     PT Frequency: 5 times weekly OT Frequency: 5 times weekly            Contractures Contractures Info: Not present    Additional Factors Info  Code Status, Allergies Code Status Info: FULL Allergies Info: Tetracyclines & Related, Vytorin (Ezetimibe-simvastatin), Chicken Allergy, Egg-derived Products, Erythromycin, Lipitor (Atorvastatin), Penicillins, Sulfa Antibiotics           Current Medications (12/22/2022):  This is the current hospital active medication list Current Facility-Administered Medications  Medication Dose Route Frequency Provider Last Rate Last Admin   acetaminophen (TYLENOL) tablet 650 mg  650 mg Oral Q6H PRN Adefeso, Oladapo, DO       Or   acetaminophen (TYLENOL) suppository 650 mg  650 mg Rectal Q6H PRN Adefeso, Oladapo, DO       albuterol (PROVENTIL) (2.5 MG/3ML) 0.083% nebulizer solution 2.5 mg  2.5 mg Nebulization Q2H PRN Emokpae, Courage, MD       hydrALAZINE (APRESOLINE) injection 10 mg  10 mg Intravenous Q6H PRN Emokpae, Courage, MD       HYDROmorphone (DILAUDID) injection 0.25-0.5 mg  0.25-0.5 mg Intravenous Q5 min PRN Ronelle Nigh, MD       HYDROmorphone (DILAUDID) injection 0.5 mg  0.5 mg Intravenous Q3H PRN Adefeso, Oladapo, DO   0.5 mg at 12/22/22 0956   methocarbamol (ROBAXIN) tablet 500 mg  500 mg Oral TID Shon Hale, MD   500 mg at  12/22/22 0839   mupirocin ointment (BACTROBAN) 2 % 1 Application  1 Application Nasal BID Shon Hale, MD   1 Application at 12/22/22 0839   nicotine (NICODERM CQ - dosed in mg/24 hours) patch 14 mg  14 mg Transdermal Daily Emokpae, Courage, MD   14 mg at 12/22/22 0840   ondansetron (ZOFRAN) tablet 4 mg  4 mg Oral Q6H PRN Adefeso, Oladapo, DO       Or   ondansetron (ZOFRAN) injection 4 mg  4 mg Intravenous Q6H PRN Adefeso, Oladapo, DO   4 mg at 12/21/22 1140   oxyCODONE (Oxy IR/ROXICODONE)  immediate release tablet 5 mg  5 mg Oral Q4H PRN Emokpae, Courage, MD       oxyCODONE (Oxy IR/ROXICODONE) immediate release tablet 5 mg  5 mg Oral Once PRN Ronelle Nigh, MD       Or   oxyCODONE (ROXICODONE) 5 MG/5ML solution 5 mg  5 mg Oral Once PRN Ronelle Nigh, MD       umeclidinium bromide (INCRUSE ELLIPTA) 62.5 MCG/ACT 1 puff  1 puff Inhalation Daily Shon Hale, MD   1 puff at 12/22/22 0801     Discharge Medications: Please see discharge summary for a list of discharge medications.  Relevant Imaging Results:  Relevant Lab Results:   Additional Information SSN: [id number], [place]

## 2022-12-22 NOTE — Progress Notes (Addendum)
PROGRESS NOTE  Debra Henderson, is a 77 y.o. female, DOB - 1945/08/30, ZOX:[id number]  Admit date - 12/20/2022   Admitting Physician Frankey Shown, DO  Outpatient Primary MD for the patient is [name], MD  LOS - 2  Chief Complaint  Patient presents with   Fall       Brief Narrative:  77 y.o. female with medical history significant of hypertension, hyperlipidemia, PAD admitted on 12/20/2022 with left hip fracture after mechanical fall at home - PT eval appreciated ,  -patient is medically ready/medically stable for transfer to  SNF rehab when SNF bed can be obtained and insurance authorization is available   -Assessment and Plan: 1)LT Hip Fx--- s/p mechanical fall at home while walking down the steps -Orthopedic consult appreciated -On 12/21/2022 after allowing for Plavix washout pt underwent ORIF with IM nail  -As needed opiates for pain control Postoperative plan Weightbearing as tolerated Staples if present can be removed postop day 12-14 Anticoagulation for 28 days--give Xarelto 10 mg for DVT prophylaxis while in the hospital, anticipate discharge on aspirin 81 daily for 1 month (also continue PTA Plavix indefinitely)--- anticipate she will be more active and mobile at SNF facility Follow-up visit at 4 weeks for x-rays and then x-rays at 6 weeks and 12 weeks -Further management by orthopedic team  2)HTN-hold AVapro -Continue with IV hydralazine as needed elevated BP  3) COPD --no further tobacco use -No exacerbation -Incruse Ellipta as ordered -As needed albuterol neb  4) acute hypoxic respiratory failure--suspect underlying COPD (patient is a smoker) -Currently requiring 2 L of oxygen via nasal cannula --Started Incruse Ellipta -As needed albuterol nebs -Incentive spirometry as ordered -Anticipate she will be weaned off O2 prior to discharge to SNF rehab  5)PAD--restart Plavix and pravastatin on 12/23/2022  Status is: Inpatient   Disposition: The  patient is from: Home              Anticipated d/c is to: SNF              Anticipated d/c date is: 1 day              Patient currently is medically stable to d/c. Barriers: -patient is medically ready/medically stable for transfer to  SNF rehab when SNF bed can be obtained and insurance authorization is available  Code Status :  -  Code Status: Full Code   Family Communication:    NA (patient is alert, awake and coherent)   DVT Prophylaxis  :   - SCDs  SCDs Start: 12/20/22 0607  Lab Results  Component Value Date   PLT 239 12/22/2022   Inpatient Medications  Scheduled Meds:  aspirin EC  81 mg Oral Q breakfast   [START ON 12/23/2022] clopidogrel  75 mg Oral Daily   methocarbamol  500 mg Oral TID   mupirocin ointment  1 Application Nasal BID   nicotine  14 mg Transdermal Daily   umeclidinium bromide  1 puff Inhalation Daily   Continuous Infusions:   PRN Meds:.acetaminophen **OR** acetaminophen, albuterol, hydrALAZINE, HYDROmorphone (DILAUDID) injection, ondansetron **OR** ondansetron (ZOFRAN) IV, oxyCODONE   Anti-infectives (From admission, onward)    Start     Dose/Rate Route Frequency Ordered Stop   12/21/22 1100  ceFAZolin (ANCEF) IVPB 2g/100 mL premix  Status:  Discontinued        2 g 200 mL/hr over 30 Minutes Intravenous On call to O.R. 12/21/22 1048 12/21/22 1449   12/21/22 1028  ceFAZolin (ANCEF) 2-4 GM/100ML-% IVPB  Note to Pharmacy: Daphane Shepherd H: cabinet override      12/21/22 1028 12/21/22 2244      Subjective: Debra Henderson today has no fevers, no emesis,  No chest pain,   - Ambulated with therapy -patient is medically ready/medically stable for transfer to  SNF rehab when SNF bed can be obtained and insurance authorization is available  Objective: Vitals:   12/22/22 0421 12/22/22 0622 12/22/22 0801 12/22/22 1426  BP: (!) 140/77 136/78  119/67  Pulse: 98 98  100  Resp: 16   20  Temp: 97.6 F (36.4 C)   98.1 F (36.7 C)  TempSrc: Oral   Oral   SpO2: 97% 96% 97% 90%  Weight:      Height:        Intake/Output Summary (Last 24 hours) at 12/22/2022 1613 Last data filed at 12/22/2022 1306 Gross per 24 hour  Intake 600 ml  Output 300 ml  Net 300 ml   Filed Weights   12/20/22 0136 12/20/22 0624  Weight: 47.2 kg 47.3 kg   Physical Exam  Gen:- Awake Alert,  in no apparent distress  HEENT:- [place].AT, No sclera icterus Nose- [place] 2L/min Neck-Supple Neck,No JVD,.  Lungs-No wheezing, fair symmetrical air movement CV- S1, S2 normal, regular , 2/6 SM Abd-  +ve B.Sounds, Abd Soft, No tenderness,    Extremity/Skin:- No  edema, pedal pulses present  Psych-affect is appropriate, oriented x3 Neuro-no new focal deficits, no tremors MSK--left hip post op wound is clean dry and intact  Data Reviewed: I have personally reviewed following labs and imaging studies  CBC: Recent Labs  Lab 12/20/22 0317 12/21/22 0420 12/22/22 0942  WBC 5.7 8.9 12.2*  NEUTROABS 3.7  --   --   HGB 10.2* 9.2* 8.2*  HCT 30.1* 27.6* 24.7*  MCV 96.2 97.9 99.2  PLT 350 269 239   Basic Metabolic Panel: Recent Labs  Lab 12/20/22 0317 12/20/22 0650 12/20/22 1218 12/20/22 1802 12/21/22 0420 12/22/22 0942  NA 129*  --  130* 130* 127* 129*  K 3.4*  --  4.2 3.8 3.7 4.0  CL 93*  --  99 101 97* 99  CO2 23  --  25 21* 22 21*  GLUCOSE 111*  --  98 130* 107* 148*  BUN 23  --  14 13 11 14   CREATININE 0.78  --  0.63 0.59 0.55 0.66  CALCIUM 9.3  --  8.3* 8.3* 8.4* 8.4*  MG  --  2.1  --   --   --   --   PHOS  --  3.3  --   --   --   --    GFR: Estimated Creatinine Clearance: 43.6 mL/min (by C-G formula based on SCr of 0.66 mg/dL). Liver Function Tests: Recent Labs  Lab 12/20/22 0317 12/21/22 0420  AST 16 15  ALT 13 9  ALKPHOS 49 43  BILITOT 0.3 0.6  PROT 6.9 5.8*  ALBUMIN 3.7 2.9*   Radiology Studies: DG HIP UNILAT WITH PELVIS 2-3 VIEWS LEFT  Result Date: 12/21/2022 CLINICAL DATA:  Postop. EXAM: DG HIP (WITH OR WITHOUT PELVIS) 2-3V LEFT  COMPARISON:  Preoperative imaging. FINDINGS: Tibial intramedullary nail with trans trochanteric and distal locking screw fixation traverse intertrochanteric femur fracture. No new fracture. Recent postsurgical change includes air and edema in the soft tissues. IMPRESSION: ORIF of intertrochanteric femur fracture without immediate postoperative complication. Electronically Signed   By: Narda Rutherford M.D.   On: 12/21/2022 14:09   DG HIP  UNILAT WITH PELVIS 2-3 VIEWS LEFT  Result Date: 12/21/2022 CLINICAL DATA:  Left hip fracture. EXAM: DG HIP (WITH OR WITHOUT PELVIS) 2-3V LEFT COMPARISON:  Preoperative imaging FINDINGS: Eight fluoroscopic spot views of the left hip obtained in the operating room. Femoral intramedullary nail with trans trochanteric and distal locking screw fixation traverse proximal femur fracture. Fluoroscopy time 5 minutes 51 seconds, dose 39.21 mGy. IMPRESSION: Intraoperative fluoroscopy during left hip fracture fixation. Electronically Signed   By: Narda Rutherford M.D.   On: 12/21/2022 14:08   DG C-Arm 1-60 Min-No Report  Result Date: 12/21/2022 Fluoroscopy was utilized by the requesting physician.  No radiographic interpretation.    Scheduled Meds:  aspirin EC  81 mg Oral Q breakfast   [START ON 12/23/2022] clopidogrel  75 mg Oral Daily   methocarbamol  500 mg Oral TID   mupirocin ointment  1 Application Nasal BID   nicotine  14 mg Transdermal Daily   umeclidinium bromide  1 puff Inhalation Daily   Continuous Infusions:   LOS: 2 days   Shon Hale M.D on 12/22/2022 at 4:13 PM  Go to [email or web address] - for contact info  [facility] - Office  [phone]  If 7PM-7AM, please contact night-coverage [email or web address] 12/22/2022, 4:13 PM

## 2022-12-22 NOTE — Evaluation (Signed)
Physical Therapy Evaluation Patient Details Name: Debra Henderson MRN: [id number] DOB: 11/01/1945 Today's Date: 12/22/2022  History of Present Illness  Debra Henderson is a 77 y.o. female s/p Open treatment internal fixation left hip with IM nail on 12/21/22, with medical history significant of hypertension, hyperlipidemia, PAD who presents to the emergency department from home via EMS due to mechanical fall sustained while walking down the steps.  Patient was on Plavix due to PAD.  She denies hitting her head or loss of consciousness, but she complains of left hip pain.  She had a c-collar on arrival to the ED.   Clinical Impression  Patient demonstrates slow labored movement for sitting up at bedside with difficulty moving LLE due to increasing hip pain, able to complete a few side steps with difficulty weightbearing on LLE and tolerated sitting up in chair after therapy - nursing staff aware.  Patient will benefit from continued skilled physical therapy in hospital and recommended venue below to increase strength, balance, endurance for safe ADLs and gait.          If plan is discharge home, recommend the following: A lot of help with bathing/dressing/bathroom;A lot of help with walking and/or transfers;Help with stairs or ramp for entrance;Assistance with cooking/housework   Can travel by private vehicle   No    Equipment Recommendations Rolling walker (2 wheels)  Recommendations for Other Services       Functional Status Assessment Patient has had a recent decline in their functional status and demonstrates the ability to make significant improvements in function in a reasonable and predictable amount of time.     Precautions / Restrictions Precautions Precautions: Fall Restrictions Weight Bearing Restrictions: No      Mobility  Bed Mobility Overal bed mobility: Needs Assistance Bed Mobility: Supine to Sit     Supine to sit: Mod assist     General bed mobility  comments: increased time, labored movement, increased left hip pain    Transfers Overall transfer level: Needs assistance Equipment used: Rolling walker (2 wheels) Transfers: Sit to/from Stand, Bed to chair/wheelchair/BSC Sit to Stand: Mod assist   Step pivot transfers: Mod assist       General transfer comment: slow labored movement with difficulty advancing LLE due to hip pain    Ambulation/Gait Ambulation/Gait assistance: Mod assist Gait Distance (Feet): 5 Feet Assistive device: Rolling walker (2 wheels) Gait Pattern/deviations: Decreased step length - left, Decreased step length - right, Decreased stride length, Antalgic, Decreased stance time - left Gait velocity: slow     General Gait Details: limited to a few slow labored side steps with fair/poor tolerance for weight bearing on LLE due to increased pain  Stairs            Wheelchair Mobility     Tilt Bed    Modified Rankin (Stroke Patients Only)       Balance Overall balance assessment: Needs assistance Sitting-balance support: Feet supported, No upper extremity supported Sitting balance-Leahy Scale: Fair Sitting balance - Comments: fair/good seated at EOB   Standing balance support: Reliant on assistive device for balance, During functional activity, Bilateral upper extremity supported Standing balance-Leahy Scale: Poor Standing balance comment: using RW                             Pertinent Vitals/Pain Pain Assessment Pain Assessment: 0-10 Pain Score: 8  Pain Location: left hip Pain Descriptors / Indicators: Grimacing, Sharp, Guarding Pain  Intervention(s): Limited activity within patient's tolerance, Monitored during session, Repositioned, Premedicated before session    Home Living Family/patient expects to be discharged to:: Private residence Living Arrangements: Alone Available Help at Discharge: Family;Available PRN/intermittently Type of Home: House Home Access: Stairs to  enter Entrance Stairs-Rails: Right;Left;Can reach both Entrance Stairs-Number of Steps: 3   Home Layout: One level Home Equipment: None      Prior Function Prior Level of Function : Independent/Modified Independent             Mobility Comments: Community ambulator without AD, drives, shops ADLs Comments: Independent     Extremity/Trunk Assessment   Upper Extremity Assessment Upper Extremity Assessment: Overall WFL for tasks assessed    Lower Extremity Assessment Lower Extremity Assessment: Generalized weakness;LLE deficits/detail LLE Deficits / Details: grossly -3/5 LLE: Unable to fully assess due to pain LLE Sensation: WNL LLE Coordination: WNL    Cervical / Trunk Assessment Cervical / Trunk Assessment: Normal  Communication   Communication Communication: No apparent difficulties Cueing Techniques: Verbal cues;Tactile cues  Cognition Arousal: Alert Behavior During Therapy: WFL for tasks assessed/performed Overall Cognitive Status: Within Functional Limits for tasks assessed                                          General Comments      Exercises     Assessment/Plan    PT Assessment Patient needs continued PT services  PT Problem List Decreased strength;Decreased activity tolerance;Decreased balance;Decreased mobility       PT Treatment Interventions DME instruction;Gait training;Stair training;Functional mobility training;Therapeutic activities;Therapeutic exercise;Balance training;Patient/family education    PT Goals (Current goals can be found in the Care Plan section)  Acute Rehab PT Goals Patient Stated Goal: return home after rehab PT Goal Formulation: With patient Time For Goal Achievement: 01/05/23 Potential to Achieve Goals: Good    Frequency Min 3X/week     Co-evaluation               AM-PAC PT "6 Clicks" Mobility  Outcome Measure Help needed turning from your back to your side while in a flat bed without  using bedrails?: A Lot Help needed moving from lying on your back to sitting on the side of a flat bed without using bedrails?: A Lot Help needed moving to and from a bed to a chair (including a wheelchair)?: A Lot Help needed standing up from a chair using your arms (e.g., wheelchair or bedside chair)?: A Lot Help needed to walk in hospital room?: A Lot Help needed climbing 3-5 steps with a railing? : Total 6 Click Score: 11    End of Session   Activity Tolerance: Patient tolerated treatment well;Patient limited by fatigue;Patient limited by pain Patient left: in chair;with call bell/phone within reach Nurse Communication: Mobility status PT Visit Diagnosis: Unsteadiness on feet (R26.81);Other abnormalities of gait and mobility (R26.89);Muscle weakness (generalized) (M62.81)    Time: 1025-1050 PT Time Calculation (min) (ACUTE ONLY): 25 min   Charges:   PT Evaluation $PT Eval Moderate Complexity: 1 Mod PT Treatments $Therapeutic Activity: 23-37 mins PT General Charges $$ ACUTE PT VISIT: 1 Visit         2:12 PM, 12/22/22 Ocie Bob, MPT Physical Therapist with [facility] 336 [phone] office [phone] mobile phone

## 2022-12-22 NOTE — Progress Notes (Signed)
Patient ID: Debra Henderson, female   DOB: November 01, 1945, 77 y.o.   MRN: [id number]

## 2022-12-22 NOTE — Progress Notes (Signed)
Foley removed, patient due to void.

## 2022-12-22 NOTE — TOC Initial Note (Signed)
Transition of Care [facility]) - Initial/Assessment Note    Patient Details  Name: Debra Henderson MRN: [id number] Date of Birth: 1945/09/03  [facility] of [facility]) CM/SW Contact:    Villa Herb, LCSWA Phone Number: 12/22/2022, 11:25 AM  Clinical Narrative:                 CSW updated that PT is recommending SNF for pt at D/C. CSW spoke with pt about SNF recommendation, pt states she is agreeable to SNF referral. Pt prefers SNF at [facility], CSW explained that SNF referral will be sent to requested facility for review. TOC to follow.  Expected Discharge Plan: Skilled Nursing Facility Barriers to Discharge: Continued Medical Work up   Patient Goals and CMS Choice Patient states their goals for this hospitalization and ongoing recovery are:: SNF CMS Medicare.gov Compare Post Acute Care list provided to:: Patient Choice offered to / list presented to : Patient      Expected Discharge Plan and Services In-house Referral: Clinical Social Work Discharge Planning Services: CM Consult Post Acute Care Choice: Skilled Nursing Facility Living arrangements for the past 2 months: Single Family Home                                      Prior Living Arrangements/Services Living arrangements for the past 2 months: Single Family Home Lives with:: Self Patient language and need for interpreter reviewed:: Yes Do you feel safe going back to the place where you live?: Yes      Need for Family Participation in Patient Care: Yes (Comment) Care giver support system in place?: Yes (comment)   Criminal Activity/Legal Involvement Pertinent to Current Situation/Hospitalization: No - Comment as needed  Activities of Daily Living   ADL Screening (condition at time of admission) Independently performs ADLs?: Yes (appropriate for developmental age) Is the patient deaf or have difficulty hearing?: Yes (pt says she has trouble hearing) Does the patient have difficulty seeing, even when wearing  glasses/contacts?: No Does the patient have difficulty concentrating, remembering, or making decisions?: No  Permission Sought/Granted Permission sought to share information with : Facility Medical sales representative (granddaughter, Dahlia Client, was in room)    Share Information with NAME: granddaughter, Dahlia Client was in room           Emotional Assessment Appearance:: Appears stated age Attitude/Demeanor/Rapport: Engaged Affect (typically observed): Accepting Orientation: : Oriented to Self, Oriented to Place, Oriented to  Time, Oriented to Situation Alcohol / Substance Use: Not Applicable Psych Involvement: No (comment)  Admission diagnosis:  Closed left femoral fracture (HCC) [S72.92XA] Closed pertrochanteric fracture of left femur, initial encounter (HCC) [S72.102A] Patient Active Problem List   Diagnosis Date Noted   Closed left femoral fracture (HCC) 12/20/2022   Hyponatremia 12/20/2022   Hypokalemia 12/20/2022   Tobacco abuse 12/20/2022   B12 deficiency 09/21/2021   Osteoporosis 09/14/2017   Essential hypertension 02/04/2017   Mixed hyperlipidemia 02/04/2017   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  Left lower extremity. 02/25/2012   PCP:  [name], MD Pharmacy:   [facility] [phone] - [place], [place] - [address] AT [facility] [address] [place] [address] Phone: [phone] Fax: [phone]  Social Determinants of Health (SDOH) Social History: SDOH Screenings   Food Insecurity: No Food Insecurity (12/20/2022)  Housing: Low Risk  (12/20/2022)  Transportation Needs: No Transportation Needs (12/20/2022)  Utilities: Not At Risk (12/20/2022)  Social Connections: Unknown (06/04/2021)   Received from [facility],  [facility]  Tobacco Use: Medium Risk (12/20/2022)   SDOH Interventions:     Readmission Risk Interventions    12/22/2022   11:24 AM  Readmission Risk Prevention Plan  Medication Screening Complete  Transportation Screening Complete

## 2022-12-22 NOTE — Progress Notes (Signed)
Patient slept the majority of the shift. Patient received PRN dilaudid once for pain 10/10.

## 2022-12-22 NOTE — Progress Notes (Signed)
Patient ID: Debra Henderson, female   DOB: 09-17-1945, 77 y.o.   MRN: [id number] Postop day 1 after IM nail left hip  BP 136/78   Pulse 98   Temp 97.6 F (36.4 C) (Oral)   Resp 16   Ht 5' (1.524 m)   Wt 47.3 kg   SpO2 97%   BMI 20.37 kg/m   Patient mentating well today dressing is dry there was some bleeding yesterday that was treated with reinforcement of the dressing  Thigh is not swollen there is no ecchymosis  Currently on Lovenox she can resume her Plavix  Postoperatively Plavix and aspirin or Plavix and reduced Lovenox can be used for DVT prevention

## 2022-12-22 NOTE — Plan of Care (Signed)
  Problem: Acute Rehab PT Goals(only PT should resolve) Goal: Pt Will Go Supine/Side To Sit Outcome: Progressing Flowsheets (Taken 12/22/2022 1413) Pt will go Supine/Side to Sit:  with moderate assist  with minimal assist Goal: Patient Will Transfer Sit To/From Stand Outcome: Progressing Flowsheets (Taken 12/22/2022 1413) Patient will transfer sit to/from stand:  with minimal assist  with moderate assist Goal: Pt Will Transfer Bed To Chair/Chair To Bed Outcome: Progressing Flowsheets (Taken 12/22/2022 1413) Pt will Transfer Bed to Chair/Chair to Bed:  with min assist  with mod assist Goal: Pt Will Ambulate Outcome: Progressing Flowsheets (Taken 12/22/2022 1413) Pt will Ambulate:  25 feet  with minimal assist  with moderate assist  with rolling walker   2:13 PM, 12/22/22 Ocie Bob, MPT Physical Therapist with [facility]: [name] [phone] office [phone] mobile phone

## 2022-12-23 ENCOUNTER — Telehealth: Payer: PPO | Admitting: Adult Health

## 2022-12-23 ENCOUNTER — Inpatient Hospital Stay: Payer: PPO | Admitting: Adult Health

## 2022-12-23 DIAGNOSIS — I10 Essential (primary) hypertension: Secondary | ICD-10-CM | POA: Diagnosis not present

## 2022-12-23 DIAGNOSIS — I739 Peripheral vascular disease, unspecified: Secondary | ICD-10-CM | POA: Diagnosis not present

## 2022-12-23 DIAGNOSIS — E871 Hypo-osmolality and hyponatremia: Secondary | ICD-10-CM | POA: Diagnosis not present

## 2022-12-23 DIAGNOSIS — S72002A Fracture of unspecified part of neck of left femur, initial encounter for closed fracture: Secondary | ICD-10-CM | POA: Diagnosis not present

## 2022-12-23 NOTE — Progress Notes (Signed)
PROGRESS NOTE  Debra Henderson, is a 77 y.o. female, DOB - 07-Dec-1945, WUJ:[id number]  Admit date - 12/20/2022   Admitting Physician Frankey Shown, DO  Outpatient Primary MD for the patient is [name], MD  LOS - 3  Chief Complaint  Patient presents with   Fall       Brief Narrative:  77 y.o. female with medical history significant of hypertension, hyperlipidemia, PAD admitted on 12/20/2022 with left hip fracture after mechanical fall at home - PT eval appreciated ,  -patient is medically ready/medically stable for transfer to  SNF rehab when SNF bed can be obtained and insurance authorization is available    -Assessment and Plan: 1)LT Hip Fx--- s/p mechanical fall at home while walking down the steps -Orthopedic consult appreciated -On 12/21/2022 after allowing for Plavix washout pt underwent ORIF with IM nail  -As needed opiates for pain control Postoperative plan Weightbearing as tolerated Staples if present can be removed postop day 12-14 Anticoagulation for 28 days--give Xarelto 10 mg for DVT prophylaxis while in the hospital, anticipate discharge on aspirin 81 daily for 1 month (also continue PTA Plavix indefinitely)--- anticipate she will be more active and mobile at SNF facility Follow-up visit at 4 weeks for x-rays and then x-rays at 6 weeks and 12 weeks -Further management by orthopedic team  2)HTN-hold AVapro -Continue with IV hydralazine as needed elevated BP  3) COPD --no further tobacco use -No exacerbation -Incruse Ellipta as ordered -As needed albuterol neb  4) acute hypoxic respiratory failure--suspect underlying COPD (patient is a smoker) -Currently requiring 2 L of oxygen via nasal cannula --Started Incruse Ellipta -As needed albuterol nebs -Incentive spirometry as ordered 12/23/22 Attempted to wean off O2 continues to require 2 L of oxygen via nasal cannula at this time -Anticipate she will be weaned off O2 prior to discharge to SNF  rehab -Repeat chest x-ray on 12/24/2022 if hypoxia persist, patient was not on O2 prior to admission  5)PAD--c/n  Plavix and pravastatin on 12/23/2022  6) leukocytosis--- this is reactive in the setting of hip fracture and surgery -Repeat CBC -Chest x-ray as above #4  7) chronic hyponatremia----sodium is currently 129 which is not far from patient's baseline -Avoid excessive free water  Status is: Inpatient   Disposition: The patient is from: Home              Anticipated d/c is to: SNF              Anticipated d/c date is: 1 day              Patient currently is medically stable to d/c. Barriers: -patient is medically ready/medically stable for transfer to  SNF rehab when SNF bed can be obtained and insurance authorization is available  Code Status :  -  Code Status: Full Code   Family Communication:    NA (patient is alert, awake and coherent)   DVT Prophylaxis  :   - SCDs  rivaroxaban (XARELTO) tablet 10 mg Start: 12/22/22 1715 SCDs Start: 12/20/22 0607 rivaroxaban (XARELTO) tablet 10 mg  Lab Results  Component Value Date   PLT 239 12/22/2022   Inpatient Medications  Scheduled Meds:  clopidogrel  75 mg Oral Daily   methocarbamol  500 mg Oral TID   mupirocin ointment  1 Application Nasal BID   nicotine  14 mg Transdermal Daily   pravastatin  40 mg Oral q1800   rivaroxaban  10 mg Oral Q supper   umeclidinium bromide  1 puff Inhalation Daily   Continuous Infusions:   PRN Meds:.acetaminophen **OR** acetaminophen, albuterol, hydrALAZINE, HYDROmorphone (DILAUDID) injection, ondansetron **OR** ondansetron (ZOFRAN) IV, oxyCODONE   Anti-infectives (From admission, onward)    Start     Dose/Rate Route Frequency Ordered Stop   12/21/22 1100  ceFAZolin (ANCEF) IVPB 2g/100 mL premix  Status:  Discontinued        2 g 200 mL/hr over 30 Minutes Intravenous On call to O.R. 12/21/22 1048 12/21/22 1449   12/21/22 1028  ceFAZolin (ANCEF) 2-4 GM/100ML-% IVPB       Note to Pharmacy:  Daphane Shepherd H: cabinet override      12/21/22 1028 12/21/22 2244      Subjective: Debra Henderson today has no fevers, no emesis,  No chest pain,   - Eating and drinking well, pain control adequate -patient is medically ready/medically stable for transfer to  SNF rehab when SNF bed can be obtained and insurance authorization is available - Attempted to wean off O2 continues to require 2 L of oxygen via nasal cannula at this time  Objective: Vitals:   12/23/22 0715 12/23/22 1457 12/23/22 1706 12/23/22 1714  BP:  139/69    Pulse:  100    Resp:  17    Temp:  97.6 F (36.4 C)    TempSrc:  Oral    SpO2: 94% 95% 90% 93%  Weight:      Height:        Intake/Output Summary (Last 24 hours) at 12/23/2022 1719 Last data filed at 12/23/2022 0956 Gross per 24 hour  Intake 240 ml  Output 650 ml  Net -410 ml   Filed Weights   12/20/22 0136 12/20/22 0624  Weight: 47.2 kg 47.3 kg   Physical Exam  Gen:- Awake Alert,  in no apparent distress  HEENT:- [place].AT, No sclera icterus Nose- [place] 2L/min Neck-Supple Neck,No JVD,.  Lungs-No wheezing, fair symmetrical air movement CV- S1, S2 normal, regular , 2/6 SM Abd-  +ve B.Sounds, Abd Soft, No tenderness,    Extremity/Skin:- No  edema, pedal pulses present  Psych-affect is appropriate, oriented x3 Neuro-no new focal deficits, no tremors MSK--left hip post op wound is clean dry and intact  Data Reviewed: I have personally reviewed following labs and imaging studies  CBC: Recent Labs  Lab 12/20/22 0317 12/21/22 0420 12/22/22 0942  WBC 5.7 8.9 12.2*  NEUTROABS 3.7  --   --   HGB 10.2* 9.2* 8.2*  HCT 30.1* 27.6* 24.7*  MCV 96.2 97.9 99.2  PLT 350 269 239   Basic Metabolic Panel: Recent Labs  Lab 12/20/22 0317 12/20/22 0650 12/20/22 1218 12/20/22 1802 12/21/22 0420 12/22/22 0942  NA 129*  --  130* 130* 127* 129*  K 3.4*  --  4.2 3.8 3.7 4.0  CL 93*  --  99 101 97* 99  CO2 23  --  25 21* 22 21*  GLUCOSE 111*  --  98 130*  107* 148*  BUN 23  --  14 13 11 14   CREATININE 0.78  --  0.63 0.59 0.55 0.66  CALCIUM 9.3  --  8.3* 8.3* 8.4* 8.4*  MG  --  2.1  --   --   --   --   PHOS  --  3.3  --   --   --   --    GFR: Estimated Creatinine Clearance: 43.6 mL/min (by C-G formula based on SCr of 0.66 mg/dL). Liver Function Tests: Recent Labs  Lab 12/20/22 [phone]  12/21/22 0420  AST 16 15  ALT 13 9  ALKPHOS 49 43  BILITOT 0.3 0.6  PROT 6.9 5.8*  ALBUMIN 3.7 2.9*   Scheduled Meds:  clopidogrel  75 mg Oral Daily   methocarbamol  500 mg Oral TID   mupirocin ointment  1 Application Nasal BID   nicotine  14 mg Transdermal Daily   pravastatin  40 mg Oral q1800   rivaroxaban  10 mg Oral Q supper   umeclidinium bromide  1 puff Inhalation Daily   Continuous Infusions:   LOS: 3 days   Shon Hale M.D on 12/23/2022 at 5:19 PM  Go to [email or web address] - for contact info  [facility] - Office  [phone]  If 7PM-7AM, please contact night-coverage [email or web address] 12/23/2022, 5:19 PM

## 2022-12-23 NOTE — Plan of Care (Signed)
  Problem: Acute Rehab OT Goals (only OT should resolve) Goal: Pt. Will Perform Grooming Flowsheets (Taken 12/23/2022 1104) Pt Will Perform Grooming:  with contact guard assist  standing Goal: Pt. Will Perform Lower Body Bathing Flowsheets (Taken 12/23/2022 1104) Pt Will Perform Lower Body Bathing:  with modified independence  sitting/lateral leans Goal: Pt. Will Perform Lower Body Dressing Flowsheets (Taken 12/23/2022 1104) Pt Will Perform Lower Body Dressing:  with modified independence  sitting/lateral leans Goal: Pt. Will Transfer To Toilet Flowsheets (Taken 12/23/2022 1104) Pt Will Transfer to Toilet:  with contact guard assist  stand pivot transfer Goal: Pt. Will Perform Toileting-Clothing Manipulation Flowsheets (Taken 12/23/2022 1104) Pt Will Perform Toileting - Clothing Manipulation and hygiene:  with modified independence  sitting/lateral leans  Jearlene Bridwell OT, MOT

## 2022-12-23 NOTE — TOC Progression Note (Signed)
Transition of Care [facility]) - Progression Note    Patient Details  Name: DEVANEY LACAYO MRN: [id number] Date of Birth: 08/09/1945  [facility] CM/SW Contact  Villa Herb, [place] Phone Number: 12/23/2022, 10:44 AM  Clinical Narrative:    CSW met with pt at bedside to review bed offers. [facility] unable to offer, this was pts first choice. Pt would like to accept bed offer from [facility]. CSW updated Destiny in admissions of this. CSW updated insurance auth to reflect bed choice, [facility] is still pending at this time. HTA will call with an update. TOC to follow.   Expected Discharge Plan: Skilled Nursing Facility Barriers to Discharge: Continued Medical Work up  Expected Discharge Plan and Services In-house Referral: Clinical Social Work Discharge Planning Services: CM Consult Post Acute Care Choice: Skilled Nursing Facility Living arrangements for the past 2 months: Single Family Home                                       Social Determinants of Health (SDOH) Interventions SDOH Screenings   Food Insecurity: No Food Insecurity (12/20/2022)  Housing: Low Risk  (12/20/2022)  Transportation Needs: No Transportation Needs (12/20/2022)  Utilities: Not At Risk (12/20/2022)  Social Connections: Unknown (06/04/2021)   Received from [facility], [facility]  Tobacco Use: Medium Risk (12/20/2022)    Readmission Risk Interventions    12/22/2022   11:24 AM  Readmission Risk Prevention Plan  Medication Screening Complete  Transportation Screening Complete

## 2022-12-23 NOTE — Evaluation (Signed)
Occupational Therapy Evaluation Patient Details Name: Debra Henderson MRN: [id number] DOB: 08/02/1945 Today's Date: 12/23/2022   History of Present Illness Debra Henderson is a 77 y.o. female s/p Open treatment internal fixation left hip with IM nail on 12/21/22, with medical history significant of hypertension, hyperlipidemia, PAD who presents to the emergency department from home via EMS due to mechanical fall sustained while walking down the steps.  Patient was on Plavix due to PAD.  She denies hitting her head or loss of consciousness, but she complains of left hip pain.  She had a c-collar on arrival to the ED.   Clinical Impression   Pt agreeable to OT evaluation. Pt required much extended time for supine to sit bed mobility. Not a significant amount of physical assist, but much time. Mod A needed for transfer to chair from EOB. B UE strength appears WFL. Some assist for lower body ADL's at this time, mostly for L LE.  Pt was left in the chair with call bell within reach. Pt will benefit from continued OT in the hospital and recommended venue below to increase strength, balance, and endurance for safe ADL's.         If plan is discharge home, recommend the following: A lot of help with walking and/or transfers;A lot of help with bathing/dressing/bathroom;Assistance with cooking/housework;Assist for transportation;Help with stairs or ramp for entrance    Functional Status Assessment  Patient has had a recent decline in their functional status and demonstrates the ability to make significant improvements in function in a reasonable and predictable amount of time.  Equipment Recommendations  None recommended by OT           Precautions / Restrictions Precautions Precautions: Fall Restrictions Weight Bearing Restrictions: No      Mobility Bed Mobility Overal bed mobility: Needs Assistance Bed Mobility: Supine to Sit     Supine to sit: Mod assist     General bed  mobility comments: Much increased time and labored movement    Transfers Overall transfer level: Needs assistance Equipment used: Rolling walker (2 wheels) Transfers: Bed to chair/wheelchair/BSC, Sit to/from Stand Sit to Stand: Mod assist     Step pivot transfers: Mod assist     General transfer comment: slow labored movement; cuing for use of RW to assist with bearing weight through L LE.      Balance Overall balance assessment: Needs assistance Sitting-balance support: Feet supported, No upper extremity supported Sitting balance-Leahy Scale: Good Sitting balance - Comments: seated at EOB   Standing balance support: Reliant on assistive device for balance, During functional activity, Bilateral upper extremity supported Standing balance-Leahy Scale: Poor Standing balance comment: using RW                           ADL either performed or assessed with clinical judgement   ADL Overall ADL's : Needs assistance/impaired     Grooming: Set up;Sitting   Upper Body Bathing: Set up;Sitting   Lower Body Bathing: Moderate assistance;Sitting/lateral leans       Lower Body Dressing: Moderate assistance;Sitting/lateral leans;Minimal assistance Lower Body Dressing Details (indicate cue type and reason): Assist to don L sock seated in recliner. Toilet Transfer: Moderate assistance;Stand-pivot;Rolling walker (2 wheels) Toilet Transfer Details (indicate cue type and reason): Simulated via EOB to chair transfer. Toileting- Clothing Manipulation and Hygiene: Moderate assistance;Sitting/lateral lean       Functional mobility during ADLs: Moderate assistance;Rolling walker (2 wheels)  Vision Baseline Vision/History: 0 No visual deficits Ability to See in Adequate Light: 0 Adequate Patient Visual Report: Other (comment);No change from baseline (hostory of cataracts removal) Vision Assessment?: No apparent visual deficits     Perception Perception: Not tested        Praxis Praxis: Not tested       Pertinent Vitals/Pain Pain Assessment Pain Assessment: Faces Faces Pain Scale: Hurts even more Pain Location: left hip Pain Descriptors / Indicators: Grimacing, Sharp, Guarding Pain Intervention(s): Limited activity within patient's tolerance, Monitored during session, Repositioned     Extremity/Trunk Assessment Upper Extremity Assessment Upper Extremity Assessment: Overall WFL for tasks assessed   Lower Extremity Assessment Lower Extremity Assessment: Defer to PT evaluation   Cervical / Trunk Assessment Cervical / Trunk Assessment: Normal   Communication Communication Communication: No apparent difficulties   Cognition Arousal: Alert Behavior During Therapy: WFL for tasks assessed/performed Overall Cognitive Status: Within Functional Limits for tasks assessed                                                        Home Living Family/patient expects to be discharged to:: Private residence Living Arrangements: Alone Available Help at Discharge: Family;Available PRN/intermittently Type of Home: House Home Access: Stairs to enter [employer] of Steps: 3 Entrance Stairs-Rails: Right;Left;Can reach both Home Layout: One level     Bathroom Shower/Tub: Chief Strategy Officer: Standard Bathroom Accessibility: Yes   Home Equipment: None          Prior Functioning/Environment Prior Level of Function : Independent/Modified Independent             Mobility Comments: Community ambulator without AD, drives, shops ADLs Comments: Independent        OT Problem List: Decreased strength;Decreased activity tolerance;Impaired balance (sitting and/or standing);Pain      OT Treatment/Interventions: Self-care/ADL training;Therapeutic exercise;Therapeutic activities;Patient/family education;DME and/or AE instruction    OT Goals(Current goals can be found in the care plan section) Acute Rehab  OT Goals Patient Stated Goal: improve function at rehab OT Goal Formulation: With patient Time For Goal Achievement: 01/06/23 Potential to Achieve Goals: Good  OT Frequency: Min 2X/week                                   End of Session Equipment Utilized During Treatment: Rolling walker (2 wheels) Nurse Communication: Other (comment) (NT notified the pt was in the chair.)  Activity Tolerance: Patient tolerated treatment well Patient left: in chair;with call bell/phone within reach  OT Visit Diagnosis: Unsteadiness on feet (R26.81);Other abnormalities of gait and mobility (R26.89);History of falling (Z91.81)                Time: 1610-9604 OT Time Calculation (min): 27 min Charges:  OT General Charges $OT Visit: 1 Visit OT Evaluation $OT Eval Low Complexity: 1 Low  Ander Wamser OT, MOT   Danie Chandler 12/23/2022, 11:01 AM

## 2022-12-24 ENCOUNTER — Inpatient Hospital Stay (HOSPITAL_COMMUNITY): Payer: PPO

## 2022-12-24 DIAGNOSIS — S72145A Nondisplaced intertrochanteric fracture of left femur, initial encounter for closed fracture: Secondary | ICD-10-CM | POA: Diagnosis not present

## 2022-12-24 LAB — CBC
HCT: 23.4 % — ABNORMAL LOW (ref 36.0–46.0)
Hemoglobin: 7.6 g/dL — ABNORMAL LOW (ref 12.0–15.0)
MCH: 31.8 pg (ref 26.0–34.0)
MCHC: 32.5 g/dL (ref 30.0–36.0)
MCV: 97.9 fL (ref 80.0–100.0)
Platelets: 284 10*3/uL (ref 150–400)
RBC: 2.39 MIL/uL — ABNORMAL LOW (ref 3.87–5.11)
RDW: 13 % (ref 11.5–15.5)
WBC: 7.1 10*3/uL (ref 4.0–10.5)
nRBC: 0 % (ref 0.0–0.2)

## 2022-12-24 LAB — BASIC METABOLIC PANEL
Anion gap: 7 (ref 5–15)
BUN: 13 mg/dL (ref 8–23)
CO2: 25 mmol/L (ref 22–32)
Calcium: 8.4 mg/dL — ABNORMAL LOW (ref 8.9–10.3)
Chloride: 97 mmol/L — ABNORMAL LOW (ref 98–111)
Creatinine, Ser: 0.54 mg/dL (ref 0.44–1.00)
GFR, Estimated: >60 mL/min (ref >60)
Glucose, Bld: 103 mg/dL — ABNORMAL HIGH (ref 70–99)
Potassium: 3.7 mmol/L (ref 3.5–5.1)
Sodium: 129 mmol/L — ABNORMAL LOW (ref 135–145)

## 2022-12-24 MED ORDER — ACETAMINOPHEN 325 MG PO TABS: 650.0000 mg | ORAL_TABLET | Freq: Four times a day (QID) | ORAL | 0 refills | Status: AC | PRN

## 2022-12-24 MED ORDER — NICOTINE 14 MG/24HR TD PT24: 14.0000 mg | MEDICATED_PATCH | Freq: Every day | TRANSDERMAL | 0 refills | Status: DC

## 2022-12-24 MED ORDER — METHOCARBAMOL 500 MG PO TABS: 500.0000 mg | ORAL_TABLET | Freq: Three times a day (TID) | ORAL | 0 refills | Status: AC

## 2022-12-24 MED ORDER — OXYCODONE HCL 5 MG PO TABS: 5.0000 mg | ORAL_TABLET | ORAL | 0 refills | Status: AC | PRN

## 2022-12-24 MED ORDER — ASPIRIN 81 MG PO TBEC: 81.0000 mg | DELAYED_RELEASE_TABLET | Freq: Every day | ORAL | 2 refills | Status: DC

## 2022-12-24 NOTE — Care Management Important Message (Signed)
Important Message  Patient Details  Name: Debra Henderson MRN: [id number] Date of Birth: 1945-06-25   Important Message Given:  Yes - Medicare IM     Corey Harold 12/24/2022, 10:10 AM

## 2022-12-24 NOTE — Plan of Care (Signed)

## 2022-12-24 NOTE — Discharge Summary (Signed)
Physician Discharge Summary   Patient: Debra Henderson MRN: [id number] DOB: 1945/03/19  Admit date:     12/20/2022  Discharge date: 12/25/22  Discharge Physician: Kendell Bane   PCP: [name], MD   Recommendations at discharge:   Ambulate is much as possible, with fall precautions,continue aggressive PT OT Follow-up with PCP in 1-2 weeks Orthopedic follow-up visit at 4 weeks for x-rays and then x-rays at 6 weeks and 12 weeks -Further management by orthopedic team For DVT Prophylaxis continue with aspirin and Plavix Staples if present can be removed postop day 12-14  Discharge Diagnoses: Principal Problem:   Closed left femoral fracture (HCC) Active Problems:   Tobacco abuse   PAD (peripheral artery disease) (HCC)   Ductal carcinoma in situ (DCIS) of right breast   Ductal carcinoma in situ (DCIS) of left breast   Essential hypertension   Mixed hyperlipidemia   Hyponatremia   Hypokalemia  Resolved Problems:   * No resolved hospital problems. *  Hospital Course:   77 y.o. female with medical history significant of hypertension, hyperlipidemia, PAD admitted on 12/20/2022 with left hip fracture after mechanical fall at home - PT eval appreciated ,  -patient is medically ready/medically stable for transfer to  SNF rehab when SNF bed can be obtained and insurance authorization is available     -Assessment and Plan: 1)LT Hip Fx--- s/p mechanical fall at home while walking down the steps -Orthopedic consult appreciated -On 12/21/2022 after allowing for Plavix washout pt underwent ORIF with IM nail  -As needed opiates for pain control Postoperative plan Weightbearing as tolerated 77 y.o. female with medical history significant of hypertension, hyperlipidemia, PAD admitted on 12/20/2022 with left hip fracture after mechanical fall at home  PT eval  -patient is medically ready/medically stable for transfer to  SNF rehab when SNF bed can be obtained and insurance  authorization is available     -Assessment and Plan: 1)LT Hip Fx--- s/p mechanical fall at home while walking down the steps -Orthopedic consult appreciated -On 12/21/2022 after allowing for Plavix washout pt underwent ORIF with IM nail  -As needed opiates for pain control Postoperative plan Weightbearing as tolerated Staples if present can be removed postop day 12-14 Anticoagulation for 28 days--give Xarelto 10 mg for DVT prophylaxis while in the hospital, anticipate discharge on aspirin 81 daily for 1 month (also continue PTA Plavix indefinitely)--- anticipate she will be more active and mobile at SNF facility Follow-up visit at 4 weeks for x-rays and then x-rays at 6 weeks and 12 weeks -Further management by orthopedic team   2)HTN-hold AVapro -Continue with IV hydralazine as needed elevated BP   3) COPD --no further tobacco use -No exacerbation -Incruse Ellipta as ordered -As needed albuterol neb   4) acute hypoxic respiratory failure--suspect underlying COPD (patient is a smoker) -Currently requiring 2 L of oxygen via nasal cannula --Started Incruse Ellipta -As needed albuterol nebs -Incentive spirometry as ordered 12/23/22 Attempted to wean off O2 continues to require 2 L of oxygen via nasal cannula at this time -Anticipate she will be weaned off O2 prior to discharge to SNF rehab -Repeat chest x-ray on 12/24/2022 if hypoxia persist, patient was not on O2 prior to admission   5)PAD--c/n  Plavix and pravastatin on 12/23/2022   6) leukocytosis--- this is reactive in the setting of hip fracture and surgery -Repeat CBC -Chest x-ray as above #4   7) chronic hyponatremia----sodium is currently 129 which is not far from patient's baseline -Avoid excessive  free water  -Continue added sodium supplements  8) acute on chronic anemia No signs of bleeding -Slight drop in H&H noted, Xarelto discontinued Continue with aspirin and Plavix         Disposition: The patient is  from: Home              Anticipated d/c is to: SNF              Barriers: -patient is medically ready/medically stable for transfer to  SNF - insurance authorization is available   Code Status :  -  Code Status: Full Code     Pain control - [employer] Controlled Substance Reporting System database was reviewed. and patient was instructed, not to drive, operate heavy machinery, perform activities at heights, swimming or participation in water activities or provide baby-sitting services while on Pain, Sleep and Anxiety Medications; until their outpatient Physician has advised to do so again. Also recommended to not to take more than prescribed Pain, Sleep and Anxiety Medications.  Consultants: Ortho  Disposition: Skilled nursing facility Diet recommendation:  Discharge Diet Orders (From admission, onward)     Start     Ordered   12/24/22 0000  Diet - low sodium heart healthy        12/24/22 0914           Regular diet DISCHARGE MEDICATION: Allergies as of 12/25/2022       Reactions   Tetracyclines & Related Nausea And Vomiting, Other (See Comments)   Vomiting blood   Vytorin [ezetimibe-simvastatin] Other (See Comments)   Per Dr Erlene Quan note   Chicken Allergy Diarrhea, Rash, Other (See Comments)   Egg-derived Products Diarrhea, Rash, Other (See Comments)   Erythromycin Rash   Lipitor [atorvastatin] Rash, Other (See Comments)   Myalgias   Penicillins Rash, Swelling, Other (See Comments)   Sulfa Antibiotics Swelling, Rash        Medication List     STOP taking these medications    anastrozole 1 MG tablet Commonly known as: ARIMIDEX   olmesartan-hydrochlorothiazide 20-12.5 MG tablet Commonly known as: BENICAR HCT       TAKE these medications    acetaminophen 325 MG tablet Commonly known as: TYLENOL Take 2 tablets (650 mg total) by mouth every 6 (six) hours as needed for mild pain (pain score 1-3) (or Fever >/= 101).   aspirin EC 81 MG tablet Take 1  tablet (81 mg total) by mouth daily. Swallow whole.   B-12 1000 MCG Subl Place 1,000 mcg under the tongue daily. Start 09/15/22   cholecalciferol 25 MCG (1000 UNIT) tablet Commonly known as: VITAMIN D3 Take 1,000 Units by mouth daily.   clopidogrel 75 MG tablet Commonly known as: PLAVIX TAKE1 TABLET EVERY DAY   ferrous sulfate 325 (65 FE) MG EC tablet Take 325 mg by mouth 3 (three) times daily with meals.   methocarbamol 500 MG tablet Commonly known as: ROBAXIN Take 1 tablet (500 mg total) by mouth 3 (three) times daily for 3 days.   nicotine 14 mg/24hr patch Commonly known as: NICODERM CQ - dosed in mg/24 hours Place 1 patch (14 mg total) onto the skin daily.   omega-3 acid ethyl esters 1 g capsule Commonly known as: LOVAZA Take 1 g by mouth 3 (three) times daily.   oxyCODONE 5 MG immediate release tablet Commonly known as: Oxy IR/ROXICODONE Take 1 tablet (5 mg total) by mouth every 4 (four) hours as needed for up to 3 days for moderate  pain (pain score 4-6).   pravastatin 10 MG tablet Commonly known as: PRAVACHOL Take 10 mg by mouth daily.         Contact information for after-discharge care     Destination     [facility] Preferred SNF .   Service: Skilled Nursing Contact information: [address] [place] [address] [phone]                    Discharge Exam: Ceasar Mons Weights   12/20/22 0136 12/20/22 0624  Weight: 47.2 kg 47.3 kg      General:  AAO x 3,  cooperative, no distress;   HEENT:  Normocephalic, PERRL, otherwise with in Normal limits   Neuro:  CNII-XII intact. , normal motor and sensation, reflexes intact   Lungs:   Clear to auscultation BL, Respirations unlabored,  No wheezes / crackles  Cardio:    S1/S2, RRR, No murmure, No Rubs or Gallops   Abdomen:  Soft, non-tender, bowel sounds active all four quadrants, no guarding or peritoneal signs.  Muscular  skeletal:  Limited exam -global  generalized weaknesses - in bed, able to move all 4 extremities,   2+ pulses,  symmetric, No pitting edema  Skin:  Dry, warm to touch, negative for any Rashes,  Wounds: Please see nursing documentation        Condition at discharge: good  The results of significant diagnostics from this hospitalization (including imaging, microbiology, ancillary and laboratory) are listed below for reference.   Imaging Studies: DG CHEST PORT 1 VIEW  Result Date: 12/24/2022 CLINICAL DATA:  Dyspnea. EXAM: PORTABLE CHEST 1 VIEW COMPARISON:  February 03, 2017. FINDINGS: The heart size and mediastinal contours are within normal limits. Mild left basilar atelectasis or infiltrate is noted. Nodular density is noted in right upper lobe. The visualized skeletal structures are unremarkable. IMPRESSION: Mild left basilar atelectasis or infiltrate is noted. Nodular density is noted in right upper lobe. CT scan of the chest is recommended for further evaluation. Electronically Signed   By: Lupita Raider M.D.   On: 12/24/2022 10:39   DG HIP UNILAT WITH PELVIS 2-3 VIEWS LEFT  Result Date: 12/21/2022 CLINICAL DATA:  Postop. EXAM: DG HIP (WITH OR WITHOUT PELVIS) 2-3V LEFT COMPARISON:  Preoperative imaging. FINDINGS: Tibial intramedullary nail with trans trochanteric and distal locking screw fixation traverse intertrochanteric femur fracture. No new fracture. Recent postsurgical change includes air and edema in the soft tissues. IMPRESSION: ORIF of intertrochanteric femur fracture without immediate postoperative complication. Electronically Signed   By: Narda Rutherford M.D.   On: 12/21/2022 14:09   DG HIP UNILAT WITH PELVIS 2-3 VIEWS LEFT  Result Date: 12/21/2022 CLINICAL DATA:  Left hip fracture. EXAM: DG HIP (WITH OR WITHOUT PELVIS) 2-3V LEFT COMPARISON:  Preoperative imaging FINDINGS: Eight fluoroscopic spot views of the left hip obtained in the operating room. Femoral intramedullary nail with trans trochanteric and  distal locking screw fixation traverse proximal femur fracture. Fluoroscopy time 5 minutes 51 seconds, dose 39.21 mGy. IMPRESSION: Intraoperative fluoroscopy during left hip fracture fixation. Electronically Signed   By: Narda Rutherford M.D.   On: 12/21/2022 14:08   DG C-Arm 1-60 Min-No Report  Result Date: 12/21/2022 Fluoroscopy was utilized by the requesting physician.  No radiographic interpretation.   DG Hip Unilat W or Wo Pelvis 2-3 Views Left  Result Date: 12/20/2022 CLINICAL DATA:  Status post trauma with subsequent left hip pain. EXAM: DG HIP (WITH OR WITHOUT PELVIS) 2-3V LEFT  COMPARISON:  None Available. FINDINGS: There is an acute, mildly displaced fracture deformity seen extending through the inter trochanteric region of the proximal left femur. There is no evidence of dislocation. A radiopaque vascular stent is noted overlying the superomedial aspect of the pelvis on the left. IMPRESSION: Acute fracture of the proximal left femur. Electronically Signed   By: Aram Candela M.D.   On: 12/20/2022 02:30    Microbiology: Results for orders placed or performed during the hospital encounter of 12/20/22  Surgical PCR screen     Status: Abnormal   Collection Time: 12/20/22  3:36 PM   Specimen: Nasal Mucosa; Nasal Swab  Result Value Ref Range Status   MRSA, PCR NEGATIVE NEGATIVE Final   Staphylococcus aureus POSITIVE (A) NEGATIVE Final    Comment: RESULT CALLED TO, READ BACK BY AND VERIFIED WITH: SKYLER HARDIN [id number], VIRAY,J (NOTE) The Xpert SA Assay (FDA approved for NASAL specimens in patients 3 years of age and older), is one component of a comprehensive surveillance program. It is not intended to diagnose infection nor to guide or monitor treatment. Performed at [facility], [address], [place], [place] [address]     Labs: CBC: Recent Labs  Lab 12/20/22 [phone] 12/21/22 0420 12/22/22 0942 12/24/22 0414  WBC 5.7 8.9 12.2* 7.1  NEUTROABS 3.7  --   --   --    HGB 10.2* 9.2* 8.2* 7.6*  HCT 30.1* 27.6* 24.7* 23.4*  MCV 96.2 97.9 99.2 97.9  PLT 350 269 239 284   Basic Metabolic Panel: Recent Labs  Lab 12/20/22 0650 12/20/22 1218 12/20/22 1802 12/21/22 0420 12/22/22 0942 12/24/22 0414  NA  --  130* 130* 127* 129* 129*  K  --  4.2 3.8 3.7 4.0 3.7  CL  --  99 101 97* 99 97*  CO2  --  25 21* 22 21* 25  GLUCOSE  --  98 130* 107* 148* 103*  BUN  --  14 13 11 14 13   CREATININE  --  0.63 0.59 0.55 0.66 0.54  CALCIUM  --  8.3* 8.3* 8.4* 8.4* 8.4*  MG 2.1  --   --   --   --   --   PHOS 3.3  --   --   --   --   --    Liver Function Tests: Recent Labs  Lab 12/20/22 0317 12/21/22 0420  AST 16 15  ALT 13 9  ALKPHOS 49 43  BILITOT 0.3 0.6  PROT 6.9 5.8*  ALBUMIN 3.7 2.9*   CBG: Recent Labs  Lab 12/22/22 2004  GLUCAP 157*    Discharge time spent: greater than 40 minutes.  Signed: Kendell Bane, MD [facility] 12/25/2022

## 2022-12-24 NOTE — Plan of Care (Signed)
  Problem: Education: Goal: Knowledge of General Education information will improve Description: Including pain rating scale, medication(s)/side effects and non-pharmacologic comfort measures Outcome: Progressing   Problem: Health Behavior/Discharge Planning: Goal: Ability to manage health-related needs will improve Outcome: Progressing   Problem: Clinical Measurements: Goal: Ability to maintain clinical measurements within normal limits will improve Outcome: Progressing Goal: Will remain free from infection Outcome: Progressing Goal: Diagnostic test results will improve Outcome: Progressing Goal: Respiratory complications will improve Outcome: Progressing Goal: Cardiovascular complication will be avoided Outcome: Progressing   Problem: Activity: Goal: Risk for activity intolerance will decrease Outcome: Progressing   Problem: Nutrition: Goal: Adequate nutrition will be maintained Outcome: Progressing   Problem: Coping: Goal: Level of anxiety will decrease Outcome: Progressing   Problem: Elimination: Goal: Will not experience complications related to bowel motility Outcome: Progressing Goal: Will not experience complications related to urinary retention Outcome: Progressing   Problem: Pain Management: Goal: General experience of comfort will improve Outcome: Progressing   Problem: Safety: Goal: Ability to remain free from injury will improve Outcome: Progressing   Problem: Skin Integrity: Goal: Risk for impaired skin integrity will decrease Outcome: Progressing   Problem: Education: Goal: Verbalization of understanding the information provided (i.e., activity precautions, restrictions, etc) will improve Outcome: Progressing   Problem: Activity: Goal: Ability to ambulate and perform ADLs will improve Outcome: Progressing   Problem: Clinical Measurements: Goal: Postoperative complications will be avoided or minimized Outcome: Progressing   Problem:  Self-Concept: Goal: Ability to maintain and perform role responsibilities to the fullest extent possible will improve Outcome: Progressing   Problem: Pain Management: Goal: Pain level will decrease Outcome: Progressing

## 2022-12-24 NOTE — Progress Notes (Signed)
Physical Therapy Treatment Patient Details Name: Debra Henderson MRN: [id number] DOB: 02/12/1945 Today's Date: 12/24/2022   History of Present Illness Debra Henderson is a 77 y.o. female s/p Open treatment internal fixation left hip with IM nail on 12/21/22, with medical history significant of hypertension, hyperlipidemia, PAD who presents to the emergency department from home via EMS due to mechanical fall sustained while walking down the steps.  Patient was on Plavix due to PAD.  She denies hitting her head or loss of consciousness, but she complains of left hip pain.  She had a c-collar on arrival to the ED.    PT Comments  Patient demonstrates slow labored movement for sitting up at bedside requiring with difficulty moving LLE due to increased pain, slightly increased tolerance for taking steps at bed side and weightbearing on LLE and required active assistance for completing most LLE exercises while seated at bedside.  Patient tolerated sitting up in chair after therapy - nursing staff notified.  Patient will benefit from continued skilled physical therapy in hospital and recommended venue below to increase strength, balance, endurance for safe ADLs and gait.       If plan is discharge home, recommend the following: A lot of help with bathing/dressing/bathroom;A lot of help with walking and/or transfers;Help with stairs or ramp for entrance;Assistance with cooking/housework   Can travel by private vehicle     No  Equipment Recommendations  Rolling walker (2 wheels)    Recommendations for Other Services       Precautions / Restrictions Precautions Precautions: Fall Restrictions Weight Bearing Restrictions: No     Mobility  Bed Mobility Overal bed mobility: Needs Assistance Bed Mobility: Supine to Sit     Supine to sit: Mod assist     General bed mobility comments: increased time, labored movemnt with poor tolerance for moving LLE due to increasing pain     Transfers Overall transfer level: Needs assistance Equipment used: Rolling walker (2 wheels) Transfers: Bed to chair/wheelchair/BSC, Sit to/from Stand Sit to Stand: Min assist, Mod assist   Step pivot transfers: Mod assist       General transfer comment: unsteady labored movement    Ambulation/Gait Ambulation/Gait assistance: Mod assist Gait Distance (Feet): 10 Feet Assistive device: Rolling walker (2 wheels) Gait Pattern/deviations: Decreased step length - right, Decreased step length - left, Knee flexed in stance - left, Antalgic, Decreased stance time - left Gait velocity: decreased     General Gait Details: slightly increased tolerance for weighbearing on LLE and limited to a few side steps and steps forward/backwards before requesting to sit due to fatigue, left hip pain   Stairs             Wheelchair Mobility     Tilt Bed    Modified Rankin (Stroke Patients Only)       Balance Overall balance assessment: Needs assistance Sitting-balance support: Feet supported, No upper extremity supported Sitting balance-Leahy Scale: Good Sitting balance - Comments: seated at EOB   Standing balance support: Reliant on assistive device for balance, During functional activity, Bilateral upper extremity supported Standing balance-Leahy Scale: Poor Standing balance comment: fair/poor using RW                            Cognition Arousal: Alert Behavior During Therapy: WFL for tasks assessed/performed Overall Cognitive Status: Within Functional Limits for tasks assessed  Exercises General Exercises - Lower Extremity Long Arc Quad: Seated, Strengthening, Both, 10 reps, AAROM Hip Flexion/Marching: Seated, AAROM, Strengthening, Both, 10 reps Toe Raises: Seated, AROM, Strengthening, Both, 10 reps Heel Raises: Seated, AROM, Strengthening, Both, 10 reps    General Comments        Pertinent  Vitals/Pain Pain Assessment Pain Assessment: Faces Faces Pain Scale: Hurts even more Pain Location: left hip Pain Descriptors / Indicators: Grimacing, Sharp, Guarding, Sore Pain Intervention(s): Limited activity within patient's tolerance, Monitored during session, Repositioned    Home Living                          Prior Function            PT Goals (current goals can now be found in the care plan section) Acute Rehab PT Goals Patient Stated Goal: return home after rehab PT Goal Formulation: With patient Time For Goal Achievement: 01/05/23 Potential to Achieve Goals: Good Progress towards PT goals: Progressing toward goals    Frequency    Min 3X/week      PT Plan      Co-evaluation              AM-PAC PT "6 Clicks" Mobility   Outcome Measure  Help needed turning from your back to your side while in a flat bed without using bedrails?: A Lot Help needed moving from lying on your back to sitting on the side of a flat bed without using bedrails?: A Lot Help needed moving to and from a bed to a chair (including a wheelchair)?: A Lot Help needed standing up from a chair using your arms (e.g., wheelchair or bedside chair)?: A Lot Help needed to walk in hospital room?: A Lot Help needed climbing 3-5 steps with a railing? : A Lot 6 Click Score: 12    End of Session   Activity Tolerance: Patient tolerated treatment well;Patient limited by fatigue Patient left: in chair;with call bell/phone within reach Nurse Communication: Mobility status PT Visit Diagnosis: Unsteadiness on feet (R26.81);Other abnormalities of gait and mobility (R26.89);Muscle weakness (generalized) (M62.81)     Time: 1610-9604 PT Time Calculation (min) (ACUTE ONLY): 26 min  Charges:    $Therapeutic Exercise: 8-22 mins $Therapeutic Activity: 8-22 mins PT General Charges $$ ACUTE PT VISIT: 1 Visit                     3:37 PM, 12/24/22 Ocie Bob, MPT Physical Therapist with  [facility] 336 [phone] office [phone] mobile phone

## 2022-12-24 NOTE — TOC Progression Note (Addendum)
Transition of Care [facility]) - Progression Note    Patient Details  Name: Debra Henderson MRN: [id number] Date of Birth: 1945/04/17  [facility] CM/SW Contact  Villa Herb, [place] Phone Number: 12/24/2022, 9:48 AM  Clinical Narrative:    Patients insurance auth for SNF is pending at this time. [facility] admissions states they will have a private room for pt tomorrow if we hear back from INS. CSW reached out to [facility] [employer] requesting update on auth, state it is under nursing review. Pt updated on plan. RN updated pt cannot D/C today even though D/C has been completed by MD. TOC to follow.   Addendum 3pm: CSW updated by [facility] that pts insurance [employer] has been approved. EMS approval is [phone] and SNF approval is [phone]. CSW to updated Destiny with facility. TOC to follow.   Expected Discharge Plan: Skilled Nursing Facility Barriers to Discharge: Insurance Authorization  Expected Discharge Plan and Services In-house Referral: Clinical Social Work Discharge Planning Services: CM Consult Post Acute Care Choice: Skilled Nursing Facility Living arrangements for the past 2 months: Single Family Home Expected Discharge Date: 12/24/22                                     Social Determinants of Health (SDOH) Interventions SDOH Screenings   Food Insecurity: No Food Insecurity (12/20/2022)  Housing: Low Risk  (12/20/2022)  Transportation Needs: No Transportation Needs (12/20/2022)  Utilities: Not At Risk (12/20/2022)  Social Connections: Unknown (06/04/2021)   Received from [facility], [facility]  Tobacco Use: Medium Risk (12/20/2022)    Readmission Risk Interventions    12/22/2022   11:24 AM  Readmission Risk Prevention Plan  Medication Screening Complete  Transportation Screening Complete

## 2022-12-25 DIAGNOSIS — S72145A Nondisplaced intertrochanteric fracture of left femur, initial encounter for closed fracture: Secondary | ICD-10-CM | POA: Diagnosis not present

## 2022-12-25 MED ORDER — ASPIRIN 81 MG PO TBEC
81.0000 mg | DELAYED_RELEASE_TABLET | Freq: Every day | ORAL | Status: DC
  Administered 2022-12-25: 81 mg via ORAL
  Filled 2022-12-25: qty 1

## 2022-12-25 MED ORDER — IRBESARTAN 150 MG PO TABS
150.0000 mg | ORAL_TABLET | Freq: Every day | ORAL | Status: DC
  Administered 2022-12-25: 150 mg via ORAL
  Filled 2022-12-25: qty 1

## 2022-12-25 MED ORDER — PRAVASTATIN SODIUM 10 MG PO TABS
10.0000 mg | ORAL_TABLET | Freq: Every day | ORAL | Status: DC
  Administered 2022-12-25: 10 mg via ORAL
  Filled 2022-12-25 (×3): qty 1

## 2022-12-25 MED ORDER — SODIUM CHLORIDE 1 G PO TABS
1.0000 g | ORAL_TABLET | Freq: Two times a day (BID) | ORAL | Status: DC
  Administered 2022-12-25 (×2): 1 g via ORAL
  Filled 2022-12-25 (×2): qty 1

## 2022-12-25 NOTE — Plan of Care (Signed)
  Problem: Activity: Goal: Ability to ambulate and perform ADLs will improve Outcome: Progressing   Problem: Clinical Measurements: Goal: Postoperative complications will be avoided or minimized Outcome: Progressing   Problem: Pain Management: Goal: Pain level will decrease Outcome: Progressing   

## 2022-12-25 NOTE — Progress Notes (Signed)
PROGRESS NOTE  Debra Henderson, is a 77 y.o. female, DOB - Oct 25, 1945, UJW:[id number]  Admit date - 12/20/2022   Admitting Physician Frankey Shown, DO  Outpatient Primary MD for the patient is [name], MD  LOS - 5  Chief Complaint  Patient presents with   Fall       Brief Narrative:  77 y.o. female with medical history significant of hypertension, hyperlipidemia, PAD admitted on 12/20/2022 with left hip fracture after mechanical fall at home  Likely stable pending discharge to SNF     Subjective: Debra Henderson and examined this morning, stable no acute distress No issues overnight-        -Assessment and Plan: 1)LT Hip Fx--- s/p mechanical fall at home while walking down the steps -Orthopedic consult appreciated -On 12/21/2022 after allowing for Plavix washout pt underwent ORIF with IM nail  -As needed opiates for pain control Postoperative plan Weightbearing as tolerated Staples if present can be removed postop day 12-14 Anticoagulation for 28 days--give Xarelto 10 mg for DVT prophylaxis while in the hospital, anticipate discharge on aspirin 81 daily for 1 month (also continue PTA Plavix indefinitely)--- anticipate she will be more active and mobile at SNF facility Follow-up visit at 4 weeks for x-rays and then x-rays at 6 weeks and 12 weeks -Further management by orthopedic team  2)HTN-hold AVapro -Continue with IV hydralazine as needed elevated BP  3) COPD --no further tobacco use -No exacerbation -Incruse Ellipta as ordered -As needed albuterol neb  4) acute hypoxic respiratory failure--suspect underlying COPD (patient is a smoker) -Currently requiring 2 L of oxygen via nasal cannula --Started Incruse Ellipta -As needed albuterol nebs -Incentive spirometry as ordered 12/23/22 Attempted to wean off O2 continues to require 2 L of oxygen via nasal cannula at this time -Anticipate she will be weaned off O2 prior to discharge to SNF rehab -Repeat  chest x-ray on 12/24/2022 if hypoxia persist, patient was not on O2 prior to admission  5)PAD--c/n  Plavix and pravastatin on 12/23/2022  6) leukocytosis--- this is reactive in the setting of hip fracture and surgery -Repeat CBC -Chest x-ray as above #4  7) chronic hyponatremia----sodium is currently 129 which is not far from patient's baseline -Avoid excessive free water -Added sodium supplements  8) acute on chronic anemia -Slight drop in H&H noted, Xarelto discontinued Continue with aspirin and Plavix      Status is: Inpatient   Disposition: The patient is from: Home              Anticipated d/c is to: SNF              Anticipated d/c date is: 1 day              Patient currently is medically stable to d/c. Barriers: -patient is medically ready/medically stable for transfer to  SNF rehab when SNF bed can be obtained and insurance authorization is available  Code Status :  -  Code Status: Full Code   Family Communication:    NA (patient is alert, awake and coherent)   DVT Prophylaxis  :   - SCDs  SCDs Start: 12/20/22 0607  Lab Results  Component Value Date   PLT 284 12/24/2022   Inpatient Medications  Scheduled Meds:  aspirin EC  81 mg Oral Daily   clopidogrel  75 mg Oral Daily   irbesartan  150 mg Oral Daily   methocarbamol  500 mg Oral TID   nicotine  14 mg Transdermal Daily  pravastatin  10 mg Oral Daily   sodium chloride  1 g Oral BID WC   umeclidinium bromide  1 puff Inhalation Daily   Continuous Infusions:   PRN Meds:.acetaminophen **OR** acetaminophen, albuterol, hydrALAZINE, HYDROmorphone (DILAUDID) injection, ondansetron **OR** ondansetron (ZOFRAN) IV, oxyCODONE   Anti-infectives (From admission, onward)    Start     Dose/Rate Route Frequency Ordered Stop   12/21/22 1100  ceFAZolin (ANCEF) IVPB 2g/100 mL premix  Status:  Discontinued        2 g 200 mL/hr over 30 Minutes Intravenous On call to O.R. 12/21/22 1048 12/21/22 1449   12/21/22 1028   ceFAZolin (ANCEF) 2-4 GM/100ML-% IVPB       Note to Pharmacy: Daphane Shepherd H: cabinet override      12/21/22 1028 12/21/22 2244       - Attempted to wean off O2 continues to require 2 L of oxygen via nasal cannula at this time  Objective: Vitals:   12/24/22 1450 12/24/22 2223 12/25/22 0552 12/25/22 0854  BP: 132/65 139/66 (!) 140/67   Pulse: 100     Resp: 17 18 18    Temp: 98.6 F (37 C)  98.5 F (36.9 C)   TempSrc: Oral Oral    SpO2: 95% 95% 100% 97%  Weight:      Height:        Intake/Output Summary (Last 24 hours) at 12/25/2022 1012 Last data filed at 12/25/2022 0921 Gross per 24 hour  Intake 840 ml  Output 500 ml  Net 340 ml   Filed Weights   12/20/22 0136 12/20/22 0624  Weight: 47.2 kg 47.3 kg   Physical Exam      General:  AAO x 3,  cooperative, no distress;   HEENT:  Normocephalic, PERRL, otherwise with in Normal limits   Neuro:  CNII-XII intact. , normal motor and sensation, reflexes intact   Lungs:   Clear to auscultation BL, Respirations unlabored,  No wheezes / crackles  Cardio:    S1/S2, RRR, No murmure, No Rubs or Gallops   Abdomen:  Soft, non-tender, bowel sounds active all four quadrants, no guarding or peritoneal signs.  Muscular  skeletal:  Limited exam -global generalized weaknesses - in bed, able to move all 4 extremities,   2+ pulses,  symmetric, No pitting edema  Skin:  Dry, warm to touch, negative for any Rashes,   -left hip post op wound is clean dry and intact  Wounds: Please see nursing documentation        Data Reviewed: I have personally reviewed following labs and imaging studies  CBC: Recent Labs  Lab 12/20/22 0317 12/21/22 0420 12/22/22 0942 12/24/22 0414  WBC 5.7 8.9 12.2* 7.1  NEUTROABS 3.7  --   --   --   HGB 10.2* 9.2* 8.2* 7.6*  HCT 30.1* 27.6* 24.7* 23.4*  MCV 96.2 97.9 99.2 97.9  PLT 350 269 239 284   Basic Metabolic Panel: Recent Labs  Lab 12/20/22 0650 12/20/22 1218 12/20/22 1802 12/21/22 0420  12/22/22 0942 12/24/22 0414  NA  --  130* 130* 127* 129* 129*  K  --  4.2 3.8 3.7 4.0 3.7  CL  --  99 101 97* 99 97*  CO2  --  25 21* 22 21* 25  GLUCOSE  --  98 130* 107* 148* 103*  BUN  --  14 13 11 14 13   CREATININE  --  0.63 0.59 0.55 0.66 0.54  CALCIUM  --  8.3* 8.3* 8.4* 8.4* 8.4*  MG 2.1  --   --   --   --   --   PHOS 3.3  --   --   --   --   --    GFR: Estimated Creatinine Clearance: 43.6 mL/min (by C-G formula based on SCr of 0.54 mg/dL). Liver Function Tests: Recent Labs  Lab 12/20/22 0317 12/21/22 0420  AST 16 15  ALT 13 9  ALKPHOS 49 43  BILITOT 0.3 0.6  PROT 6.9 5.8*  ALBUMIN 3.7 2.9*   Scheduled Meds:  aspirin EC  81 mg Oral Daily   clopidogrel  75 mg Oral Daily   irbesartan  150 mg Oral Daily   methocarbamol  500 mg Oral TID   nicotine  14 mg Transdermal Daily   pravastatin  10 mg Oral Daily   sodium chloride  1 g Oral BID WC   umeclidinium bromide  1 puff Inhalation Daily   Continuous Infusions:   LOS: 5 days   Kendell Bane M.D on 12/25/2022 at 10:12 AM  Go to [email or web address] - for contact info  [facility] - Office  [phone]  If 7PM-7AM, please contact night-coverage [email or web address] 12/25/2022, 10:12 AM

## 2022-12-25 NOTE — Plan of Care (Signed)
  Problem: Elimination: Goal: Will not experience complications related to bowel motility Outcome: Progressing   Problem: Activity: Goal: Ability to ambulate and perform ADLs will improve 12/25/2022 0152 by Vilma Meckel, RN Outcome: Progressing 12/25/2022 0152 by Vilma Meckel, RN Outcome: Progressing   Problem: Clinical Measurements: Goal: Postoperative complications will be avoided or minimized 12/25/2022 0152 by Vilma Meckel, RN Outcome: Progressing 12/25/2022 0152 by Vilma Meckel, RN Outcome: Progressing   Problem: Self-Concept: Goal: Ability to maintain and perform role responsibilities to the fullest extent possible will improve Outcome: Progressing

## 2022-12-25 NOTE — TOC Transition Note (Signed)
Transition of Care [facility]) - CM/SW Discharge Note   Patient Details  Name: Debra Henderson MRN: [id number] Date of Birth: 1945-04-25  Transition of Care [facility]) CM/SW Contact:  Elliot Gault, LCSW Phone Number: 12/25/2022, 11:15 AM   Clinical Narrative:     Pt remains medically stable for dc today per MD. Updated Destiny at [facility] who states pt can admit today.  HIPAA compliant voicemail message left for pt's dtr. DC clinical sent electronically. RN to call report. EMS arranged.  No other TOC needs for dc.  Final next level of care: Skilled Nursing Facility Barriers to Discharge: Barriers Resolved   Patient Goals and CMS Choice CMS Medicare.gov Compare Post Acute Care list provided to:: Patient Choice offered to / list presented to : Patient  Discharge Placement                Patient chooses bed at: [facility] Patient to be transferred to facility by: EMS Name of family member notified: Vernona Rieger Patient and family notified of of transfer: 12/25/22  Discharge Plan and Services Additional resources added to the After Visit Summary for   In-house Referral: Clinical Social Work Discharge Planning Services: CM Consult Post Acute Care Choice: Skilled Nursing Facility                               Social Determinants of Health (SDOH) Interventions SDOH Screenings   Food Insecurity: No Food Insecurity (12/20/2022)  Housing: Low Risk  (12/20/2022)  Transportation Needs: No Transportation Needs (12/20/2022)  Utilities: Not At Risk (12/20/2022)  Social Connections: Unknown (06/04/2021)   Received from [facility]  Tobacco Use: Medium Risk (12/20/2022)     Readmission Risk Interventions    12/22/2022   11:24 AM  Readmission Risk Prevention Plan  Medication Screening Complete  Transportation Screening Complete

## 2022-12-26 DIAGNOSIS — E871 Hypo-osmolality and hyponatremia: Secondary | ICD-10-CM | POA: Diagnosis not present

## 2022-12-26 DIAGNOSIS — F17200 Nicotine dependence, unspecified, uncomplicated: Secondary | ICD-10-CM | POA: Diagnosis not present

## 2022-12-26 DIAGNOSIS — D72829 Elevated white blood cell count, unspecified: Secondary | ICD-10-CM | POA: Diagnosis not present

## 2022-12-26 DIAGNOSIS — D0511 Intraductal carcinoma in situ of right breast: Secondary | ICD-10-CM | POA: Diagnosis not present

## 2022-12-26 DIAGNOSIS — S7292XD Unspecified fracture of left femur, subsequent encounter for closed fracture with routine healing: Secondary | ICD-10-CM | POA: Diagnosis not present

## 2022-12-26 DIAGNOSIS — D0512 Intraductal carcinoma in situ of left breast: Secondary | ICD-10-CM | POA: Diagnosis not present

## 2022-12-26 DIAGNOSIS — N189 Chronic kidney disease, unspecified: Secondary | ICD-10-CM | POA: Diagnosis not present

## 2022-12-26 DIAGNOSIS — Z720 Tobacco use: Secondary | ICD-10-CM | POA: Diagnosis not present

## 2022-12-26 DIAGNOSIS — R531 Weakness: Secondary | ICD-10-CM | POA: Diagnosis not present

## 2022-12-26 DIAGNOSIS — J449 Chronic obstructive pulmonary disease, unspecified: Secondary | ICD-10-CM | POA: Diagnosis not present

## 2022-12-26 DIAGNOSIS — J9601 Acute respiratory failure with hypoxia: Secondary | ICD-10-CM | POA: Diagnosis not present

## 2022-12-26 DIAGNOSIS — K219 Gastro-esophageal reflux disease without esophagitis: Secondary | ICD-10-CM | POA: Diagnosis not present

## 2022-12-26 DIAGNOSIS — Z955 Presence of coronary angioplasty implant and graft: Secondary | ICD-10-CM | POA: Diagnosis not present

## 2022-12-26 DIAGNOSIS — I10 Essential (primary) hypertension: Secondary | ICD-10-CM | POA: Diagnosis not present

## 2022-12-26 DIAGNOSIS — I2510 Atherosclerotic heart disease of native coronary artery without angina pectoris: Secondary | ICD-10-CM | POA: Diagnosis not present

## 2022-12-26 DIAGNOSIS — E782 Mixed hyperlipidemia: Secondary | ICD-10-CM | POA: Diagnosis not present

## 2022-12-26 DIAGNOSIS — Z9181 History of falling: Secondary | ICD-10-CM | POA: Diagnosis not present

## 2022-12-26 DIAGNOSIS — I739 Peripheral vascular disease, unspecified: Secondary | ICD-10-CM | POA: Diagnosis not present

## 2022-12-26 DIAGNOSIS — E876 Hypokalemia: Secondary | ICD-10-CM | POA: Diagnosis not present

## 2022-12-26 DIAGNOSIS — J45909 Unspecified asthma, uncomplicated: Secondary | ICD-10-CM | POA: Diagnosis not present

## 2022-12-26 DIAGNOSIS — R2689 Other abnormalities of gait and mobility: Secondary | ICD-10-CM | POA: Diagnosis not present

## 2022-12-26 DIAGNOSIS — R41841 Cognitive communication deficit: Secondary | ICD-10-CM | POA: Diagnosis not present

## 2022-12-26 NOTE — Consult Note (Signed)
[facility] Liaison Note  12/26/2022  Debra Henderson 27-Feb-1945 [id number]  Location: RN Hospital Liaison screened the patient remotely at [facility].  Insurance: [employer]   [name] is a 77 y.o. female who is a Primary Care Patient of [name], MD-[facility]. The patient was screened for  day readmission hospitalization with noted low risk score for unplanned readmission risk with 1 IP in 6 months.  The patient was assessed for potential Care Management service needs for post hospital transition for care coordination. Review of patient's electronic medical record reveals patient was admitted Closed left Femoral fracture. Pt discharged to [facility] -SNF level of care. Facility will continue to address pt's needs.   [facility] Care Management/Population Health does not replace or interfere with any arrangements made by the Inpatient Transition of Care team.   For questions contact:   Elliot Cousin, RN, [facility] Liaison [place]   [facility], Population Health Office Hours MTWF  8:00 am-6:00 pm Direct Dial: [phone] mobile [phone] [Office toll free line] Office Hours are M-F 8:30 - 5 pm [name]

## 2022-12-27 DIAGNOSIS — K5900 Constipation, unspecified: Secondary | ICD-10-CM | POA: Insufficient documentation

## 2023-01-15 DIAGNOSIS — S72002A Fracture of unspecified part of neck of left femur, initial encounter for closed fracture: Secondary | ICD-10-CM | POA: Insufficient documentation

## 2023-01-16 DIAGNOSIS — F17200 Nicotine dependence, unspecified, uncomplicated: Secondary | ICD-10-CM | POA: Diagnosis not present

## 2023-01-16 DIAGNOSIS — E876 Hypokalemia: Secondary | ICD-10-CM | POA: Diagnosis not present

## 2023-01-16 DIAGNOSIS — I739 Peripheral vascular disease, unspecified: Secondary | ICD-10-CM | POA: Diagnosis not present

## 2023-01-16 DIAGNOSIS — I2510 Atherosclerotic heart disease of native coronary artery without angina pectoris: Secondary | ICD-10-CM | POA: Diagnosis not present

## 2023-01-19 ENCOUNTER — Ambulatory Visit (INDEPENDENT_AMBULATORY_CARE_PROVIDER_SITE_OTHER): Payer: PPO | Admitting: Orthopedic Surgery

## 2023-01-19 ENCOUNTER — Other Ambulatory Visit (INDEPENDENT_AMBULATORY_CARE_PROVIDER_SITE_OTHER): Payer: PPO

## 2023-01-19 DIAGNOSIS — S72002D Fracture of unspecified part of neck of left femur, subsequent encounter for closed fracture with routine healing: Secondary | ICD-10-CM | POA: Diagnosis not present

## 2023-01-19 NOTE — Progress Notes (Signed)
Chief Complaint  Patient presents with   Hip Injury    Fracture admit November 30 discharge December 5 surgery December 1   Postop day #29  Status post short gamma nail left hip  Went to rehab did well  Ambulatory with a cane driving  Comes in for first postop visit  Preoperatively on Plavix postoperatively started on aspirin however she is not taking the aspirin

## 2023-01-26 ENCOUNTER — Telehealth: Payer: Self-pay | Admitting: Orthopedic Surgery

## 2023-01-26 NOTE — Telephone Encounter (Signed)
 Dr. Areatha pt - spoke w/the pt, she had surgery 12/21/22, per the op note:  Follow-up visit at 4 weeks for x-rays and then x-rays at 6 weeks and 12 weeks.  On 12/09 appointments were given for 12/30, 02/02/23 and 03/16/23.  Then when the pt was seen on 01/19/23 she was told to return around 03/02/23.  Please confirm, do I need to cx the 02/02/23 and 03/16/23 appointments and then when she comes in on 03/02/23 we can schedule her f/u that day?  Please advise. [phone]

## 2023-01-28 DIAGNOSIS — E782 Mixed hyperlipidemia: Secondary | ICD-10-CM | POA: Diagnosis not present

## 2023-01-28 DIAGNOSIS — I700 Atherosclerosis of aorta: Secondary | ICD-10-CM | POA: Diagnosis not present

## 2023-01-28 DIAGNOSIS — K589 Irritable bowel syndrome without diarrhea: Secondary | ICD-10-CM | POA: Diagnosis not present

## 2023-01-28 DIAGNOSIS — I10 Essential (primary) hypertension: Secondary | ICD-10-CM | POA: Diagnosis not present

## 2023-01-28 DIAGNOSIS — I739 Peripheral vascular disease, unspecified: Secondary | ICD-10-CM | POA: Diagnosis not present

## 2023-01-28 DIAGNOSIS — I701 Atherosclerosis of renal artery: Secondary | ICD-10-CM | POA: Diagnosis not present

## 2023-01-28 DIAGNOSIS — Z681 Body mass index (BMI) 19 or less, adult: Secondary | ICD-10-CM | POA: Diagnosis not present

## 2023-02-02 ENCOUNTER — Encounter: Payer: PPO | Admitting: Orthopedic Surgery

## 2023-02-27 NOTE — Progress Notes (Signed)
   BP 90/60   Pulse 88   Ht 5' (1.524 m)   Wt 97 lb (44 kg)   BMI 18.94 kg/m   Body mass index is 18.94 kg/m.  Chief Complaint  Patient presents with   Hip Pain    Follow up left hip patient says its better  she is having pain between the ankle and the knee of the left leg     Encounter Diagnosis  Name Primary?   Closed fracture of left hip with routine healing, subsequent encounter IM nail 12/21/22 Yes    DOI/DOS/ Date: 1201/24  Improved

## 2023-03-02 ENCOUNTER — Ambulatory Visit (INDEPENDENT_AMBULATORY_CARE_PROVIDER_SITE_OTHER): Payer: PPO | Admitting: Orthopedic Surgery

## 2023-03-02 ENCOUNTER — Other Ambulatory Visit (INDEPENDENT_AMBULATORY_CARE_PROVIDER_SITE_OTHER): Payer: Self-pay

## 2023-03-02 ENCOUNTER — Encounter: Payer: Self-pay | Admitting: Orthopedic Surgery

## 2023-03-02 VITALS — BP 90/60 | HR 88 | Ht 60.0 in | Wt 97.0 lb

## 2023-03-02 DIAGNOSIS — S72002D Fracture of unspecified part of neck of left femur, subsequent encounter for closed fracture with routine healing: Secondary | ICD-10-CM

## 2023-03-02 NOTE — Progress Notes (Signed)
 Patient: Debra Henderson           Date of Birth: 1945-11-12           MRN: [id number] Visit Date: 03/02/2023 Requested by: Kathyleen Parkins, MD [address] [place],  [place] [address] PCP: Kathyleen Parkins, MD  78 year old female doing well after gamma nailing left hip for intertrochanteric fracture  DG HIP UNILAT WITH PELVIS 2-3 VIEWS LEFT Result Date: 03/02/2023 Imaging report left hip ORIF left hip with cephalic medullary nail December 21, 2022 Multiple views of the hip Short gamma nail left hip 2 screws distally as 1 missed the jig the second 1 was placed freehand Fracture has healed no hardware complications Impression stable implant status post nailing left proximal femur/intertrochanteric hip fracture      Chief Complaint  Patient presents with   Hip Pain    Follow up left hip patient says its better  she is having pain between the ankle and the knee of the left leg    Encounter Diagnosis  Name Primary?   Closed fracture of left hip with routine healing, subsequent encounter IM nail 12/21/22 Yes    No symptoms elicited on palpation and reexamination of the left lower extremity knee to ankle  May have some radicular type symptoms or L5 root irritation but nothing acute or concerning at this time  Allergies  Allergen Reactions   Tetracyclines & Related Nausea And Vomiting and Other (See Comments)    Vomiting blood   Vytorin [Ezetimibe-Simvastatin] Other (See Comments)    Per Dr Aleda Ammon note   Chicken Allergy Diarrhea, Rash and Other (See Comments)   Egg-Derived Products Diarrhea, Rash and Other (See Comments)   Erythromycin Rash   Lipitor [Atorvastatin ] Rash and Other (See Comments)    Myalgias    Penicillins Rash, Swelling and Other (See Comments)   Sulfa Antibiotics Swelling and Rash    BP 90/60   Pulse 88   Ht 5' (1.524 m)   Wt 97 lb (44 kg)   BMI 18.94 kg/m    Encounter Diagnosis  Name Primary?   Closed fracture of left hip with routine healing,  subsequent encounter IM nail 12/21/22 Yes    BP 90/60   Pulse 88   Ht 5' (1.524 m)   Wt 97 lb (44 kg)   BMI 18.94 kg/m   Body mass index is 18.94 kg/m.       Chief Complaint  Patient presents with   Hip Pain      Follow up left hip patient says its better  she is having pain between the ankle and the knee of the left leg           Encounter Diagnosis  Name Primary?   Closed fracture of left hip with routine healing, subsequent encounter IM nail 12/21/22 Yes      DOI/DOS/ Date: 1201/24   Improved

## 2023-03-16 ENCOUNTER — Encounter: Payer: PPO | Admitting: Orthopedic Surgery

## 2023-03-20 DIAGNOSIS — R790 Abnormal level of blood mineral: Secondary | ICD-10-CM | POA: Diagnosis not present

## 2023-04-10 ENCOUNTER — Encounter: Payer: Self-pay | Admitting: *Deleted

## 2023-04-14 ENCOUNTER — Encounter: Payer: Self-pay | Admitting: Cardiology

## 2023-04-14 ENCOUNTER — Ambulatory Visit: Payer: PPO | Attending: Cardiology | Admitting: Cardiology

## 2023-04-14 VITALS — BP 138/56 | HR 78 | Ht 60.0 in | Wt 99.0 lb

## 2023-04-14 DIAGNOSIS — E782 Mixed hyperlipidemia: Secondary | ICD-10-CM

## 2023-04-14 DIAGNOSIS — R011 Cardiac murmur, unspecified: Secondary | ICD-10-CM

## 2023-04-14 DIAGNOSIS — R55 Syncope and collapse: Secondary | ICD-10-CM | POA: Diagnosis not present

## 2023-04-14 NOTE — Progress Notes (Signed)
 Clinical Summary Debra Henderson is a 78 y.o.female seen today for follow up of the following medical problems   1. Syncope - 2-3 episodes  - first episode roughly 6 months ago - was standing at kitchen sink. Started trembling, next thing she knew was on the floor. Poor oral intake.  - next episode few months later. She was stepping on a stool, when she got to the 2nd step and had a fall. - occasional dizziness at times, last episode about 6 week ago - no palpitaitons.   - no recent episodes     2. PAD status post left renal artery stenting back in 2004 and restenting because of in-stent restenosis. She's also had left iliac stenting    - compliant with meds - denies any claudication symptoms   3. HTN  - she is compliant with meds   4. Hyperlipidemia -07/2022 TC 177 TG 97 HDL 69 LDL 91 - upcoming labs with pcp - she does not want higher of sttin.    5. Carotid bruit - 08/2020 carotid US [facility]: no significant stenosis  6. Hip fracture - admit 12/2022 with mechanical fall, had ORIF left hip   Husband passed recently.  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, ER/PR+   Cancer (HCC) 2014   breast cancer   Chronic kidney disease    renal artery stenosis   Claudication (HCC)    lower extremities   Complication of anesthesia    ' It does not work " I am difficult to put tpo sleep   Family history of anesthesia complication    my brother is difficult to put to sleep also   GERD (gastroesophageal reflux disease)    otc   History of renal stent    LEA DUPLEX, 03/16/2012 - LEFT EIA DISTAL/COMMON FEMORAL ARTERY-demonstrated occlusive disease   Hyperlipidemia    Hypertension    Incontinence    Peripheral vascular disease (HCC)    prior stenting of left iliac artery   Personal history of radiation therapy 2014   right breast   Personal history of radiation therapy 2019   left breast   Radiation  08/02/12-08/23/12   Right breast 42.72 Gy x 16 fx   Renal artery stenosis (HCC)    RENAL DOPPLER, 02/12/2011 - RIGHT RENAL ARTERY 60-99% diameter reduction, LEFT RENAL ARTERY AT STENT 60-99% diameter reduction     Allergies  Allergen Reactions   Tetracyclines & Related Nausea And Vomiting and Other (See Comments)    Vomiting blood   Vytorin [Ezetimibe-Simvastatin] Other (See Comments)    Per Dr Erlene Quan note   Chicken Allergy Diarrhea, Rash and Other (See Comments)   Egg-Derived Products Diarrhea, Rash and Other (See Comments)   Erythromycin Rash   Lipitor [Atorvastatin] Rash and Other (See Comments)    Myalgias    Penicillins Rash, Swelling and Other (See Comments)   Sulfa Antibiotics Swelling and Rash     Current Outpatient Medications  Medication Sig Dispense Refill   acetaminophen (TYLENOL) 325 MG tablet Take 2 tablets (650 mg total) by mouth every 6 (six) hours as needed for mild pain (pain score 1-3) (or Fever >/= 101). 30 tablet 0   aspirin EC 81 MG tablet Take 1 tablet (81 mg total) by mouth daily. Swallow whole. (Patient not taking: Reported on 01/19/2023) 150 tablet 2   cholecalciferol (VITAMIN D3) 25 MCG (1000 UNIT)  tablet Take 1,000 Units by mouth daily.     clopidogrel (PLAVIX) 75 MG tablet TAKE1 TABLET EVERY DAY  3   Cyanocobalamin (B-12) 1000 MCG SUBL Place 1,000 mcg under the tongue daily. Start 09/15/22     ferrous sulfate 325 (65 FE) MG EC tablet Take 325 mg by mouth 3 (three) times daily with meals.     nicotine (NICODERM CQ - DOSED IN MG/24 HOURS) 14 mg/24hr patch Place 1 patch (14 mg total) onto the skin daily. 28 patch 0   olmesartan-hydrochlorothiazide (BENICAR HCT) 20-12.5 MG tablet      omega-3 acid ethyl esters (LOVAZA) 1 G capsule Take 1 g by mouth 3 (three) times daily.     pravastatin (PRAVACHOL) 10 MG tablet Take 10 mg by mouth daily.     No current facility-administered medications for this visit.     Past Surgical History:  Procedure Laterality Date    ANGIOPLASTY ILLIAC ARTERY  02/24/2012   Dr Allyson Sabal  L iliac restent   ATHERECTOMY N/A 02/24/2012   Procedure: ATHERECTOMY;  Surgeon: Runell Gess, MD;  Location: [facility] CATH LAB;  Service: Cardiovascular;  Laterality: N/A;   BLADDER SUSPENSION  1980's   bladder tack     BREAST BIOPSY Right 05/2013   BREAST BIOPSY Right 02/13/2012   BREAST BIOPSY Left 12/25/2016   BREAST LUMPECTOMY Right 05/02/2012   BREAST LUMPECTOMY Left 02/06/2017   BREAST LUMPECTOMY WITH NEEDLE LOCALIZATION Right 04/22/2012   Procedure: RIGHT BREAST WIRE LOCALIZATION  LUMPECTOMY ;  Surgeon: Robyne Askew, MD;  Location: [facility] OR;  Service: General;  Laterality: Right;   BREAST LUMPECTOMY WITH RADIOACTIVE SEED LOCALIZATION Left 02/06/2017   Procedure: BREAST LUMPECTOMY WITH RADIOACTIVE SEED LOCALIZATION;  Surgeon: Griselda Miner, MD;  Location: [facility];  Service: General;  Laterality: Left;   fibroid breast     left breast-adenoma benign 1980's   ILIAC ARTERY STENT  11/21/19/02   left    INTRAMEDULLARY (IM) NAIL INTERTROCHANTERIC Left 12/21/2022   Procedure: INTRAMEDULLARY (IM) NAIL INTERTROCHANTERIC;  Surgeon: Vickki Hearing, MD;  Location: [facility] ORS;  Service: Orthopedics;  Laterality: Left;   NM MYOVIEW LTD  03/21/2010   normal myocardial perfusion study   PERIPHERAL VASCULAR CATHETERIZATION Left 02/24/2012   Common iliac artery, 8x38 iCast Stent, resulting in a reduction from 80% in-stent restenosis to 0% residual   PERIPHERAL VASCULAR CATHETERIZATION Left 08/04/2006   Renal 90% stenosis, 3.5x 13 drug-eluting stent resulting in a reduction of 90% in-stent restenosis to 0% residual   PERIPHERAL VASCULAR CATHETERIZATION Left 05/22/2005   Renal 80% in-stent stenosis, 5x15 Aviator resulting in a reduction of 80% in-stent restenosis to 0% residual   PERIPHERAL VASCULAR CATHETERIZATION Left 02/22/2002   Renal in-stent restenosis, 5x15 Guidant rapid-exchange balloon resulting in reduction of a 95% in-stent restenosis  to less than 20% residual   PERIPHERAL VASCULAR CATHETERIZATION Left 12/10/2000   95% renal stenosis, 6x41mm Genesis Aviator balloon/stent deployed at 10 atmospheres resulting in a reduction  of 95% stenosis to 0% residual; Common iliac artery stenosis, P12x4 mounted on a 7x2 Powerflex balloon resulting in a reduction of 80% to 0% residual   PERIPHERAL VASCULAR CATHETERIZATION Left 07/27/2000   95% renal stenosis, 7mm x 6cm Smart stent resulting in a reduction of 90-95%  to 0% residual   stent /kidney  12/10/00,08/04/06   2002 R renal artery, 2008 L renal   TUBAL LIGATION     1980's     Allergies  Allergen Reactions  Tetracyclines & Related Nausea And Vomiting and Other (See Comments)    Vomiting blood   Vytorin [Ezetimibe-Simvastatin] Other (See Comments)    Per Dr Erlene Quan note   Chicken Allergy Diarrhea, Rash and Other (See Comments)   Egg-Derived Products Diarrhea, Rash and Other (See Comments)   Erythromycin Rash   Lipitor [Atorvastatin] Rash and Other (See Comments)    Myalgias    Penicillins Rash, Swelling and Other (See Comments)   Sulfa Antibiotics Swelling and Rash      Family History  Problem Relation Age of Onset   Cancer Mother        colon   COPD Mother    Cancer Father        brain   Stroke Sister    Heart attack Sister    Breast cancer Neg Hx      Social History Debra Henderson reports that she quit smoking about 35 years ago. Her smoking use included cigarettes. She has never used smokeless tobacco. Debra Henderson reports no history of alcohol use.     Physical Examination Today's Vitals   04/14/23 1354  BP: (!) 138/56  Pulse: 78  SpO2: 97%  Weight: 99 lb (44.9 kg)  Height: 5' (1.524 m)   Body mass index is 19.33 kg/m.  Gen: resting comfortably, no acute distress HEENT: no scleral icterus, pupils equal round and reactive, no palptable cervical adenopathy,  CV: RRR, no mrg, no jvd Resp: Clear to auscultation bilaterally GI: abdomen is soft,  non-tender, non-distended, normal bowel sounds, no hepatosplenomegaly MSK: extremities are warm, no edema.  Skin: warm, no rash Neuro:  no focal deficits Psych: appropriate affect      Assessment and Plan  1. Syncope - likely orthostatic syncope. Prior orthostatics were borderline with SBP dropping 18 points.  -working on regular hydration, no recent episodes - continue to monitor   2.HLD - LDL above goal given her PAD history, she declines increasing her pravastatin  3. Heart murmur - she declined echocardiogram            Antoine Poche, M.D.

## 2023-04-14 NOTE — Patient Instructions (Signed)
 Medication Instructions:  Continue all current medications.   Labwork: none  Testing/Procedures: none  Follow-Up: 6 months   Any Other Special Instructions Will Be Listed Below (If Applicable).   If you need a refill on your cardiac medications before your next appointment, please call your pharmacy.

## 2023-04-21 DIAGNOSIS — I739 Peripheral vascular disease, unspecified: Secondary | ICD-10-CM | POA: Diagnosis not present

## 2023-04-21 DIAGNOSIS — Z1331 Encounter for screening for depression: Secondary | ICD-10-CM | POA: Diagnosis not present

## 2023-04-21 DIAGNOSIS — Z9229 Personal history of other drug therapy: Secondary | ICD-10-CM | POA: Diagnosis not present

## 2023-04-21 DIAGNOSIS — Z681 Body mass index (BMI) 19 or less, adult: Secondary | ICD-10-CM | POA: Diagnosis not present

## 2023-04-21 DIAGNOSIS — R790 Abnormal level of blood mineral: Secondary | ICD-10-CM | POA: Diagnosis not present

## 2023-04-21 DIAGNOSIS — D649 Anemia, unspecified: Secondary | ICD-10-CM | POA: Diagnosis not present

## 2023-04-21 DIAGNOSIS — K589 Irritable bowel syndrome without diarrhea: Secondary | ICD-10-CM | POA: Diagnosis not present

## 2023-04-21 DIAGNOSIS — I700 Atherosclerosis of aorta: Secondary | ICD-10-CM | POA: Diagnosis not present

## 2023-04-21 DIAGNOSIS — I701 Atherosclerosis of renal artery: Secondary | ICD-10-CM | POA: Diagnosis not present

## 2023-04-21 DIAGNOSIS — Z0001 Encounter for general adult medical examination with abnormal findings: Secondary | ICD-10-CM | POA: Diagnosis not present

## 2023-04-21 DIAGNOSIS — M810 Age-related osteoporosis without current pathological fracture: Secondary | ICD-10-CM | POA: Diagnosis not present

## 2023-04-21 DIAGNOSIS — E782 Mixed hyperlipidemia: Secondary | ICD-10-CM | POA: Diagnosis not present

## 2023-04-21 DIAGNOSIS — I10 Essential (primary) hypertension: Secondary | ICD-10-CM | POA: Diagnosis not present

## 2023-04-21 DIAGNOSIS — R55 Syncope and collapse: Secondary | ICD-10-CM | POA: Diagnosis not present

## 2023-04-21 DIAGNOSIS — I951 Orthostatic hypotension: Secondary | ICD-10-CM | POA: Diagnosis not present

## 2023-05-15 ENCOUNTER — Other Ambulatory Visit: Payer: Self-pay | Admitting: Cardiology

## 2023-07-23 ENCOUNTER — Other Ambulatory Visit (HOSPITAL_COMMUNITY): Payer: Self-pay | Admitting: Internal Medicine

## 2023-07-23 ENCOUNTER — Ambulatory Visit (HOSPITAL_COMMUNITY)
Admission: RE | Admit: 2023-07-23 | Discharge: 2023-07-23 | Disposition: A | Discharge status: Home / Self Care | Source: Ambulatory Visit | Attending: Internal Medicine | Admitting: Internal Medicine

## 2023-07-23 DIAGNOSIS — M25531 Pain in right wrist: Secondary | ICD-10-CM

## 2023-07-23 DIAGNOSIS — I701 Atherosclerosis of renal artery: Secondary | ICD-10-CM | POA: Diagnosis not present

## 2023-07-23 DIAGNOSIS — I739 Peripheral vascular disease, unspecified: Secondary | ICD-10-CM | POA: Diagnosis not present

## 2023-07-23 DIAGNOSIS — E2839 Other primary ovarian failure: Secondary | ICD-10-CM | POA: Diagnosis not present

## 2023-07-23 DIAGNOSIS — I872 Venous insufficiency (chronic) (peripheral): Secondary | ICD-10-CM | POA: Diagnosis not present

## 2023-07-23 DIAGNOSIS — Z9229 Personal history of other drug therapy: Secondary | ICD-10-CM | POA: Diagnosis not present

## 2023-07-23 DIAGNOSIS — Z681 Body mass index (BMI) 19 or less, adult: Secondary | ICD-10-CM | POA: Diagnosis not present

## 2023-07-23 DIAGNOSIS — I700 Atherosclerosis of aorta: Secondary | ICD-10-CM | POA: Diagnosis not present

## 2023-07-23 DIAGNOSIS — R609 Edema, unspecified: Secondary | ICD-10-CM | POA: Diagnosis not present

## 2023-07-23 DIAGNOSIS — M1811 Unilateral primary osteoarthritis of first carpometacarpal joint, right hand: Secondary | ICD-10-CM | POA: Diagnosis not present

## 2023-07-23 DIAGNOSIS — Z0001 Encounter for general adult medical examination with abnormal findings: Secondary | ICD-10-CM | POA: Diagnosis not present

## 2023-07-23 DIAGNOSIS — I10 Essential (primary) hypertension: Secondary | ICD-10-CM | POA: Diagnosis not present

## 2023-07-23 DIAGNOSIS — M8580 Other specified disorders of bone density and structure, unspecified site: Secondary | ICD-10-CM | POA: Diagnosis not present

## 2023-08-04 ENCOUNTER — Ambulatory Visit (INDEPENDENT_AMBULATORY_CARE_PROVIDER_SITE_OTHER): Admitting: Gastroenterology

## 2023-08-05 ENCOUNTER — Ambulatory Visit (INDEPENDENT_AMBULATORY_CARE_PROVIDER_SITE_OTHER): Admitting: Gastroenterology

## 2023-08-06 ENCOUNTER — Ambulatory Visit (INDEPENDENT_AMBULATORY_CARE_PROVIDER_SITE_OTHER): Admitting: Gastroenterology

## 2023-08-25 ENCOUNTER — Encounter: Payer: Self-pay | Admitting: Adult Health

## 2023-08-25 ENCOUNTER — Ambulatory Visit (INDEPENDENT_AMBULATORY_CARE_PROVIDER_SITE_OTHER): Admitting: Gastroenterology

## 2023-08-25 ENCOUNTER — Encounter (INDEPENDENT_AMBULATORY_CARE_PROVIDER_SITE_OTHER): Payer: Self-pay | Admitting: Gastroenterology

## 2023-08-25 VITALS — BP 104/66 | HR 96 | Temp 98.3°F | Ht <= 58 in | Wt 92.0 lb

## 2023-08-25 DIAGNOSIS — D500 Iron deficiency anemia secondary to blood loss (chronic): Secondary | ICD-10-CM | POA: Insufficient documentation

## 2023-08-25 DIAGNOSIS — D509 Iron deficiency anemia, unspecified: Secondary | ICD-10-CM | POA: Diagnosis not present

## 2023-08-25 DIAGNOSIS — Z860101 Personal history of adenomatous and serrated colon polyps: Secondary | ICD-10-CM | POA: Diagnosis not present

## 2023-08-25 DIAGNOSIS — Z860100 Personal history of colon polyps, unspecified: Secondary | ICD-10-CM | POA: Insufficient documentation

## 2023-08-25 NOTE — Patient Instructions (Signed)
 It was very nice to meet you today, as dicussed with will plan for the following :  1) see the blood doctors

## 2023-08-25 NOTE — Progress Notes (Signed)
 Jett Fukuda Faizan Keiandre Cygan , M.D. Gastroenterology & Hepatology [facility] Gastroenterology [address] [place], [place] [address] Primary Care Physician: Bertell Satterfield, MD [address] [place] [address]  Chief Complaint: Macrocytic anemia  History of Present Illness: Debra Henderson is a 78 y.o. female with hypertension, hyperlipidemia, peripheral vascular disease on Plavix  who presents for evaluation of iron deficiency anemia  Patient does not have any active GI complaints . Patient was recently started on iron tablets The patient denies having any nausea, vomiting, fever, chills, hematochezia, melena, hematemesis, abdominal distention, abdominal pain, diarrhea, jaundice, pruritus  Last hemoglobin 10.2 MCV 102 platelet 458 Normal folate and vitamin B12 levels  Last ZHI:wnwz Last Colonoscopy:2019  - The perianal and digital rectal examinations were normal. - Scattered medium- mouthed diverticula were found in the sigmoid colon and ascending colon. - A 5 mm polyp was found in the cecum. The polyp was sessile. The polyp was removed with a cold snare. Resection and retrieval were complete. - A single small angiodysplastic lesion was found in the ascending colon. - Three sessile polyps were found in the transverse colon. The polyps were 4 to 10 mm in size. These polyps were removed with a cold snare. Resection and retrieval were complete. - A 8 mm polyp was found in the sigmoid colon. The polyp was sessile. The polyp was removed with a cold snare. Resection and retrieval were complete. - An area of mucosa was found in the sigmoid colon - protuberant slightly inflamed adjacent to a diverticulum. Suspect inflammatory changes related to diverticulosis but biopsies were taken with a cold forceps for histology to ensure no adenomatous change. - Internal hemorrhoids were found during retroflexion. The hemorrhoids were large.  - The colon was tortuous which  prolonged the procedure. the patient did not retained air well in the left colon. - The exam was otherwise without abnormality.  1. Surgical [P], sigmoid, transverse and cecum, polyp (5) - TUBULAR ADENOMA(S). - HIGH GRADE DYSPLASIA IS NOT IDENTIFIED. 2. Surgical [P], sigmoid polyp - BENIGN POLYPOID COLORECTAL MUCOSA. - THERE IS NO EVIDENCE OF MALIGNANCY. 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, ER/PR+   Cancer (HCC) 2014   breast cancer   Chronic kidney disease    renal artery stenosis   Claudication (HCC)    lower extremities   Complication of anesthesia    ' It does not work  I am difficult to put tpo sleep   Family history of anesthesia complication    my brother is difficult to put to sleep also   GERD (gastroesophageal reflux disease)    otc   History of renal stent    LEA DUPLEX, 03/16/2012 - LEFT EIA DISTAL/COMMON FEMORAL ARTERY-demonstrated occlusive disease   Hyperlipidemia    Hypertension    Incontinence    Peripheral vascular disease (HCC)    prior stenting of left iliac artery   Personal history of radiation therapy 2014   right breast   Personal history of radiation therapy 2019   left breast   Radiation 08/02/12-08/23/12   Right breast 42.72 Gy x 16 fx   Renal artery stenosis (HCC)    RENAL DOPPLER, 02/12/2011 - RIGHT RENAL ARTERY 60-99% diameter reduction, LEFT RENAL ARTERY  AT STENT 60-99% diameter reduction    Past Surgical History: Past Surgical History:  Procedure Laterality Date   ANGIOPLASTY ILLIAC ARTERY  02/24/2012   Dr Court  L iliac restent   ATHERECTOMY N/A 02/24/2012   Procedure: ATHERECTOMY;  Surgeon: Dorn JINNY Court, MD;  Location: [facility] CATH LAB;  Service: Cardiovascular;  Laterality: N/A;   BLADDER  SUSPENSION  1980's   bladder tack     BREAST BIOPSY Right 05/2013   BREAST BIOPSY Right 02/13/2012   BREAST BIOPSY Left 12/25/2016   BREAST LUMPECTOMY Right 05/02/2012   BREAST LUMPECTOMY Left 02/06/2017   BREAST LUMPECTOMY WITH NEEDLE LOCALIZATION Right 04/22/2012   Procedure: RIGHT BREAST WIRE LOCALIZATION  LUMPECTOMY ;  Surgeon: Deward GORMAN Curvin DOUGLAS, MD;  Location: [facility] OR;  Service: General;  Laterality: Right;   BREAST LUMPECTOMY WITH RADIOACTIVE SEED LOCALIZATION Left 02/06/2017   Procedure: BREAST LUMPECTOMY WITH RADIOACTIVE SEED LOCALIZATION;  Surgeon: Curvin Deward DOUGLAS, MD;  Location: [facility];  Service: General;  Laterality: Left;   COLONOSCOPY     fibroid breast     left breast-adenoma benign 1980's   ILIAC ARTERY STENT  11/21/19/02   left    INTRAMEDULLARY (IM) NAIL INTERTROCHANTERIC Left 12/21/2022   Procedure: INTRAMEDULLARY (IM) NAIL INTERTROCHANTERIC;  Surgeon: Margrette Taft BRAVO, MD;  Location: [facility] ORS;  Service: Orthopedics;  Laterality: Left;   NM MYOVIEW  LTD  03/21/2010   normal myocardial perfusion study   PERIPHERAL VASCULAR CATHETERIZATION Left 02/24/2012   Common iliac artery, 8x38 iCast Stent, resulting in a reduction from 80% in-stent restenosis to 0% residual   PERIPHERAL VASCULAR CATHETERIZATION Left 08/04/2006   Renal 90% stenosis, 3.5x 13 drug-eluting stent resulting in a reduction of 90% in-stent restenosis to 0% residual   PERIPHERAL VASCULAR CATHETERIZATION Left 05/22/2005   Renal 80% in-stent stenosis, 5x15 Aviator resulting in a reduction of 80% in-stent restenosis to 0% residual   PERIPHERAL VASCULAR CATHETERIZATION Left 02/22/2002   Renal in-stent restenosis, 5x15 Guidant rapid-exchange balloon resulting in reduction of a 95% in-stent restenosis to less than 20% residual   PERIPHERAL VASCULAR CATHETERIZATION Left 12/10/2000   95% renal stenosis, 6x87mm Genesis Aviator balloon/stent deployed at 10 atmospheres resulting in a reduction  of 95%  stenosis to 0% residual; Common iliac artery stenosis, P12x4 mounted on a 7x2 Powerflex balloon resulting in a reduction of 80% to 0% residual   PERIPHERAL VASCULAR CATHETERIZATION Left 07/27/2000   95% renal stenosis, 7mm x 6cm Smart stent resulting in a reduction of 90-95%  to 0% residual   stent /kidney  12/10/00,08/04/06   2002 R renal artery, 2008 L renal   TUBAL LIGATION     1980's    Family History: Family History  Problem Relation Age of Onset   Cancer Mother        colon   COPD Mother    Cancer Father        brain   Stroke Sister    Heart attack Sister    Breast cancer Neg Hx     Social History: Social History   Tobacco Use  Smoking Status Former   Current packs/day: 0.00   Types: Cigarettes   Quit date: 02/24/1988   Years since quitting: 35.5  Smokeless Tobacco Never  Tobacco Comments   quit 1990   Social History   Substance and Sexual Activity  Alcohol Use No   Social History   Substance and Sexual Activity  Drug Use No    Allergies: Allergies  Allergen Reactions  Tetracyclines & Related Nausea And Vomiting and Other (See Comments)    Vomiting blood   Vytorin [Ezetimibe-Simvastatin] Other (See Comments)    Per Dr JINNY Lesches note   Chicken Allergy Diarrhea, Rash and Other (See Comments)   Egg-Derived Products Diarrhea, Rash and Other (See Comments)   Erythromycin Rash   Lipitor [Atorvastatin ] Rash and Other (See Comments)    Myalgias    Penicillins Rash, Swelling and Other (See Comments)   Sulfa Antibiotics Swelling and Rash    Medications: Current Outpatient Medications  Medication Sig Dispense Refill   cholecalciferol (VITAMIN D3) 25 MCG (1000 UNIT) tablet Take 1,000 Units by mouth daily.     clopidogrel  (PLAVIX ) 75 MG tablet TAKE1 TABLET EVERY DAY  3   Cyanocobalamin  (B-12) 1000 MCG SUBL Place 1,000 mcg under the tongue daily. Start 09/15/22     ferrous sulfate 325 (65 FE) MG tablet Take 325 mg by mouth 3 (three) times daily.      olmesartan -hydrochlorothiazide  (BENICAR  HCT) 20-12.5 MG tablet TAKE 1 TABLET BY MOUTH EVERY DAY 90 tablet 2   omega-3 acid ethyl esters (LOVAZA ) 1 G capsule Take 1 g by mouth 3 (three) times daily.     pravastatin  (PRAVACHOL ) 10 MG tablet Take 10 mg by mouth daily.     Vitamin D, Ergocalciferol, (DRISDOL) 1.25 MG (50000 UNIT) CAPS capsule Take 50,000 Units by mouth once a week.     acetaminophen  (TYLENOL ) 325 MG tablet Take 2 tablets (650 mg total) by mouth every 6 (six) hours as needed for mild pain (pain score 1-3) (or Fever >/= 101). (Patient not taking: Reported on 08/25/2023) 30 tablet 0   No current facility-administered medications for this visit.    Review of Systems: GENERAL: negative for malaise, night sweats HEENT: No changes in hearing or vision, no nose bleeds or other nasal problems. NECK: Negative for lumps, goiter, pain and significant neck swelling RESPIRATORY: Negative for cough, wheezing CARDIOVASCULAR: Negative for chest pain, leg swelling, palpitations, orthopnea GI: SEE HPI MUSCULOSKELETAL: Negative for joint pain or swelling, back pain, and muscle pain. SKIN: Negative for lesions, rash HEMATOLOGY Negative for prolonged bleeding, bruising easily, and swollen nodes. ENDOCRINE: Negative for cold or heat intolerance, polyuria, polydipsia and goiter. NEURO: negative for tremor, gait imbalance, syncope and seizures. The remainder of the review of systems is noncontributory.   Physical Exam: BP 104/66   Pulse 96   Temp 98.3 F (36.8 C)   Ht 4' 10 (1.473 m)   Wt 92 lb (41.7 kg)   BMI 19.23 kg/m  GENERAL: The patient is AO x3, in no acute distress. HEENT: Head is normocephalic and atraumatic. EOMI are intact. Mouth is well hydrated and without lesions. NECK: Supple. No masses LUNGS: Clear to auscultation. No presence of rhonchi/wheezing/rales. Adequate chest expansion HEART: RRR, normal s1 and s2. ABDOMEN: Soft, nontender, no guarding, no peritoneal signs, and  nondistended. BS +. No masses.  Imaging/Labs: as above     Latest Ref Rng & Units 12/24/2022    4:14 AM 12/22/2022    9:42 AM 12/21/2022    4:20 AM  CBC  WBC 4.0 - 10.5 K/uL 7.1  12.2  8.9   Hemoglobin 12.0 - 15.0 g/dL 7.6  8.2  9.2   Hematocrit 36.0 - 46.0 % 23.4  24.7  27.6   Platelets 150 - 400 K/uL 284  239  269    Lab Results  Component Value Date   IRON 39 12/12/2022   TIBC 462 (H) 12/12/2022  FERRITIN 6 (L) 12/12/2022    I personally reviewed and interpreted the available labs, imaging and endoscopic files.  Impression and Plan:  Debra Henderson is a 78 y.o. female with hypertension, hyperlipidemia, peripheral vascular disease on Plavix  who presents for evaluation of iron deficiency anemia  #Iron deficiency anemia #Colon polyps   Last hemoglobin 10 MCV 102 iron saturation 38 previously had a ferritin of 26 November 2022  No overt GI bleed.  Last colonoscopy 2019 with tubular adenomas suggest repeat was 3 years  As per ACG guidelines , bidirectional endoscopy is recommended over iron replacement therapy only.  I offered patient appropriate next workup including upper endoscopy with small bowel biopsies and Colonoscopy , to ensure were not dealing with advanced polyp ulceration or malignancy  Patient reports that she is feeling well and would like to take iron supplementation for now.  She will think about it and will follow-up with us  in the clinic again to rediscuss endoscopic evaluation.  I discussed with patient with without upper endoscopy and colonoscopy unable to rule out lesions such as malignancy malignancy  All questions were answered.      Debra Barbero Faizan Meaghann Choo, MD Gastroenterology and Hepatology [facility] Gastroenterology   This chart has been completed using [facility] Dictation software, and while attempts have been made to ensure accuracy , certain words and phrases may not be transcribed as intended

## 2023-09-10 ENCOUNTER — Other Ambulatory Visit: Payer: Self-pay

## 2023-09-10 ENCOUNTER — Inpatient Hospital Stay: Payer: PPO

## 2023-09-10 ENCOUNTER — Inpatient Hospital Stay: Payer: PPO | Admitting: Adult Health

## 2023-09-10 DIAGNOSIS — D0512 Intraductal carcinoma in situ of left breast: Secondary | ICD-10-CM

## 2023-09-10 DIAGNOSIS — D649 Anemia, unspecified: Secondary | ICD-10-CM

## 2023-09-10 DIAGNOSIS — E538 Deficiency of other specified B group vitamins: Secondary | ICD-10-CM

## 2023-09-11 ENCOUNTER — Inpatient Hospital Stay: Attending: Internal Medicine

## 2023-09-11 ENCOUNTER — Inpatient Hospital Stay (HOSPITAL_BASED_OUTPATIENT_CLINIC_OR_DEPARTMENT_OTHER): Admitting: Hematology and Oncology

## 2023-09-11 ENCOUNTER — Inpatient Hospital Stay

## 2023-09-11 VITALS — BP 122/58 | HR 92 | Temp 98.8°F | Resp 15 | Wt 92.5 lb

## 2023-09-11 DIAGNOSIS — E538 Deficiency of other specified B group vitamins: Secondary | ICD-10-CM | POA: Insufficient documentation

## 2023-09-11 DIAGNOSIS — D649 Anemia, unspecified: Secondary | ICD-10-CM | POA: Diagnosis not present

## 2023-09-11 DIAGNOSIS — M810 Age-related osteoporosis without current pathological fracture: Secondary | ICD-10-CM | POA: Diagnosis not present

## 2023-09-11 DIAGNOSIS — D0512 Intraductal carcinoma in situ of left breast: Secondary | ICD-10-CM | POA: Diagnosis not present

## 2023-09-11 DIAGNOSIS — M818 Other osteoporosis without current pathological fracture: Secondary | ICD-10-CM

## 2023-09-11 LAB — CMP (CANCER CENTER ONLY)
ALT: 5 U/L (ref 0–44)
AST: 10 U/L — ABNORMAL LOW (ref 15–41)
Albumin: 4.3 g/dL (ref 3.5–5.0)
Alkaline Phosphatase: 72 U/L (ref 38–126)
Anion gap: 10 (ref 5–15)
BUN: 8 mg/dL (ref 8–23)
CO2: 31 mmol/L (ref 22–32)
Calcium: 9.4 mg/dL (ref 8.9–10.3)
Chloride: 97 mmol/L — ABNORMAL LOW (ref 98–111)
Creatinine: 0.57 mg/dL (ref 0.44–1.00)
GFR, Estimated: >60 mL/min (ref >60)
Glucose, Bld: 129 mg/dL — ABNORMAL HIGH (ref 70–99)
Potassium: 3 mmol/L — ABNORMAL LOW (ref 3.5–5.1)
Sodium: 138 mmol/L (ref 135–145)
Total Bilirubin: 0.3 mg/dL (ref 0.0–1.2)
Total Protein: 7.5 g/dL (ref 6.5–8.1)

## 2023-09-11 LAB — VITAMIN B12: Vitamin B-12: 5607 pg/mL — ABNORMAL HIGH (ref 180–914)

## 2023-09-11 LAB — CBC WITH DIFFERENTIAL (CANCER CENTER ONLY)
Abs Immature Granulocytes: 0.03 K/uL (ref 0.00–0.07)
Basophils Absolute: 0 K/uL (ref 0.0–0.1)
Basophils Relative: 0 %
Eosinophils Absolute: 0.1 K/uL (ref 0.0–0.5)
Eosinophils Relative: 1 %
HCT: 36.6 % (ref 36.0–46.0)
Hemoglobin: 12.3 g/dL (ref 12.0–15.0)
Immature Granulocytes: 0 %
Lymphocytes Relative: 15 %
Lymphs Abs: 1.2 K/uL (ref 0.7–4.0)
MCH: 32.9 pg (ref 26.0–34.0)
MCHC: 33.6 g/dL (ref 30.0–36.0)
MCV: 97.9 fL (ref 80.0–100.0)
Monocytes Absolute: 0.5 K/uL (ref 0.1–1.0)
Monocytes Relative: 6 %
Neutro Abs: 6.4 K/uL (ref 1.7–7.7)
Neutrophils Relative %: 78 %
Platelet Count: 383 K/uL (ref 150–400)
RBC: 3.74 MIL/uL — ABNORMAL LOW (ref 3.87–5.11)
RDW: 14.5 % (ref 11.5–15.5)
WBC Count: 8.1 K/uL (ref 4.0–10.5)
nRBC: 0 % (ref 0.0–0.2)

## 2023-09-11 LAB — IRON AND IRON BINDING CAPACITY (CC-WL,HP ONLY)
Iron: 41 ug/dL (ref 28–170)
Saturation Ratios: 11 % (ref 10.4–31.8)
TIBC: 392 ug/dL (ref 250–450)
UIBC: 351 ug/dL (ref 148–442)

## 2023-09-11 LAB — FERRITIN: Ferritin: 33 ng/mL (ref 11–307)

## 2023-09-11 MED ORDER — ZOLEDRONIC ACID 4 MG/100ML IV SOLN
4.0000 mg | Freq: Once | INTRAVENOUS | Status: AC
  Administered 2023-09-11: 4 mg via INTRAVENOUS
  Filled 2023-09-11: qty 100

## 2023-09-11 MED ORDER — B-12 1000 MCG SL SUBL: 1000.0000 ug | SUBLINGUAL_TABLET | Freq: Every day | SUBLINGUAL | 1 refills | Status: AC

## 2023-09-11 NOTE — Progress Notes (Signed)
 [facility] Cancer Follow up:    Debra Satterfield, MD [address] [place] [address]   DIAGNOSIS:  Cancer Staging  Ductal carcinoma in situ (DCIS) of left breast Staging form: Breast, AJCC 8th Edition - Pathologic: Stage 0 (pTis (DCIS), pN0, cM0) - Unsigned   SUMMARY OF ONCOLOGIC HISTORY: [place] woman:   1)  status post  right lumpectomy April 2014 for a low-grade ductal carcinoma in situ. Focally close to the posterior margin at 0.1 cm. ER +100%, PR +100%.   2) Status post radiation therapy under the care of Dr. Keenan, completed 08/23/2012   3) decided against antiestrogens June 2015   4)  multiple comorbidities including peripheral vascular disease with stenting of the left iliac artery; hypertension; claudication; and a history of renal artery stenosis, with stenting of the left renal artery.   5) right breast upper inner quadrant biopsy 06/17/2013 showed a complex sclerosing lesion   6) status post left breast upper outer quadrant biopsy 12/25/2016 showing ductal carcinoma in situ, low-grade, estrogen and progesterone receptor positive.   7) left lumpectomy 02/06/2017 ductal carcinoma in situ, grade 1, measuring 1.0 cm, with negative margins   8) adjuvant radiation 04/02/17-04/30/17  Site/dose: 1) Left breast/ 40.05 Gy in 15 fractions 2) Left breast boost/ 12 Gy in 6 fractions   9) anastrozole  started 06/01/2017             (a) bone density 07/15/2017 showed a T score of -2.7             (b) zoledronate started 12/24/2017, repeated yearly             (c) repeat bone density 12/02/2019 stable with T- 2.7    CURRENT THERAPY: Anastrozole /zoledronate  INTERVAL HISTORY: Debra Henderson 78 y.o. female returns for f/u and to discuss her results from her lab testing.   Discussed the use of AI scribe software for clinical note transcription with the patient, who gave verbal consent to proceed.  History of Present Illness  Debra Henderson is a 78 year old female with history of DCIS, anemia and osteoporosis who presents for follow-up on her anemia and bone health.  She has a history of anemia, which has been improving with hemoglobin levels increasing from 7.6 to 12.3. She feels tired but has no blood in her stool or melena. She attributes her improvement to 'diet pills' and reports feeling tired.  She has osteoporosis and is receiving treatment with Zometa  infusions. She will be getting her infusion after the appointment. No new dental problems have been reported, but she notes that her teeth 'just fall out,' which she believes is inherited. She has not seen a dentist recently due to a canceled appointment but typically visits every six months.  She has a history of a broken bone, which required hospitalization. Recovery was expected to take about a year, and she is currently able to walk.  She has discontinued olmesartan  due to low blood pressure and is seeking a prescription for vitamin B12, which she cannot obtain without a prescription. She has switched her pharmacy from [facility] to [facility] in [place].  Rest of the pertinent 10 point ROS reviewed and neg.   Patient Active Problem List   Diagnosis Date Noted   Iron deficiency anemia due to chronic blood loss 08/25/2023   History of colon polyps 08/25/2023   Closed fracture of left hip (HCC) 01/15/2023   Constipation 12/27/2022   Closed left femoral fracture (  HCC) 12/20/2022   Hyponatremia 12/20/2022   Hypokalemia 12/20/2022   Tobacco abuse 12/20/2022   B12 deficiency 09/21/2021   Osteoporosis 09/14/2017   Essential hypertension 02/04/2017   Mixed hyperlipidemia 02/04/2017   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  Left lower extremity. 02/25/2012     is allergic to tetracyclines & related, vytorin [ezetimibe-simvastatin], chicken allergy, egg-derived products, erythromycin, lipitor [atorvastatin ], penicillins, and sulfa antibiotics.  MEDICAL HISTORY: 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, ER/PR+   Cancer (HCC) 2014   breast cancer   Chronic kidney disease    renal artery stenosis   Claudication (HCC)    lower extremities   Complication of anesthesia    ' It does not work  I am difficult to put tpo sleep   Family history of anesthesia complication    my brother is difficult to put to sleep also   GERD (gastroesophageal reflux disease)    otc   History of renal stent    LEA DUPLEX, 03/16/2012 - LEFT EIA DISTAL/COMMON FEMORAL ARTERY-demonstrated occlusive disease   Hyperlipidemia    Hypertension    Incontinence    Peripheral vascular disease (HCC)    prior stenting of left iliac artery   Personal history of radiation therapy 2014   right breast   Personal history of radiation therapy 2019   left breast   Radiation 08/02/12-08/23/12   Right breast 42.72 Gy x 16 fx   Renal artery stenosis (HCC)    RENAL DOPPLER, 02/12/2011 - RIGHT RENAL ARTERY 60-99% diameter reduction, LEFT RENAL ARTERY AT STENT 60-99% diameter reduction    SURGICAL HISTORY: Past Surgical History:  Procedure Laterality Date   ANGIOPLASTY ILLIAC ARTERY  02/24/2012   Dr Court  L iliac restent   ATHERECTOMY N/A 02/24/2012   Procedure: ATHERECTOMY;  Surgeon: Dorn JINNY Court, MD;  Location: [facility] CATH LAB;  Service: Cardiovascular;  Laterality: N/A;   BLADDER SUSPENSION  1980's   bladder tack     BREAST BIOPSY Right 05/2013   BREAST BIOPSY Right 02/13/2012   BREAST BIOPSY Left 12/25/2016   BREAST LUMPECTOMY Right 05/02/2012   BREAST LUMPECTOMY Left 02/06/2017   BREAST LUMPECTOMY WITH NEEDLE LOCALIZATION Right 04/22/2012   Procedure: RIGHT BREAST WIRE LOCALIZATION  LUMPECTOMY ;  Surgeon:  Deward GORMAN Curvin DOUGLAS, MD;  Location: [facility] OR;  Service: General;  Laterality: Right;   BREAST LUMPECTOMY WITH RADIOACTIVE SEED LOCALIZATION Left 02/06/2017   Procedure: BREAST LUMPECTOMY WITH RADIOACTIVE SEED LOCALIZATION;  Surgeon: Curvin Deward DOUGLAS, MD;  Location: [facility];  Service: General;  Laterality: Left;   COLONOSCOPY     fibroid breast     left breast-adenoma benign 1980's   ILIAC ARTERY STENT  11/21/19/02   left    INTRAMEDULLARY (IM) NAIL INTERTROCHANTERIC Left 12/21/2022   Procedure: INTRAMEDULLARY (IM) NAIL INTERTROCHANTERIC;  Surgeon: Margrette Taft BRAVO, MD;  Location: [facility] ORS;  Service: Orthopedics;  Laterality: Left;   NM MYOVIEW  LTD  03/21/2010   normal myocardial perfusion study   PERIPHERAL VASCULAR CATHETERIZATION Left 02/24/2012   Common iliac artery, 8x38 iCast Stent, resulting in a reduction from 80% in-stent restenosis to 0% residual   PERIPHERAL VASCULAR CATHETERIZATION Left 08/04/2006   Renal 90% stenosis,  3.5x 13 drug-eluting stent resulting in a reduction of 90% in-stent restenosis to 0% residual   PERIPHERAL VASCULAR CATHETERIZATION Left 05/22/2005   Renal 80% in-stent stenosis, 5x15 Aviator resulting in a reduction of 80% in-stent restenosis to 0% residual   PERIPHERAL VASCULAR CATHETERIZATION Left 02/22/2002   Renal in-stent restenosis, 5x15 Guidant rapid-exchange balloon resulting in reduction of a 95% in-stent restenosis to less than 20% residual   PERIPHERAL VASCULAR CATHETERIZATION Left 12/10/2000   95% renal stenosis, 6x55mm Genesis Aviator balloon/stent deployed at 10 atmospheres resulting in a reduction  of 95% stenosis to 0% residual; Common iliac artery stenosis, P12x4 mounted on a 7x2 Powerflex balloon resulting in a reduction of 80% to 0% residual   PERIPHERAL VASCULAR CATHETERIZATION Left 07/27/2000   95% renal stenosis, 7mm x 6cm Smart stent resulting in a reduction of 90-95%  to 0% residual   stent /kidney  12/10/00,08/04/06   2002 R renal  artery, 2008 L renal   TUBAL LIGATION     1980's    SOCIAL HISTORY: Social History   Socioeconomic History   Marital status: Married    Spouse name: Not on file   Number of children: Not on file   Years of education: Not on file   Highest education level: Not on file  Occupational History   Not on file  Tobacco Use   Smoking status: Former    Current packs/day: 0.00    Types: Cigarettes    Quit date: 02/24/1988    Years since quitting: 35.5   Smokeless tobacco: Never   Tobacco comments:    quit 1990  Vaping Use   Vaping status: Never Used  Substance and Sexual Activity   Alcohol use: No   Drug use: No   Sexual activity: Never    Birth control/protection: Post-menopausal    Comment: menarche age 36, P50, menopause 80, no HRT  Other Topics Concern   Not on file  Social History Narrative   Not on file   Social Drivers of Health   Financial Resource Strain: Not on file  Food Insecurity: No Food Insecurity (12/20/2022)   Hunger Vital Sign    Worried About Running Out of Food in the Last Year: Never true    Ran Out of Food in the Last Year: Never true  Transportation Needs: No Transportation Needs (01/01/2023)   Received from [facility]   PRAPARE - Transportation    Lack of Transportation (Medical): No    Lack of Transportation (Non-Medical): No  Physical Activity: Not on file  Stress: Not on file  Social Connections: Unknown (06/04/2021)   [facility]   Social Network    Social Network: Not on file  Intimate Partner Violence: Not At Risk (12/20/2022)   Humiliation, Afraid, Rape, and Kick questionnaire    Fear of Current or Ex-Partner: No    Emotionally Abused: No    Physically Abused: No    Sexually Abused: No    FAMILY HISTORY: Family History  Problem Relation Age of Onset   Cancer Mother        colon   COPD Mother    Cancer Father        brain   Stroke Sister    Heart attack Sister    Breast cancer Neg Hx     Review of  Systems  Constitutional:  Positive for fatigue. Negative for appetite change, chills, fever and unexpected weight change.  HENT:   Negative for hearing loss, lump/mass and trouble  swallowing.   Eyes:  Negative for eye problems and icterus.  Respiratory:  Negative for chest tightness, cough and shortness of breath.   Cardiovascular:  Negative for chest pain, leg swelling and palpitations.  Gastrointestinal:  Negative for abdominal distention, abdominal pain, constipation, diarrhea, nausea and vomiting.  Endocrine: Negative for hot flashes.  Genitourinary:  Negative for difficulty urinating.   Musculoskeletal:  Negative for arthralgias.  Skin:  Negative for itching and rash.  Neurological:  Negative for dizziness, extremity weakness, headaches and numbness.  Hematological:  Negative for adenopathy. Does not bruise/bleed easily.  Psychiatric/Behavioral:  Negative for depression. The patient is not nervous/anxious.       PHYSICAL EXAMINATION  ECOG PERFORMANCE STATUS: 1 - Symptomatic but completely ambulatory  She is petite, no acute distress No cervical adenopathy Bilateral breasts inspected. No palpable masses. No regional adenopathy CTA bilaterally RRR No LE edema.    LABORATORY DATA:  CBC    Component Value Date/Time   WBC 7.1 12/24/2022 0414   RBC 2.39 (L) 12/24/2022 0414   HGB 7.6 (L) 12/24/2022 0414   HGB 11.0 (L) 12/12/2022 1537   HGB 13.4 10/03/2013 1504   HCT 23.4 (L) 12/24/2022 0414   HCT 40.2 10/03/2013 1504   PLT 284 12/24/2022 0414   PLT 367 12/12/2022 1537   PLT 277 10/03/2013 1504   MCV 97.9 12/24/2022 0414   MCV 98.0 10/03/2013 1504   MCH 31.8 12/24/2022 0414   MCHC 32.5 12/24/2022 0414   RDW 13.0 12/24/2022 0414   RDW 14.0 10/03/2013 1504   LYMPHSABS 1.3 12/20/2022 0317   LYMPHSABS 2.3 10/03/2013 1504   MONOABS 0.7 12/20/2022 0317   MONOABS 0.7 10/03/2013 1504   EOSABS 0.0 12/20/2022 0317   EOSABS 0.1 10/03/2013 1504   BASOSABS 0.0 12/20/2022 0317    BASOSABS 0.0 10/03/2013 1504    CMP     Component Value Date/Time   NA 129 (L) 12/24/2022 0414   NA 138 10/03/2013 1505   K 3.7 12/24/2022 0414   K 3.2 (L) 10/03/2013 1505   CL 97 (L) 12/24/2022 0414   CL 100 05/20/2012 1454   CO2 25 12/24/2022 0414   CO2 27 10/03/2013 1505   GLUCOSE 103 (H) 12/24/2022 0414   GLUCOSE 93 10/03/2013 1505   GLUCOSE 94 05/20/2012 1454   BUN 13 12/24/2022 0414   BUN 7.2 10/03/2013 1505   CREATININE 0.54 12/24/2022 0414   CREATININE 0.74 09/11/2022 1335   CREATININE 0.8 10/03/2013 1505   CALCIUM  8.4 (L) 12/24/2022 0414   CALCIUM  9.5 10/03/2013 1505   PROT 5.8 (L) 12/21/2022 0420   PROT 7.5 10/03/2013 1505   ALBUMIN 2.9 (L) 12/21/2022 0420   ALBUMIN 3.5 10/03/2013 1505   AST 15 12/21/2022 0420   AST 10 (L) 09/11/2022 1335   AST 11 10/03/2013 1505   ALT 9 12/21/2022 0420   ALT 7 09/11/2022 1335   ALT 9 10/03/2013 1505   ALKPHOS 43 12/21/2022 0420   ALKPHOS 76 10/03/2013 1505   BILITOT 0.6 12/21/2022 0420   BILITOT 0.3 09/11/2022 1335   BILITOT 0.31 10/03/2013 1505   GFRNONAA >60 12/24/2022 0414   GFRNONAA >60 09/11/2022 1335   GFRAA >60 12/23/2018 1353      ASSESSMENT and THERAPY PLAN:   Assessment and Plan Assessment & Plan  Ductal carcinoma in situ (DCIS) of left breast She will continue on annual observation.  She completed 5 yrs of anastrozole . Mammogram Sep 2024, neg for recurrence. No concern on exam today  Anemia Anemia improved from severe to normal hemoglobin levels (12.3). No signs of gastrointestinal bleeding. Reports fatigue.  Osteoporosis Osteoporosis managed with Zometa . No new dental issues, she lost some teeth which claims as aging and hereditary Zometa  may cause dental problems. - Administer Zometa  infusion. - Advise dental check-up to rule out Zometa -related dental issues. She expressed understanding.  Vitamin B12 deficiency Vitamin B12 deficiency, requested a refill Sent to her pharmacy of choice.

## 2023-09-11 NOTE — Patient Instructions (Signed)

## 2023-09-14 ENCOUNTER — Ambulatory Visit: Payer: Self-pay | Admitting: Hematology and Oncology

## 2023-09-14 NOTE — Telephone Encounter (Signed)
 Called pt and she reports she has been taking [place] B12 5,000 mcg daily. Advised pt we sent a refill for the B12 1,000 8/22. She was advised to hold B12 for one week considering the high levels and resume the B12 we prescribed in one week. She verbalized agreement and understanding.

## 2023-09-14 NOTE — Telephone Encounter (Signed)
-----   Message from Erath Iruku sent at 09/14/2023  8:51 AM EDT ----- Leita  Can you verify about her B12 supplementation, levels are too high.  Thanks, ----- Message ----- From: Rebecka, Lab In [place] Sent: 09/11/2023   1:00 PM EDT To: Amber Stalls, MD

## 2023-09-22 DIAGNOSIS — E782 Mixed hyperlipidemia: Secondary | ICD-10-CM | POA: Diagnosis not present

## 2023-09-22 DIAGNOSIS — Z681 Body mass index (BMI) 19 or less, adult: Secondary | ICD-10-CM | POA: Diagnosis not present

## 2023-09-22 DIAGNOSIS — M810 Age-related osteoporosis without current pathological fracture: Secondary | ICD-10-CM | POA: Diagnosis not present

## 2023-09-22 DIAGNOSIS — K589 Irritable bowel syndrome without diarrhea: Secondary | ICD-10-CM | POA: Diagnosis not present

## 2023-09-22 DIAGNOSIS — M779 Enthesopathy, unspecified: Secondary | ICD-10-CM | POA: Diagnosis not present

## 2023-09-22 DIAGNOSIS — I10 Essential (primary) hypertension: Secondary | ICD-10-CM | POA: Diagnosis not present

## 2023-10-02 ENCOUNTER — Ambulatory Visit
Admission: RE | Admit: 2023-10-02 | Discharge: 2023-10-02 | Disposition: A | Discharge status: Home / Self Care | Source: Ambulatory Visit | Attending: Hematology and Oncology

## 2023-10-02 DIAGNOSIS — D0512 Intraductal carcinoma in situ of left breast: Secondary | ICD-10-CM

## 2023-10-15 ENCOUNTER — Ambulatory Visit
Admission: RE | Admit: 2023-10-15 | Discharge: 2023-10-15 | Disposition: A | Discharge status: Home / Self Care | Source: Ambulatory Visit | Attending: Hematology and Oncology | Admitting: Hematology and Oncology

## 2023-10-15 ENCOUNTER — Inpatient Hospital Stay: Admission: RE | Admit: 2023-10-15 | Source: Ambulatory Visit

## 2023-10-15 DIAGNOSIS — Z1231 Encounter for screening mammogram for malignant neoplasm of breast: Secondary | ICD-10-CM | POA: Diagnosis not present

## 2023-11-17 ENCOUNTER — Ambulatory Visit (INDEPENDENT_AMBULATORY_CARE_PROVIDER_SITE_OTHER): Admitting: Gastroenterology

## 2023-11-17 ENCOUNTER — Encounter (INDEPENDENT_AMBULATORY_CARE_PROVIDER_SITE_OTHER): Payer: Self-pay | Admitting: Gastroenterology

## 2023-11-17 VITALS — BP 106/70 | HR 96 | Temp 97.2°F | Ht <= 58 in | Wt 88.6 lb

## 2023-11-17 DIAGNOSIS — R6881 Early satiety: Secondary | ICD-10-CM | POA: Insufficient documentation

## 2023-11-17 DIAGNOSIS — D500 Iron deficiency anemia secondary to blood loss (chronic): Secondary | ICD-10-CM | POA: Diagnosis not present

## 2023-11-17 DIAGNOSIS — Z860100 Personal history of colon polyps, unspecified: Secondary | ICD-10-CM | POA: Diagnosis not present

## 2023-11-17 NOTE — Progress Notes (Signed)
 Faizan Patrici Minnis , M.D. Gastroenterology & Hepatology [facility] [address] [place], [place] [address] Primary Care Physician: Bertell Satterfield, MD [address] [place] [address]  Chief Complaint: Early satiety, history of iron deficiency History of Present Illness:  Debra Henderson is a 78 y.o. female with hypertension, hyperlipidemia, peripheral vascular disease on Plavix  who presents for evaluation of iron deficiency anemia and early satiety   Patient was previously seen 08/2023 for iron deficiency anemia.  She was advised upper endoscopy colonoscopy which she refused.  Today patient reports early satiety as she feels poorly after few bites of eating food. The patient denies having any nausea, vomiting, fever, chills, hematochezia, melena, hematemesis, abdominal distention, abdominal pain, diarrhea, jaundice, pruritus  Normal folate and vitamin B12 levels  Last ZHI:wnwz Last Colonoscopy:2019  - The perianal and digital rectal examinations were normal. - Scattered medium- mouthed diverticula were found in the sigmoid colon and ascending colon. - A 5 mm polyp was found in the cecum. The polyp was sessile. The polyp was removed with a cold snare. Resection and retrieval were complete. - A single small angiodysplastic lesion was found in the ascending colon. - Three sessile polyps were found in the transverse colon. The polyps were 4 to 10 mm in size. These polyps were removed with a cold snare. Resection and retrieval were complete. - A 8 mm polyp was found in the sigmoid colon. The polyp was sessile. The polyp was removed with a cold snare. Resection and retrieval were complete. - An area of mucosa was found in the sigmoid colon - protuberant slightly inflamed adjacent to a diverticulum. Suspect inflammatory changes related to diverticulosis but biopsies were taken with a cold forceps for histology to ensure no adenomatous  change. - Internal hemorrhoids were found during retroflexion. The hemorrhoids were large.  - The colon was tortuous which prolonged the procedure. the patient did not retained air well in the left colon. - The exam was otherwise without abnormality.  1. Surgical [P], sigmoid, transverse and cecum, polyp (5) - TUBULAR ADENOMA(S). - HIGH GRADE DYSPLASIA IS NOT IDENTIFIED. 2. Surgical [P], sigmoid polyp - BENIGN POLYPOID COLORECTAL MUCOSA. - THERE IS NO EVIDENCE OF MALIGNANCY. 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, ER/PR+   Cancer (HCC) 2014   breast cancer   Chronic kidney disease    renal artery stenosis   Claudication    lower extremities   Complication of anesthesia    ' It does not work  I am difficult to put tpo sleep   Family history of anesthesia complication    my brother is difficult to put to sleep also   GERD (gastroesophageal reflux disease)    otc   History of renal stent    LEA DUPLEX, 03/16/2012 - LEFT EIA DISTAL/COMMON FEMORAL ARTERY-demonstrated occlusive disease   Hyperlipidemia    Hypertension    Incontinence    Peripheral vascular disease    prior stenting of left iliac artery   Personal history of radiation therapy 2014   right breast   Personal history of radiation therapy 2019   left breast   Radiation 08/02/12-08/23/12   Right breast 42.72 Gy x 16 fx   Renal artery  stenosis    RENAL DOPPLER, 02/12/2011 - RIGHT RENAL ARTERY 60-99% diameter reduction, LEFT RENAL ARTERY AT STENT 60-99% diameter reduction    Past Surgical History: Past Surgical History:  Procedure Laterality Date   ANGIOPLASTY ILLIAC ARTERY  02/24/2012   Dr Court  L iliac restent   ATHERECTOMY N/A 02/24/2012   Procedure: ATHERECTOMY;  Surgeon:  Dorn JINNY Court, MD;  [facility] CATH LAB;  Service: Cardiovascular;  Laterality: N/A;   BLADDER SUSPENSION  1980's   bladder tack     BREAST BIOPSY Right 05/2013   BREAST BIOPSY Right 02/13/2012   BREAST BIOPSY Left 12/25/2016   BREAST LUMPECTOMY Right 05/02/2012   BREAST LUMPECTOMY Left 02/06/2017   BREAST LUMPECTOMY WITH NEEDLE LOCALIZATION Right 04/22/2012   Procedure: RIGHT BREAST WIRE LOCALIZATION  LUMPECTOMY ;  Surgeon: Deward GORMAN Curvin DOUGLAS, MD;  Location: [facility] OR;  Service: General;  Laterality: Right;   BREAST LUMPECTOMY WITH RADIOACTIVE SEED LOCALIZATION Left 02/06/2017   Procedure: BREAST LUMPECTOMY WITH RADIOACTIVE SEED LOCALIZATION;  Surgeon: Curvin Deward DOUGLAS, MD;  Location:  SURGERY CENTER;  Service: General;  Laterality: Left;   COLONOSCOPY     fibroid breast     left breast-adenoma benign 1980's   ILIAC ARTERY STENT  11/21/19/02   left    INTRAMEDULLARY (IM) NAIL INTERTROCHANTERIC Left 12/21/2022   Procedure: INTRAMEDULLARY (IM) NAIL INTERTROCHANTERIC;  Surgeon: Margrette Taft BRAVO, MD;  Location: [facility] ORS;  Service: Orthopedics;  Laterality: Left;   NM MYOVIEW  LTD  03/21/2010   normal myocardial perfusion study   PERIPHERAL VASCULAR CATHETERIZATION Left 02/24/2012   Common iliac artery, 8x38 iCast Stent, resulting in a reduction from 80% in-stent restenosis to 0% residual   PERIPHERAL VASCULAR CATHETERIZATION Left 08/04/2006   Renal 90% stenosis, 3.5x 13 drug-eluting stent resulting in a reduction of 90% in-stent restenosis to 0% residual   PERIPHERAL VASCULAR CATHETERIZATION Left 05/22/2005   Renal 80% in-stent stenosis, 5x15 Aviator resulting in a reduction of 80% in-stent restenosis to 0% residual   PERIPHERAL VASCULAR CATHETERIZATION Left 02/22/2002   Renal in-stent restenosis, 5x15 Guidant rapid-exchange balloon resulting in reduction of a 95% in-stent restenosis to less than 20% residual   PERIPHERAL VASCULAR CATHETERIZATION Left 12/10/2000   95% renal stenosis,  6x52mm Genesis Aviator balloon/stent deployed at 10 atmospheres resulting in a reduction  of 95% stenosis to 0% residual; Common iliac artery stenosis, P12x4 mounted on a 7x2 Powerflex balloon resulting in a reduction of 80% to 0% residual   PERIPHERAL VASCULAR CATHETERIZATION Left 07/27/2000   95% renal stenosis, 7mm x 6cm Smart stent resulting in a reduction of 90-95%  to 0% residual   stent /kidney  12/10/00,08/04/06   2002 R renal artery, 2008 L renal   TUBAL LIGATION     1980's    Family History: Family History  Problem Relation Age of Onset   Cancer Mother        colon   COPD Mother    Cancer Father        brain   Stroke Sister    Heart attack Sister    Breast cancer Neg Hx     Social History: Social History   Tobacco Use  Smoking Status Former   Current packs/day: 0.00   Types: Cigarettes   Quit date: 02/24/1988   Years since quitting: 35.7  Smokeless Tobacco Never  Tobacco Comments   quit 1990   Social History   Substance and Sexual Activity  Alcohol Use No   Social History  Substance and Sexual Activity  Drug Use No    Allergies: Allergies  Allergen Reactions   Tetracyclines & Related Nausea And Vomiting and Other (See Comments)    Vomiting blood   Vytorin [Ezetimibe-Simvastatin] Other (See Comments)    Per Dr JINNY Lesches note   Chicken Allergy Diarrhea, Rash and Other (See Comments)   Egg Protein-Containing Drug Products Diarrhea, Rash and Other (See Comments)   Erythromycin Rash   Lipitor [Atorvastatin ] Rash and Other (See Comments)    Myalgias    Penicillins Rash, Swelling and Other (See Comments)   Sulfa Antibiotics Swelling and Rash    Medications: Current Outpatient Medications  Medication Sig Dispense Refill   acetaminophen  (TYLENOL ) 325 MG tablet Take 2 tablets (650 mg total) by mouth every 6 (six) hours as needed for mild pain (pain score 1-3) (or Fever >/= 101). 30 tablet 0   albuterol  (VENTOLIN  HFA) 108 (90 Base) MCG/ACT inhaler Inhale  2 puffs into the lungs every 4 (four) hours.     cholecalciferol (VITAMIN D3) 25 MCG (1000 UNIT) tablet Take 1,000 Units by mouth daily.     clopidogrel  (PLAVIX ) 75 MG tablet TAKE1 TABLET EVERY DAY  3   Cyanocobalamin  (B-12) 1000 MCG SUBL Place 1,000 mcg under the tongue daily. Start 09/15/22 30 tablet 1   ferrous sulfate 325 (65 FE) MG tablet Take 325 mg by mouth 3 (three) times daily.     furosemide (LASIX) 20 MG tablet Take 20 mg by mouth daily as needed.     omega-3 acid ethyl esters (LOVAZA ) 1 G capsule Take 1 g by mouth 3 (three) times daily.     pravastatin  (PRAVACHOL ) 10 MG tablet Take 10 mg by mouth daily.     Vitamin D, Ergocalciferol, (DRISDOL) 1.25 MG (50000 UNIT) CAPS capsule Take 50,000 Units by mouth once a week.     No current facility-administered medications for this visit.    Review of Systems: GENERAL: negative for malaise, night sweats HEENT: No changes in hearing or vision, no nose bleeds or other nasal problems. NECK: Negative for lumps, goiter, pain and significant neck swelling RESPIRATORY: Negative for cough, wheezing CARDIOVASCULAR: Negative for chest pain, leg swelling, palpitations, orthopnea GI: SEE HPI MUSCULOSKELETAL: Negative for joint pain or swelling, back pain, and muscle pain. SKIN: Negative for lesions, rash HEMATOLOGY Negative for prolonged bleeding, bruising easily, and swollen nodes. ENDOCRINE: Negative for cold or heat intolerance, polyuria, polydipsia and goiter. NEURO: negative for tremor, gait imbalance, syncope and seizures. The remainder of the review of systems is noncontributory.   Physical Exam: BP 106/70   Pulse 96   Temp (!) 97.2 F (36.2 C)   Ht 4' 10 (1.473 m)   Wt 88 lb 9.6 oz (40.2 kg)   BMI 18.52 kg/m  GENERAL: The patient is AO x3, in no acute distress. HEENT: Head is normocephalic and atraumatic. EOMI are intact. Mouth is well hydrated and without lesions. NECK: Supple. No masses LUNGS: Clear to auscultation. No  presence of rhonchi/wheezing/rales. Adequate chest expansion HEART: RRR, normal s1 and s2. ABDOMEN: Soft, nontender, no guarding, no peritoneal signs, and nondistended. BS +. No masses.  Imaging/Labs: as above     Latest Ref Rng & Units 09/11/2023   12:43 PM 12/24/2022    4:14 AM 12/22/2022    9:42 AM  CBC  WBC 4.0 - 10.5 K/uL 8.1  7.1  12.2   Hemoglobin 12.0 - 15.0 g/dL 87.6  7.6  8.2   Hematocrit 36.0 - 46.0 %  36.6  23.4  24.7   Platelets 150 - 400 K/uL 383  284  239    Lab Results  Component Value Date   IRON 41 09/11/2023   TIBC 392 09/11/2023   FERRITIN 33 09/11/2023    I personally reviewed and interpreted the available labs, imaging and endoscopic files.  Impression and Plan:  NEFERTARI REBMAN is a 78 y.o. female with hypertension, hyperlipidemia, peripheral vascular disease on Plavix  who presents for evaluation of iron deficiency anemia  #Early Satiety  #Iron deficiency anemia #Colon polyps   hemoglobin 10 MCV 102 iron saturation 38 previously had a ferritin of 26 November 2022---> after iron supplementation ferritin has improved to 33 and percent saturation 11%.  Ferritin less than 45 is still considered iron deficiency  No overt GI bleed.  Last colonoscopy 2019 with tubular adenomas suggest repeat was 3 years  As per ACG guidelines , bidirectional endoscopy is recommended over iron replacement therapy only.  I offered patient appropriate next workup including upper endoscopy with small bowel biopsies and Colonoscopy , to ensure were not dealing with advanced polyp ulceration or malignancy, especially with new onset of bloody satiety. I discussed with patient with without upper endoscopy and colonoscopy unable to rule out lesions such as malignancy   She will think about at least pursuing upper endoscopy for new upper GI symptoms and will follow-up with us  in the clinic again to rediscuss endoscopic evaluation.  Patient absolutely refusing further colonoscopy.  All  questions were answered.      Debra Spells Faizan Ko Bardon, MD Gastroenterology and Hepatology [facility]   This chart has been completed using [facility] Dictation software, and while attempts have been made to ensure accuracy , certain words and phrases may not be transcribed as intended

## 2024-01-22 ENCOUNTER — Telehealth: Payer: Self-pay | Admitting: Hematology and Oncology

## 2024-01-22 NOTE — Telephone Encounter (Signed)
 I spoke with patient and she is aware of rescheduled appointments from 09/12/2024 to 09/13/2024 due to MD admin day change.

## 2024-01-25 ENCOUNTER — Telehealth: Payer: Self-pay | Admitting: Adult Health

## 2024-01-25 ENCOUNTER — Telehealth: Payer: Self-pay | Admitting: *Deleted

## 2024-01-25 NOTE — Telephone Encounter (Signed)
 Per Dr. Loretha, called pt about message below, and pt states that the area has been there for months now. There's no pain or redness in the area. She could not recall if has increased in size or not. Recommended that she come to see our provider or APP. She stated that she could not due morning appts. Earliest available was with APP on 1/26 at 120pm. Pt was ok with appt and verbalized understanding.

## 2024-01-25 NOTE — Telephone Encounter (Signed)
 I left a voicemail for patient to schedule visit with NP regarding breast concern.

## 2024-01-25 NOTE — Telephone Encounter (Signed)
-----   Message from Amber Stalls, MD sent at 01/22/2024  4:00 PM EST ----- Regarding: RE: Patient Concern Case [place]  Please talk to her and find out if this is a new change? She had a procedure done on her right breast in 2014 so if its chronic then its less worrisome. If its new, offer her an appointment with me or APP next available.  Thanks, ----- Message ----- From: Trudy Gain Sent: 01/22/2024   1:52 PM EST To: Rudean LITTIE Franks, LPN; Amber Stalls, MD Subject: Patient Concern                                Hello,  I spoke with this patient to reschedule an appointment and she stated she does not have a PCP at this time to ask this question to. She wants to know if it's normal for the top of her right breast to be hard. No accompanying pain. Please advise when available.  Thank you.

## 2024-01-26 ENCOUNTER — Telehealth: Payer: Self-pay | Admitting: Adult Health

## 2024-02-15 ENCOUNTER — Inpatient Hospital Stay: Admitting: Adult Health

## 2024-02-23 ENCOUNTER — Ambulatory Visit: Admitting: Cardiology

## 2024-03-04 ENCOUNTER — Inpatient Hospital Stay: Admitting: Adult Health

## 2024-09-12 ENCOUNTER — Ambulatory Visit

## 2024-09-12 ENCOUNTER — Ambulatory Visit: Admitting: Hematology and Oncology

## 2024-09-12 ENCOUNTER — Other Ambulatory Visit

## 2024-09-13 ENCOUNTER — Inpatient Hospital Stay

## 2024-09-13 ENCOUNTER — Inpatient Hospital Stay: Admitting: Hematology and Oncology
